# Patient Record
Sex: Male | Born: 1945
Health system: Southern US, Community
[De-identification: ages and names within clinical notes are randomized; demographics above are authoritative.]

## PROBLEM LIST (undated history)

## (undated) DIAGNOSIS — G459 Transient cerebral ischemic attack, unspecified: Secondary | ICD-10-CM

## (undated) DIAGNOSIS — E119 Type 2 diabetes mellitus without complications: Secondary | ICD-10-CM

## (undated) DIAGNOSIS — I10 Essential (primary) hypertension: Secondary | ICD-10-CM

## (undated) DIAGNOSIS — E785 Hyperlipidemia, unspecified: Secondary | ICD-10-CM

## (undated) DIAGNOSIS — E079 Disorder of thyroid, unspecified: Secondary | ICD-10-CM

## (undated) DIAGNOSIS — I499 Cardiac arrhythmia, unspecified: Secondary | ICD-10-CM

## (undated) HISTORY — DX: Type 2 diabetes mellitus without complications: E11.9

## (undated) HISTORY — PX: APPENDECTOMY: SHX54

## (undated) HISTORY — DX: Essential (primary) hypertension: I10

## (undated) HISTORY — PX: CHOLECYSTECTOMY: SHX55

## (undated) HISTORY — DX: Transient cerebral ischemic attack, unspecified: G45.9

## (undated) HISTORY — DX: Disorder of thyroid, unspecified: E07.9

## (undated) HISTORY — DX: Cardiac arrhythmia, unspecified: I49.9

## (undated) HISTORY — DX: Hyperlipidemia, unspecified: E78.5

---

## 1998-06-12 ENCOUNTER — Encounter: Payer: Self-pay | Admitting: Emergency Medicine

## 1998-06-12 ENCOUNTER — Observation Stay (HOSPITAL_COMMUNITY): Admission: EM | Admit: 1998-06-12 | Discharge: 1998-06-13 | Payer: Self-pay | Admitting: Emergency Medicine

## 1998-06-18 ENCOUNTER — Encounter: Admission: RE | Admit: 1998-06-18 | Discharge: 1998-06-18 | Payer: Self-pay | Admitting: Family Medicine

## 2002-05-19 ENCOUNTER — Ambulatory Visit (HOSPITAL_COMMUNITY): Admission: RE | Admit: 2002-05-19 | Discharge: 2002-05-19 | Payer: Self-pay | Admitting: Gastroenterology

## 2005-01-07 ENCOUNTER — Ambulatory Visit: Payer: Self-pay | Admitting: Cardiology

## 2008-08-18 LAB — HM COLONOSCOPY

## 2010-07-08 ENCOUNTER — Encounter: Payer: Self-pay | Admitting: Family Medicine

## 2010-07-08 DIAGNOSIS — I499 Cardiac arrhythmia, unspecified: Secondary | ICD-10-CM

## 2010-07-08 DIAGNOSIS — G459 Transient cerebral ischemic attack, unspecified: Secondary | ICD-10-CM | POA: Insufficient documentation

## 2010-07-08 DIAGNOSIS — I10 Essential (primary) hypertension: Secondary | ICD-10-CM | POA: Insufficient documentation

## 2010-07-08 DIAGNOSIS — N400 Enlarged prostate without lower urinary tract symptoms: Secondary | ICD-10-CM | POA: Insufficient documentation

## 2010-07-08 DIAGNOSIS — E785 Hyperlipidemia, unspecified: Secondary | ICD-10-CM

## 2012-07-08 ENCOUNTER — Other Ambulatory Visit (INDEPENDENT_AMBULATORY_CARE_PROVIDER_SITE_OTHER): Payer: BC Managed Care – PPO

## 2012-07-08 DIAGNOSIS — E559 Vitamin D deficiency, unspecified: Secondary | ICD-10-CM

## 2012-07-08 DIAGNOSIS — E119 Type 2 diabetes mellitus without complications: Secondary | ICD-10-CM

## 2012-07-08 DIAGNOSIS — I10 Essential (primary) hypertension: Secondary | ICD-10-CM

## 2012-07-08 DIAGNOSIS — E785 Hyperlipidemia, unspecified: Secondary | ICD-10-CM

## 2012-07-08 DIAGNOSIS — R5381 Other malaise: Secondary | ICD-10-CM

## 2012-07-08 DIAGNOSIS — Z1212 Encounter for screening for malignant neoplasm of rectum: Secondary | ICD-10-CM

## 2012-07-08 LAB — POCT CBC
Granulocyte percent: 62.9 %G (ref 37–80)
Lymph, poc: 2.1 (ref 0.6–3.4)
MCV: 86.6 fL (ref 80–97)
Platelet Count, POC: 323 10*3/uL (ref 142–424)
RDW, POC: 13.2 %
WBC: 6.6 10*3/uL (ref 4.6–10.2)

## 2012-07-08 LAB — HEPATIC FUNCTION PANEL
Albumin: 4.4 g/dL (ref 3.5–5.2)
Total Bilirubin: 0.7 mg/dL (ref 0.3–1.2)

## 2012-07-08 LAB — BASIC METABOLIC PANEL
BUN: 20 mg/dL (ref 6–23)
Calcium: 9.5 mg/dL (ref 8.4–10.5)
Glucose, Bld: 145 mg/dL — ABNORMAL HIGH (ref 70–99)
Sodium: 138 mEq/L (ref 135–145)

## 2012-07-08 NOTE — Progress Notes (Signed)
Pt for labs only

## 2012-07-09 LAB — FECAL OCCULT BLOOD, IMMUNOCHEMICAL: Fecal Occult Blood: NEGATIVE

## 2012-07-09 LAB — VITAMIN D 25 HYDROXY (VIT D DEFICIENCY, FRACTURES): Vit D, 25-Hydroxy: 50 ng/mL (ref 30–89)

## 2012-07-09 LAB — NMR, LIPOPROFILE

## 2012-07-19 ENCOUNTER — Encounter: Payer: Self-pay | Admitting: Family Medicine

## 2012-08-02 ENCOUNTER — Ambulatory Visit (INDEPENDENT_AMBULATORY_CARE_PROVIDER_SITE_OTHER): Payer: BC Managed Care – PPO | Admitting: Family Medicine

## 2012-08-02 ENCOUNTER — Encounter: Payer: Self-pay | Admitting: Family Medicine

## 2012-08-02 VITALS — BP 132/81 | HR 88 | Temp 97.0°F | Ht 70.0 in | Wt 184.6 lb

## 2012-08-02 DIAGNOSIS — E119 Type 2 diabetes mellitus without complications: Secondary | ICD-10-CM

## 2012-08-02 DIAGNOSIS — N400 Enlarged prostate without lower urinary tract symptoms: Secondary | ICD-10-CM

## 2012-08-02 DIAGNOSIS — E1169 Type 2 diabetes mellitus with other specified complication: Secondary | ICD-10-CM | POA: Insufficient documentation

## 2012-08-02 DIAGNOSIS — E785 Hyperlipidemia, unspecified: Secondary | ICD-10-CM

## 2012-08-02 DIAGNOSIS — I10 Essential (primary) hypertension: Secondary | ICD-10-CM

## 2012-08-02 MED ORDER — VALSARTAN-HYDROCHLOROTHIAZIDE 320-25 MG PO TABS: 1.0000 | ORAL_TABLET | Freq: Every day | ORAL | Status: DC

## 2012-08-02 MED ORDER — EZETIMIBE 10 MG PO TABS: 10.0000 mg | ORAL_TABLET | Freq: Every day | ORAL | Status: DC

## 2012-08-02 NOTE — Patient Instructions (Addendum)
Study continue current meds and therapy lifestyle changes Fall prevention Monitor blood sugars and blood pressure at home

## 2012-08-02 NOTE — Progress Notes (Signed)
  Subjective:    Patient ID: Matthew Daugherty, male    DOB: 09-08-1945, 67 y.o.   MRN: [id number]  HPI This patient presents for recheck of multiple medical problems. No one accompanies the patient today.  Patient Active Problem List  Diagnosis  . Essential hypertension, benign  . Cardiac dysrhythmia, unspecified  . BPH (benign prostatic hyperplasia)  . Hyperlipemia    In addition,See ROS  The allergies, current medications, past medical history, surgical history, family and social history are reviewed.  Immunizations reviewed.  Health maintenance reviewed.  The following items are outstanding: none.      Review of Systems  Constitutional: Negative.   HENT: Negative.   Eyes: Negative.   Respiratory: Negative.   Cardiovascular: Negative.   Gastrointestinal: Negative.   Endocrine: Negative.   Genitourinary: Negative.   Musculoskeletal: Negative.   Skin: Negative.   Allergic/Immunologic: Positive for environmental allergies (sesonal).  Neurological: Negative.   Psychiatric/Behavioral: Negative.        Objective:   Physical Exam BP 132/81  Pulse 88  Temp(Src) 97 F (36.1 C) (Oral)  Ht 5\' 10"  (1.778 m)  Wt 184 lb 9.6 oz (83.734 kg)  BMI 26.49 kg/m2  The patient appeared well nourished and normally developed, alert and oriented to time and place. Speech, behavior and judgement appear normal. Vital signs as documented.  Head exam is unremarkable. No scleral icterus or pallor noted.  Neck is without jugular venous distension, thyromegally, or carotid bruits. Carotid upstrokes are brisk bilaterally. No cervical adenopathy. Lungs are clear anteriorly and posteriorly to auscultation. Normal respiratory effort. Cardiac exam reveals regular rate and rhythm. First and second heart sounds normal. No murmurs, rubs or gallops.  Abdominal exam reveals normal bowl sounds, no masses, no organomegaly and no aortic enlargement. No inguinal adenopathy. Prostate gland slightly  enlarged. No lumps. No rectal masses. No testicular masses. No hernia. Extremities are nonedematous and both femoral and pedal pulses are normal. Skin without pallor or jaundice.  Warm and dry, without rash. Neurologic exam reveals normal deep tendon reflexes and normal sensation.          Assessment & Plan:  1. Hyperlipemia  2. Essential hypertension, benign  3. BPH (benign prostatic hyperplasia) - PSA  4. Diabetes Mellitus --Good control  5. Return to clinic in 4 months --Labs prior to the visit

## 2012-08-03 LAB — PSA: PSA: 1.57 ng/mL (ref <=4.00)

## 2012-11-03 ENCOUNTER — Other Ambulatory Visit (INDEPENDENT_AMBULATORY_CARE_PROVIDER_SITE_OTHER): Payer: BC Managed Care – PPO

## 2012-11-03 DIAGNOSIS — R5381 Other malaise: Secondary | ICD-10-CM

## 2012-11-03 DIAGNOSIS — R7309 Other abnormal glucose: Secondary | ICD-10-CM

## 2012-11-03 DIAGNOSIS — E559 Vitamin D deficiency, unspecified: Secondary | ICD-10-CM

## 2012-11-03 DIAGNOSIS — I10 Essential (primary) hypertension: Secondary | ICD-10-CM

## 2012-11-03 DIAGNOSIS — E785 Hyperlipidemia, unspecified: Secondary | ICD-10-CM

## 2012-11-03 DIAGNOSIS — R5383 Other fatigue: Secondary | ICD-10-CM

## 2012-11-03 LAB — HEPATIC FUNCTION PANEL
ALT: 42 U/L (ref 0–53)
AST: 36 U/L (ref 0–37)
Alkaline Phosphatase: 44 U/L (ref 39–117)
Bilirubin, Direct: 0.2 mg/dL (ref 0.0–0.3)
Total Bilirubin: 0.8 mg/dL (ref 0.3–1.2)

## 2012-11-03 LAB — BASIC METABOLIC PANEL WITH GFR
CO2: 25 mEq/L (ref 19–32)
Chloride: 101 mEq/L (ref 96–112)
Creat: 1.2 mg/dL (ref 0.50–1.35)
Glucose, Bld: 129 mg/dL — ABNORMAL HIGH (ref 70–99)

## 2012-11-03 LAB — POCT CBC
HCT, POC: 37.8 % — AB (ref 43.5–53.7)
Hemoglobin: 13.8 g/dL — AB (ref 14.1–18.1)
MCH, POC: 32.2 pg — AB (ref 27–31.2)
MPV: 7.8 fL (ref 0–99.8)
POC LYMPH PERCENT: 31.3 %L (ref 10–50)
RBC: 4.3 M/uL — AB (ref 4.69–6.13)

## 2012-11-04 LAB — NMR LIPOPROFILE WITH LIPIDS
Cholesterol, Total: 94 mg/dL (ref <200)
Large HDL-P: 3.1 umol/L — ABNORMAL LOW (ref >=4.8)
Large VLDL-P: 2.7 nmol/L (ref <=2.7)
Triglycerides: 75 mg/dL (ref <150)

## 2012-11-07 NOTE — Addendum Note (Signed)
Addended by: Monica Becton on: 11/07/2012 06:56 PM   Modules accepted: Orders

## 2012-11-08 LAB — POCT GLYCOSYLATED HEMOGLOBIN (HGB A1C): Hemoglobin A1C: 6.7

## 2012-11-09 ENCOUNTER — Telehealth: Payer: Self-pay | Admitting: *Deleted

## 2012-11-09 NOTE — Telephone Encounter (Signed)
Message copied by Bearl Mulberry on Wed Nov 09, 2012  3:18 PM ------      Message from: Ernestina Penna      Created: Tue Nov 08, 2012  4:20 PM       Hemoglobin A1c is 6.7% and this indicates good blood sugar control. ------

## 2012-11-09 NOTE — Telephone Encounter (Signed)
Pt notified of results

## 2012-12-05 ENCOUNTER — Encounter: Payer: Self-pay | Admitting: Family Medicine

## 2012-12-05 ENCOUNTER — Ambulatory Visit (INDEPENDENT_AMBULATORY_CARE_PROVIDER_SITE_OTHER): Payer: BC Managed Care – PPO | Admitting: Family Medicine

## 2012-12-05 VITALS — BP 135/79 | HR 80 | Temp 97.6°F | Ht 70.0 in | Wt 184.2 lb

## 2012-12-05 DIAGNOSIS — E039 Hypothyroidism, unspecified: Secondary | ICD-10-CM | POA: Insufficient documentation

## 2012-12-05 DIAGNOSIS — I10 Essential (primary) hypertension: Secondary | ICD-10-CM

## 2012-12-05 DIAGNOSIS — R5381 Other malaise: Secondary | ICD-10-CM

## 2012-12-05 DIAGNOSIS — E785 Hyperlipidemia, unspecified: Secondary | ICD-10-CM

## 2012-12-05 DIAGNOSIS — E119 Type 2 diabetes mellitus without complications: Secondary | ICD-10-CM

## 2012-12-05 DIAGNOSIS — R5383 Other fatigue: Secondary | ICD-10-CM

## 2012-12-05 DIAGNOSIS — E559 Vitamin D deficiency, unspecified: Secondary | ICD-10-CM

## 2012-12-05 MED ORDER — ATORVASTATIN CALCIUM 80 MG PO TABS: 80.0000 mg | ORAL_TABLET | Freq: Every day | ORAL | Status: DC

## 2012-12-05 MED ORDER — METFORMIN HCL ER (OSM) 500 MG PO TB24: 500.0000 mg | ORAL_TABLET | Freq: Two times a day (BID) | ORAL | Status: DC

## 2012-12-05 MED ORDER — CHOLINE FENOFIBRATE 135 MG PO CPDR: 135.0000 mg | DELAYED_RELEASE_CAPSULE | Freq: Every day | ORAL | Status: DC

## 2012-12-05 MED ORDER — LEVOTHYROXINE SODIUM 75 MCG PO TABS: 75.0000 ug | ORAL_TABLET | Freq: Every day | ORAL | Status: DC

## 2012-12-05 NOTE — Progress Notes (Signed)
  Subjective:    Patient ID: Matthew Daugherty, male    DOB: 21-Jul-1945, 67 y.o.   MRN: [id number]  HPI Dorene Sorrow returns to clinic today for followup and management of chronic medical problems. These include hypertension, hyperlipidemia, hypothyroidism, type 2 diabetes mellitus and vitamin D deficiency. He also has a history of BPH . He is due a  Pneumovax, eye exam, and urine microalbumin. Patient notes he is planning to retired in January 2015.   Review of Systems  Constitutional: Positive for fatigue (slight).  HENT: Negative for ear pain, congestion, sore throat, rhinorrhea, sneezing, postnasal drip and sinus pressure.   Eyes: Negative.  Negative for photophobia, pain, discharge, redness, itching and visual disturbance.  Respiratory: Negative.  Negative for cough, choking, chest tightness, shortness of breath and wheezing.   Cardiovascular: Negative for chest pain, palpitations and leg swelling.  Gastrointestinal: Negative.  Negative for nausea, vomiting, abdominal pain, diarrhea, constipation and blood in stool.  Endocrine: Negative.  Negative for cold intolerance, heat intolerance, polydipsia, polyphagia and polyuria.  Genitourinary: Negative.  Negative for dysuria, urgency, frequency, hematuria, penile pain and testicular pain.  Musculoskeletal: Positive for arthralgias (L  shoulder). Negative for myalgias, back pain and joint swelling.  Skin: Negative.  Negative for color change, pallor, rash and wound.  Allergic/Immunologic: Negative.  Negative for environmental allergies.  Neurological: Negative.  Negative for dizziness, tremors, syncope, weakness, light-headedness, numbness and headaches.  Hematological: Negative.  Does not bruise/bleed easily.  Psychiatric/Behavioral: Negative.  Negative for confusion, sleep disturbance, decreased concentration and agitation. The patient is not nervous/anxious.        Objective:   Physical Exam BP 135/79  Pulse 80  Temp(Src) 97.6 F (36.4 C) (Oral)   Ht 5\' 10"  (1.778 m)  Wt 184 lb 3.2 oz (83.553 kg)  BMI 26.43 kg/m2  The patient appeared well nourished and normally developed, alert and oriented to time and place. Speech, behavior and judgement appear normal. Vital signs as documented.  Head exam is unremarkable. No scleral icterus or pallor noted. Ears nose and throat are all within normal Neck is without jugular venous distension, thyromegally, or carotid bruits. Carotid upstrokes are brisk bilaterally. No cervical adenopathy. Lungs are clear anteriorly and posteriorly to auscultation. Normal respiratory effort. Cardiac exam reveals regular rate and rhythm at 72 per minute. First and second heart sounds normal.  No murmurs, rubs or gallops.  Abdominal exam reveals normal bowl sounds, no masses, no organomegaly and no aortic enlargement. No inguinal adenopathy. There is no abdominal tenderness. Extremities are nonedematous and both femoral and pedal pulses are normal. Skin without pallor or jaundice.  Warm and dry, without rash. Neurologic exam reveals normal deep tendon reflexes and normal sensation. A diabetic foot exam is done today.          Assessment & Plan:  1. Hypertension - Hepatic function panel; Standing - NMR, lipoprofile; Standing  2. Hyperlipemia  3. Type 2 diabetes mellitus - BMP8+EGFR - Microalbumin, urine; Standing - POCT glycosylated hemoglobin (Hb A1C); Standing  4. Vitamin D deficiency - Vit D25 hydroxy(osteoporosis monitoring); Standing  5. Hypothyroid  6. Fatigue - POCT CBC  Patient Instructions  Fall precautions discussed Continue current meds and therapeutic lifestyle changes Return to clinic in September or October for a flu shot We will give you your pneumonia shot when you're 67   Nyra Capes MD

## 2012-12-05 NOTE — Patient Instructions (Addendum)
Fall precautions discussed Continue current meds and therapeutic lifestyle changes Return to clinic in September or October for a flu shot We will give you your pneumonia shot when you're 67

## 2013-03-08 ENCOUNTER — Other Ambulatory Visit (INDEPENDENT_AMBULATORY_CARE_PROVIDER_SITE_OTHER): Payer: BC Managed Care – PPO

## 2013-03-08 DIAGNOSIS — E559 Vitamin D deficiency, unspecified: Secondary | ICD-10-CM

## 2013-03-08 DIAGNOSIS — E119 Type 2 diabetes mellitus without complications: Secondary | ICD-10-CM

## 2013-03-08 DIAGNOSIS — I10 Essential (primary) hypertension: Secondary | ICD-10-CM

## 2013-03-08 NOTE — Progress Notes (Signed)
Patient came in for labs only.

## 2013-03-10 LAB — NMR, LIPOPROFILE
Cholesterol: 115 mg/dL (ref <200)
HDL Particle Number: 38.4 umol/L (ref >=30.5)
LDL Size: 20.3 nm — ABNORMAL LOW (ref >20.5)
LP-IR Score: 57 — ABNORMAL HIGH (ref <=45)
Small LDL Particle Number: 835 nmol/L — ABNORMAL HIGH (ref <=527)

## 2013-03-10 LAB — HEPATIC FUNCTION PANEL
AST: 36 IU/L (ref 0–40)
Albumin: 5 g/dL — ABNORMAL HIGH (ref 3.6–4.8)
Alkaline Phosphatase: 49 IU/L (ref 39–117)
Bilirubin, Direct: 0.22 mg/dL (ref 0.00–0.40)
Total Protein: 6.8 g/dL (ref 6.0–8.5)

## 2013-03-10 LAB — MICROALBUMIN, URINE: Microalbumin, Urine: 3 ug/mL (ref 0.0–17.0)

## 2013-04-11 ENCOUNTER — Ambulatory Visit (INDEPENDENT_AMBULATORY_CARE_PROVIDER_SITE_OTHER): Payer: Medicare Other | Admitting: Family Medicine

## 2013-04-11 ENCOUNTER — Encounter: Payer: Self-pay | Admitting: Family Medicine

## 2013-04-11 ENCOUNTER — Ambulatory Visit (INDEPENDENT_AMBULATORY_CARE_PROVIDER_SITE_OTHER): Payer: Medicare Other

## 2013-04-11 VITALS — BP 128/73 | HR 78 | Temp 97.2°F | Ht 70.0 in | Wt 180.0 lb

## 2013-04-11 DIAGNOSIS — E039 Hypothyroidism, unspecified: Secondary | ICD-10-CM | POA: Diagnosis not present

## 2013-04-11 DIAGNOSIS — Z23 Encounter for immunization: Secondary | ICD-10-CM

## 2013-04-11 DIAGNOSIS — I10 Essential (primary) hypertension: Secondary | ICD-10-CM

## 2013-04-11 DIAGNOSIS — E119 Type 2 diabetes mellitus without complications: Secondary | ICD-10-CM | POA: Diagnosis not present

## 2013-04-11 DIAGNOSIS — E785 Hyperlipidemia, unspecified: Secondary | ICD-10-CM | POA: Diagnosis not present

## 2013-04-11 DIAGNOSIS — E559 Vitamin D deficiency, unspecified: Secondary | ICD-10-CM

## 2013-04-11 DIAGNOSIS — N400 Enlarged prostate without lower urinary tract symptoms: Secondary | ICD-10-CM

## 2013-04-11 NOTE — Patient Instructions (Addendum)
Continue current medications. Continue good therapeutic lifestyle changes which include good diet and exercise. Fall precautions discussed with patient. Schedule your flu vaccine if you haven't had it yet If you are over 65 years old - you may need Prevnar 13 or the adult Pneumonia vaccine. Continue to exercise regularly and monitor blood sugar This winter drink plenty of fluids use a cool mist humidifier in her bedroom at nighttime

## 2013-04-11 NOTE — Progress Notes (Addendum)
Subjective:    Patient ID: Matthew Daugherty, male    DOB: [birth year], 67 y.o.   MRN: [id number]  HPI Pt here for follow up and management of chronic medical problems. The patient has been doing well with no particular complaints or problems. He is up-to-date on his health maintenance except for getting a Prevnar vaccination. He would be due for his colonoscopy in May 2015. He plans to retire at the end of January 2015.       Patient Active Problem List   Diagnosis Date Noted  . Hypothyroid 12/05/2012  . Vitamin D deficiency 12/05/2012  . Type 2 diabetes mellitus 12/05/2012  . Hyperlipemia 08/02/2012  . Hypertension   . Cardiac dysrhythmia, unspecified, history of   . BPH (benign prostatic hyperplasia)    Outpatient Encounter Prescriptions as of 04/11/2013  Medication Sig  . aspirin 81 MG EC tablet Take 81 mg by mouth daily.    Marland Kitchen atorvastatin (LIPITOR) 80 MG tablet Take 1 tablet (80 mg total) by mouth daily.  . Cholecalciferol (VITAMIN D-3) 5000 UNITS TABS Take 1 tablet by mouth daily.    . Choline Fenofibrate (TRILIPIX) 135 MG capsule Take 1 capsule (135 mg total) by mouth daily.  Marland Kitchen ezetimibe (ZETIA) 10 MG tablet Take 1 tablet (10 mg total) by mouth daily.  Marland Kitchen levothyroxine (SYNTHROID, LEVOTHROID) 75 MCG tablet Take 1 tablet (75 mcg total) by mouth daily before breakfast.  . metformin (FORTAMET) 500 MG (OSM) 24 hr tablet Take 1 tablet (500 mg total) by mouth 2 (two) times daily with a meal.  . Multiple Vitamin (MULTIVITAMIN WITH MINERALS) TABS tablet Take 1 tablet by mouth daily.  . valsartan-hydrochlorothiazide (DIOVAN-HCT) 320-25 MG per tablet Take 1 tablet by mouth daily.    Review of Systems  Constitutional: Negative.   HENT: Negative.   Eyes: Negative.   Respiratory: Negative.   Cardiovascular: Negative.   Gastrointestinal: Negative.   Endocrine: Negative.   Genitourinary: Negative.   Musculoskeletal: Negative.   Skin: Negative.   Allergic/Immunologic: Negative.     Neurological: Negative.   Hematological: Negative.   Psychiatric/Behavioral: Negative.        Objective:   Physical Exam  Nursing note and vitals reviewed. Constitutional: He is oriented to person, place, and time. He appears well-developed and well-nourished. No distress.  HENT:  Head: Normocephalic and atraumatic.  Right Ear: External ear normal.  Left Ear: External ear normal.  Nose: Nose normal.  Mouth/Throat: Oropharynx is clear and moist. No oropharyngeal exudate.  Eyes: Conjunctivae and EOM are normal. Pupils are equal, round, and reactive to light. Right eye exhibits no discharge. Left eye exhibits no discharge. No scleral icterus.  Neck: Normal range of motion. Neck supple. No tracheal deviation present. No thyromegaly present.  No carotid bruits  Cardiovascular: Normal rate, regular rhythm, normal heart sounds and intact distal pulses.  Exam reveals no gallop and no friction rub.   No murmur heard. At 72 per minute  Pulmonary/Chest: Effort normal and breath sounds normal. No respiratory distress. He has no wheezes. He has no rales. He exhibits no tenderness.  No axillary nodes  Abdominal: Soft. Bowel sounds are normal. He exhibits no mass. There is no tenderness. There is no rebound and no guarding.  Musculoskeletal: Normal range of motion. He exhibits no edema and no tenderness.  Lymphadenopathy:    He has no cervical adenopathy.  Neurological: He is alert and oriented to person, place, and time. He has normal reflexes. No cranial nerve deficit.  Skin: Skin is warm and dry. No rash noted. No erythema. No pallor.  Psychiatric: He has a normal mood and affect. His behavior is normal. Judgment and thought content normal.   BP 128/73  Pulse 78  Temp(Src) 97.2 F (36.2 C) (Oral)  Ht 5\' 10"  (1.778 m)  Wt 180 lb (81.647 kg)  BMI 25.83 kg/m2 [facility] reading (PRIMARY) by  DrMoore;-chest x-ray --  No active disease, degenerative changes in thoracic spine                                       Assessment & Plan:  1. Hyperlipemia  2. Hypertension - DG Chest 2 View; Future - BMP8+EGFR - Basic Metabolic Panel  3. Hypothyroid  4. Type 2 diabetes mellitus - BMP8+EGFR  5. Vitamin D deficiency  6. BPH (benign prostatic hyperplasia)  Orders Placed This Encounter  Procedures  . DG Chest 2 View    Standing Status: Future     Number of Occurrences:      Standing Expiration Date: 06/11/2014    Order Specific Question:  Reason for Exam (SYMPTOM  OR DIAGNOSIS REQUIRED)    Answer:  htn    Order Specific Question:  Preferred imaging location?    Answer:  Internal  . BMP8+EGFR  . Basic Metabolic Panel   Meds ordered this encounter  Medications  . Multiple Vitamin (MULTIVITAMIN WITH MINERALS) TABS tablet    Sig: Take 1 tablet by mouth daily.   Patient Instructions  Continue current medications. Continue good therapeutic lifestyle changes which include good diet and exercise. Fall precautions discussed with patient. Schedule your flu vaccine if you haven't had it yet If you are over 79 years old - you may need Prevnar 13 or the adult Pneumonia vaccine. Continue to exercise regularly and monitor blood sugar This winter drink plenty of fluids use a cool mist humidifier in her bedroom at nighttime   Nyra Capes MD

## 2013-04-11 NOTE — Addendum Note (Signed)
Addended by: Bernita Buffy on: 04/11/2013 03:59 PM   Modules accepted: Orders

## 2013-04-12 LAB — BMP8+EGFR
BUN/Creatinine Ratio: 13 (ref 10–22)
GFR calc Af Amer: 69 mL/min/{1.73_m2} (ref >59)
GFR calc non Af Amer: 60 mL/min/{1.73_m2} (ref >59)
Potassium: 4.9 mmol/L (ref 3.5–5.2)
Sodium: 140 mmol/L (ref 134–144)

## 2013-06-19 ENCOUNTER — Other Ambulatory Visit: Payer: Self-pay | Admitting: Family Medicine

## 2013-06-19 ENCOUNTER — Telehealth: Payer: Self-pay | Admitting: Family Medicine

## 2013-06-19 MED ORDER — METFORMIN HCL ER (OSM) 500 MG PO TB24: 500.0000 mg | ORAL_TABLET | Freq: Two times a day (BID) | ORAL | Status: DC

## 2013-06-19 MED ORDER — CHOLINE FENOFIBRATE 135 MG PO CPDR: 135.0000 mg | DELAYED_RELEASE_CAPSULE | Freq: Every day | ORAL | Status: DC

## 2013-06-19 NOTE — Telephone Encounter (Signed)
Patient aware.

## 2013-06-19 NOTE — Telephone Encounter (Signed)
Taken care of in refill encounter.

## 2013-06-20 ENCOUNTER — Telehealth: Payer: Self-pay | Admitting: Family Medicine

## 2013-06-21 MED ORDER — METFORMIN HCL ER 500 MG PO TB24: 500.0000 mg | ORAL_TABLET | Freq: Two times a day (BID) | ORAL | Status: DC

## 2013-06-21 MED ORDER — FENOFIBRATE 160 MG PO TABS: 160.0000 mg | ORAL_TABLET | Freq: Every day | ORAL | Status: DC

## 2013-06-21 NOTE — Telephone Encounter (Signed)
Spoke with Stew at [facility] - due to formulary trilipex and fortamet are very expensive (over $100 each).  Changed trilipex to fenofibrate to 160mg  1 qd and fortamet to metfromin ER 500mg  1 bid.

## 2013-07-06 ENCOUNTER — Other Ambulatory Visit: Payer: Self-pay | Admitting: Family Medicine

## 2013-07-20 ENCOUNTER — Other Ambulatory Visit (INDEPENDENT_AMBULATORY_CARE_PROVIDER_SITE_OTHER): Payer: Medicare Other

## 2013-07-20 DIAGNOSIS — E559 Vitamin D deficiency, unspecified: Secondary | ICD-10-CM

## 2013-07-20 DIAGNOSIS — E119 Type 2 diabetes mellitus without complications: Secondary | ICD-10-CM | POA: Diagnosis not present

## 2013-07-20 DIAGNOSIS — I10 Essential (primary) hypertension: Secondary | ICD-10-CM

## 2013-07-20 LAB — POCT GLYCOSYLATED HEMOGLOBIN (HGB A1C): HEMOGLOBIN A1C: 7.5

## 2013-07-20 NOTE — Progress Notes (Signed)
Pt came in for labs only 

## 2013-07-21 LAB — VITAMIN D 25 HYDROXY (VIT D DEFICIENCY, FRACTURES): Vit D, 25-Hydroxy: 40.2 ng/mL (ref 30.0–100.0)

## 2013-07-21 LAB — HEPATIC FUNCTION PANEL
ALK PHOS: 57 IU/L (ref 39–117)
ALT: 31 IU/L (ref 0–44)
AST: 26 IU/L (ref 0–40)
Albumin: 4.8 g/dL (ref 3.6–4.8)
BILIRUBIN TOTAL: 0.4 mg/dL (ref 0.0–1.2)
Bilirubin, Direct: 0.2 mg/dL (ref 0.00–0.40)
TOTAL PROTEIN: 6.7 g/dL (ref 6.0–8.5)

## 2013-07-21 LAB — NMR, LIPOPROFILE
CHOLESTEROL: 116 mg/dL (ref <200)
HDL Cholesterol by NMR: 43 mg/dL (ref >=40)
HDL Particle Number: 37.6 umol/L (ref >=30.5)
LDL Particle Number: 1060 nmol/L — ABNORMAL HIGH (ref <1000)
LDL SIZE: 20.1 nm — AB (ref >20.5)
LDLC SERPL CALC-MCNC: 53 mg/dL (ref <100)
LP-IR Score: 73 — ABNORMAL HIGH (ref <=45)
Small LDL Particle Number: 707 nmol/L — ABNORMAL HIGH (ref <=527)
Triglycerides by NMR: 98 mg/dL (ref <150)

## 2013-07-21 LAB — MICROALBUMIN, URINE: Microalbumin, Urine: <3 ug/mL (ref 0.0–17.0)

## 2013-07-24 ENCOUNTER — Other Ambulatory Visit: Payer: No Typology Code available for payment source

## 2013-07-24 NOTE — Progress Notes (Signed)
Pt dropped off FOBT  

## 2013-07-25 LAB — FECAL OCCULT BLOOD, IMMUNOCHEMICAL

## 2013-07-31 ENCOUNTER — Encounter: Payer: Self-pay | Admitting: *Deleted

## 2013-08-04 ENCOUNTER — Telehealth: Payer: Self-pay | Admitting: Family Medicine

## 2013-08-04 MED ORDER — ATORVASTATIN CALCIUM 80 MG PO TABS: 80.0000 mg | ORAL_TABLET | Freq: Every day | ORAL | Status: DC

## 2013-08-04 NOTE — Telephone Encounter (Signed)
done

## 2013-08-09 ENCOUNTER — Ambulatory Visit (INDEPENDENT_AMBULATORY_CARE_PROVIDER_SITE_OTHER): Payer: Medicare Other

## 2013-08-09 ENCOUNTER — Ambulatory Visit (INDEPENDENT_AMBULATORY_CARE_PROVIDER_SITE_OTHER): Payer: Medicare Other | Admitting: Family Medicine

## 2013-08-09 ENCOUNTER — Encounter: Payer: Self-pay | Admitting: Family Medicine

## 2013-08-09 VITALS — BP 143/88 | HR 103 | Temp 99.1°F | Ht 70.0 in | Wt 179.0 lb

## 2013-08-09 DIAGNOSIS — M7580 Other shoulder lesions, unspecified shoulder: Secondary | ICD-10-CM

## 2013-08-09 DIAGNOSIS — N400 Enlarged prostate without lower urinary tract symptoms: Secondary | ICD-10-CM

## 2013-08-09 DIAGNOSIS — M25519 Pain in unspecified shoulder: Secondary | ICD-10-CM | POA: Diagnosis not present

## 2013-08-09 DIAGNOSIS — M25512 Pain in left shoulder: Secondary | ICD-10-CM

## 2013-08-09 DIAGNOSIS — E559 Vitamin D deficiency, unspecified: Secondary | ICD-10-CM

## 2013-08-09 DIAGNOSIS — E785 Hyperlipidemia, unspecified: Secondary | ICD-10-CM | POA: Diagnosis not present

## 2013-08-09 DIAGNOSIS — I10 Essential (primary) hypertension: Secondary | ICD-10-CM | POA: Diagnosis not present

## 2013-08-09 DIAGNOSIS — E119 Type 2 diabetes mellitus without complications: Secondary | ICD-10-CM | POA: Diagnosis not present

## 2013-08-09 DIAGNOSIS — M25819 Other specified joint disorders, unspecified shoulder: Secondary | ICD-10-CM

## 2013-08-09 DIAGNOSIS — M25612 Stiffness of left shoulder, not elsewhere classified: Secondary | ICD-10-CM

## 2013-08-09 NOTE — Patient Instructions (Addendum)
Medicare Annual Wellness Visit  [facility] and the medical providers at [facility] strive to bring you the best medical care.  In doing so we not only want to address your current medical conditions and concerns but also to detect new conditions early and prevent illness, disease and health-related problems.    Medicare offers a yearly Wellness Visit which allows our clinical staff to assess your need for preventative services including immunizations, lifestyle education, counseling to decrease risk of preventable diseases and screening for fall risk and other medical concerns.    This visit is provided free of charge (no copay) for all Medicare recipients. The clinical pharmacists at [facility] have begun to conduct these Wellness Visits which will also include a thorough review of all your medications.    As you primary medical provider recommend that you make an appointment for your Annual Wellness Visit if you have not done so already this year.  You may set up this appointment before you leave today or you may call back ([phone]) and schedule an appointment.  Please make sure when you call that you mention that you are scheduling your Annual Wellness Visit with the clinical pharmacist so that the appointment may be made for the proper length of time.      Continue current medications. Continue good therapeutic lifestyle changes which include good diet and exercise. Fall precautions discussed with patient. If an FOBT was given today- please return it to our front desk. If you are over 15 years old - you may need Prevnar 36 or the adult Pneumonia vaccine. We will set up MRI of left shoulder and call you  Return in 3-4 months

## 2013-08-09 NOTE — Progress Notes (Signed)
Subjective:    Patient ID: Matthew Daugherty, male    DOB: 05-May-1945, 68 y.o.   MRN: [id number]  HPI Pt here for follow up and management of chronic medical problems. The patient has been retired since January. For about one month he has noticed having more trouble abducting his left arm without pain in the shoulder. He indicates he got Prevnar vaccine in December and had some tingling in his arm after that but this problem with abduction did not occur until about a month ago. He does not recall any injury or accident. He did use his left shoulder a lot at work. Otherwise his review of systems was negative         Patient Active Problem List   Diagnosis Date Noted  . Hypothyroid 12/05/2012  . Vitamin D deficiency 12/05/2012  . Type 2 diabetes mellitus 12/05/2012  . Hyperlipemia 08/02/2012  . Hypertension   . Cardiac dysrhythmia, unspecified, history of   . BPH (benign prostatic hyperplasia)    Outpatient Encounter Prescriptions as of 08/09/2013  Medication Sig  . aspirin 81 MG EC tablet Take 81 mg by mouth daily.    Marland Kitchen atorvastatin (LIPITOR) 80 MG tablet Take 1 tablet (80 mg total) by mouth daily at 6 PM.  . Cholecalciferol (VITAMIN D-3) 5000 UNITS TABS Take 1 tablet by mouth daily.    Marland Kitchen ezetimibe (ZETIA) 10 MG tablet Take 1 tablet (10 mg total) by mouth daily.  . fenofibrate 160 MG tablet Take 1 tablet (160 mg total) by mouth daily.  Marland Kitchen levothyroxine (SYNTHROID, LEVOTHROID) 75 MCG tablet Take 1 tablet (75 mcg total) by mouth daily before breakfast.  . metFORMIN (GLUCOPHAGE-XR) 500 MG 24 hr tablet Take 1 tablet (500 mg total) by mouth 2 (two) times daily.  . Multiple Vitamin (MULTIVITAMIN WITH MINERALS) TABS tablet Take 1 tablet by mouth daily.  . valsartan-hydrochlorothiazide (DIOVAN-HCT) 320-25 MG per tablet Take 1 tablet by mouth daily.    Review of Systems  Constitutional: Negative.   HENT: Negative.   Eyes: Negative.   Respiratory: Negative.   Cardiovascular: Negative.     Gastrointestinal: Negative.   Endocrine: Negative.   Genitourinary: Negative.   Musculoskeletal: Positive for arthralgias (left shoulder pain).  Skin: Negative.   Allergic/Immunologic: Negative.   Neurological: Negative.   Hematological: Negative.   Psychiatric/Behavioral: Negative.        Objective:   Physical Exam  Nursing note and vitals reviewed. Constitutional: He is oriented to person, place, and time. He appears well-developed and well-nourished. No distress.  HENT:  Head: Normocephalic and atraumatic.  Right Ear: External ear normal.  Left Ear: External ear normal.  Nose: Nose normal.  Mouth/Throat: Oropharynx is clear and moist. No oropharyngeal exudate.  Eyes: Conjunctivae and EOM are normal. Pupils are equal, round, and reactive to light. Right eye exhibits no discharge. Left eye exhibits no discharge. No scleral icterus.  Neck: Normal range of motion. Neck supple. No thyromegaly present.  No carotid bruits  Cardiovascular: Normal rate, regular rhythm, normal heart sounds and intact distal pulses.  Exam reveals no gallop and no friction rub.   No murmur heard. At 84 per minute  Pulmonary/Chest: Effort normal and breath sounds normal. No respiratory distress. He has no wheezes. He has no rales. He exhibits no tenderness.  Abdominal: Soft. Bowel sounds are normal. He exhibits no mass. There is no tenderness. There is no rebound and no guarding.  Genitourinary: Rectum normal and penis normal.  The prostate is slightly enlarged,  but soft and smooth. There were no rectal masses. There is no inguinal hernia palpated. There were no inguinal nodes the external genitalia were normal.  Musculoskeletal: Normal range of motion. He exhibits no edema and no tenderness.  Lymphadenopathy:    He has no cervical adenopathy.  Neurological: He is alert and oriented to person, place, and time. He has normal reflexes. No cranial nerve deficit.  Skin: Skin is warm and dry. No rash noted. No  erythema. No pallor.  Psychiatric: He has a normal mood and affect. His behavior is normal. Judgment and thought content normal.   BP 143/88  Pulse 103  Temp(Src) 99.1 F (37.3 C) (Oral)  Ht 5\' 10"  (4.742 m)  Wt 595 lb (81.194 kg)  BMI 25.68 kg/m2  [facility] reading (PRIMARY) by  Dr. Louretta Parma shoulder-no acute finding                                        Assessment & Plan:  1. BPH (benign prostatic hyperplasia) - PSA, total and free  2. Hyperlipemia  3. Hypertension  4. Type 2 diabetes mellitus  5. Vitamin D deficiency  6. Left shoulder pain - DG Shoulder Left; Future - MR Shoulder Left Wo Contrast; Future  7. Decreased abduction of left shoulder joint - MR Shoulder Left Wo Contrast; Future  Patient Instructions                       Medicare Annual Wellness Visit  [facility] and the medical providers at [facility] strive to bring you the best medical care.  In doing so we not only want to address your current medical conditions and concerns but also to detect new conditions early and prevent illness, disease and health-related problems.    Medicare offers a yearly Wellness Visit which allows our clinical staff to assess your need for preventative services including immunizations, lifestyle education, counseling to decrease risk of preventable diseases and screening for fall risk and other medical concerns.    This visit is provided free of charge (no copay) for all Medicare recipients. The clinical pharmacists at [facility] have begun to conduct these Wellness Visits which will also include a thorough review of all your medications.    As you primary medical provider recommend that you make an appointment for your Annual Wellness Visit if you have not done so already this year.  You may set up this appointment before you leave today or you may call back ([phone]) and schedule an appointment.  Please make sure when you call  that you mention that you are scheduling your Annual Wellness Visit with the clinical pharmacist so that the appointment may be made for the proper length of time.      Continue current medications. Continue good therapeutic lifestyle changes which include good diet and exercise. Fall precautions discussed with patient. If an FOBT was given today- please return it to our front desk. If you are over 25 years old - you may need Prevnar 52 or the adult Pneumonia vaccine. We will set up MRI of left shoulder and call you  Return in 3-4 months     Arrie Senate MD

## 2013-08-10 ENCOUNTER — Telehealth: Payer: Self-pay

## 2013-08-10 ENCOUNTER — Ambulatory Visit: Payer: BC Managed Care – PPO | Admitting: Family Medicine

## 2013-08-10 LAB — PSA, TOTAL AND FREE
PSA, Free Pct: 17.6 %
PSA, Free: 0.3 ng/mL
PSA: 1.7 ng/mL (ref 0.0–4.0)

## 2013-08-10 NOTE — Telephone Encounter (Signed)
Message copied by Koren Bound on Thu Aug 10, 2013  8:08 AM ------      Message from: Chipper Herb      Created: Wed Aug 09, 2013  5:50 PM       As per radiology report ------

## 2013-08-10 NOTE — Telephone Encounter (Signed)
Pt aware of lt shoulder x-ray results

## 2013-08-22 ENCOUNTER — Ambulatory Visit (HOSPITAL_COMMUNITY)
Admission: RE | Admit: 2013-08-22 | Discharge: 2013-08-22 | Disposition: A | Discharge status: Home / Self Care | Payer: Medicare Other | Source: Ambulatory Visit | Attending: Family Medicine | Admitting: Family Medicine

## 2013-08-22 DIAGNOSIS — M75100 Unspecified rotator cuff tear or rupture of unspecified shoulder, not specified as traumatic: Secondary | ICD-10-CM | POA: Diagnosis not present

## 2013-08-22 DIAGNOSIS — IMO0002 Reserved for concepts with insufficient information to code with codable children: Secondary | ICD-10-CM | POA: Diagnosis not present

## 2013-08-22 DIAGNOSIS — M6688 Spontaneous rupture of other tendons, other: Secondary | ICD-10-CM | POA: Diagnosis not present

## 2013-08-22 DIAGNOSIS — M19019 Primary osteoarthritis, unspecified shoulder: Secondary | ICD-10-CM | POA: Insufficient documentation

## 2013-08-22 DIAGNOSIS — M25512 Pain in left shoulder: Secondary | ICD-10-CM

## 2013-08-22 DIAGNOSIS — M25612 Stiffness of left shoulder, not elsewhere classified: Secondary | ICD-10-CM

## 2013-08-22 DIAGNOSIS — M249 Joint derangement, unspecified: Secondary | ICD-10-CM | POA: Insufficient documentation

## 2013-08-23 ENCOUNTER — Other Ambulatory Visit: Payer: Self-pay | Admitting: *Deleted

## 2013-08-23 DIAGNOSIS — M25512 Pain in left shoulder: Secondary | ICD-10-CM

## 2013-09-13 DIAGNOSIS — M7500 Adhesive capsulitis of unspecified shoulder: Secondary | ICD-10-CM | POA: Diagnosis not present

## 2013-09-19 ENCOUNTER — Ambulatory Visit: Payer: Medicare Other | Attending: Orthopedic Surgery | Admitting: Physical Therapy

## 2013-09-19 DIAGNOSIS — R5381 Other malaise: Secondary | ICD-10-CM | POA: Insufficient documentation

## 2013-09-19 DIAGNOSIS — M7500 Adhesive capsulitis of unspecified shoulder: Secondary | ICD-10-CM | POA: Insufficient documentation

## 2013-09-19 DIAGNOSIS — M25519 Pain in unspecified shoulder: Secondary | ICD-10-CM | POA: Insufficient documentation

## 2013-09-19 DIAGNOSIS — E119 Type 2 diabetes mellitus without complications: Secondary | ICD-10-CM | POA: Insufficient documentation

## 2013-09-19 DIAGNOSIS — IMO0001 Reserved for inherently not codable concepts without codable children: Secondary | ICD-10-CM | POA: Diagnosis not present

## 2013-09-19 DIAGNOSIS — M25619 Stiffness of unspecified shoulder, not elsewhere classified: Secondary | ICD-10-CM | POA: Insufficient documentation

## 2013-09-19 DIAGNOSIS — I10 Essential (primary) hypertension: Secondary | ICD-10-CM | POA: Insufficient documentation

## 2013-09-22 ENCOUNTER — Ambulatory Visit: Payer: Medicare Other | Admitting: *Deleted

## 2013-09-22 DIAGNOSIS — I10 Essential (primary) hypertension: Secondary | ICD-10-CM | POA: Diagnosis not present

## 2013-09-22 DIAGNOSIS — R5381 Other malaise: Secondary | ICD-10-CM | POA: Diagnosis not present

## 2013-09-22 DIAGNOSIS — M7500 Adhesive capsulitis of unspecified shoulder: Secondary | ICD-10-CM | POA: Diagnosis not present

## 2013-09-22 DIAGNOSIS — M25619 Stiffness of unspecified shoulder, not elsewhere classified: Secondary | ICD-10-CM | POA: Diagnosis not present

## 2013-09-22 DIAGNOSIS — IMO0001 Reserved for inherently not codable concepts without codable children: Secondary | ICD-10-CM | POA: Diagnosis not present

## 2013-09-22 DIAGNOSIS — M25519 Pain in unspecified shoulder: Secondary | ICD-10-CM | POA: Diagnosis not present

## 2013-09-25 ENCOUNTER — Ambulatory Visit: Payer: Medicare Other | Admitting: *Deleted

## 2013-09-25 DIAGNOSIS — R5381 Other malaise: Secondary | ICD-10-CM | POA: Diagnosis not present

## 2013-09-25 DIAGNOSIS — I10 Essential (primary) hypertension: Secondary | ICD-10-CM | POA: Diagnosis not present

## 2013-09-25 DIAGNOSIS — IMO0001 Reserved for inherently not codable concepts without codable children: Secondary | ICD-10-CM | POA: Diagnosis not present

## 2013-09-25 DIAGNOSIS — M25619 Stiffness of unspecified shoulder, not elsewhere classified: Secondary | ICD-10-CM | POA: Diagnosis not present

## 2013-09-25 DIAGNOSIS — M25519 Pain in unspecified shoulder: Secondary | ICD-10-CM | POA: Diagnosis not present

## 2013-09-25 DIAGNOSIS — M7500 Adhesive capsulitis of unspecified shoulder: Secondary | ICD-10-CM | POA: Diagnosis not present

## 2013-09-29 ENCOUNTER — Ambulatory Visit: Payer: Medicare Other | Admitting: *Deleted

## 2013-09-29 DIAGNOSIS — M25619 Stiffness of unspecified shoulder, not elsewhere classified: Secondary | ICD-10-CM | POA: Diagnosis not present

## 2013-09-29 DIAGNOSIS — IMO0001 Reserved for inherently not codable concepts without codable children: Secondary | ICD-10-CM | POA: Diagnosis not present

## 2013-09-29 DIAGNOSIS — R5381 Other malaise: Secondary | ICD-10-CM | POA: Diagnosis not present

## 2013-09-29 DIAGNOSIS — M7500 Adhesive capsulitis of unspecified shoulder: Secondary | ICD-10-CM | POA: Diagnosis not present

## 2013-09-29 DIAGNOSIS — I10 Essential (primary) hypertension: Secondary | ICD-10-CM | POA: Diagnosis not present

## 2013-09-29 DIAGNOSIS — M25519 Pain in unspecified shoulder: Secondary | ICD-10-CM | POA: Diagnosis not present

## 2013-10-02 ENCOUNTER — Ambulatory Visit: Payer: Medicare Other | Admitting: Physical Therapy

## 2013-10-02 DIAGNOSIS — M25619 Stiffness of unspecified shoulder, not elsewhere classified: Secondary | ICD-10-CM | POA: Diagnosis not present

## 2013-10-02 DIAGNOSIS — M25519 Pain in unspecified shoulder: Secondary | ICD-10-CM | POA: Diagnosis not present

## 2013-10-02 DIAGNOSIS — IMO0001 Reserved for inherently not codable concepts without codable children: Secondary | ICD-10-CM | POA: Diagnosis not present

## 2013-10-02 DIAGNOSIS — M7500 Adhesive capsulitis of unspecified shoulder: Secondary | ICD-10-CM | POA: Diagnosis not present

## 2013-10-02 DIAGNOSIS — I10 Essential (primary) hypertension: Secondary | ICD-10-CM | POA: Diagnosis not present

## 2013-10-02 DIAGNOSIS — R5381 Other malaise: Secondary | ICD-10-CM | POA: Diagnosis not present

## 2013-10-04 ENCOUNTER — Ambulatory Visit: Payer: Medicare Other | Admitting: Physical Therapy

## 2013-10-04 DIAGNOSIS — R5381 Other malaise: Secondary | ICD-10-CM | POA: Diagnosis not present

## 2013-10-04 DIAGNOSIS — I10 Essential (primary) hypertension: Secondary | ICD-10-CM | POA: Diagnosis not present

## 2013-10-04 DIAGNOSIS — M25619 Stiffness of unspecified shoulder, not elsewhere classified: Secondary | ICD-10-CM | POA: Diagnosis not present

## 2013-10-04 DIAGNOSIS — M7500 Adhesive capsulitis of unspecified shoulder: Secondary | ICD-10-CM | POA: Diagnosis not present

## 2013-10-04 DIAGNOSIS — M25519 Pain in unspecified shoulder: Secondary | ICD-10-CM | POA: Diagnosis not present

## 2013-10-04 DIAGNOSIS — IMO0001 Reserved for inherently not codable concepts without codable children: Secondary | ICD-10-CM | POA: Diagnosis not present

## 2013-10-09 ENCOUNTER — Ambulatory Visit: Payer: Medicare Other | Admitting: Physical Therapy

## 2013-10-09 DIAGNOSIS — R5381 Other malaise: Secondary | ICD-10-CM | POA: Diagnosis not present

## 2013-10-09 DIAGNOSIS — M7500 Adhesive capsulitis of unspecified shoulder: Secondary | ICD-10-CM | POA: Diagnosis not present

## 2013-10-09 DIAGNOSIS — I10 Essential (primary) hypertension: Secondary | ICD-10-CM | POA: Diagnosis not present

## 2013-10-09 DIAGNOSIS — M25519 Pain in unspecified shoulder: Secondary | ICD-10-CM | POA: Diagnosis not present

## 2013-10-09 DIAGNOSIS — IMO0001 Reserved for inherently not codable concepts without codable children: Secondary | ICD-10-CM | POA: Diagnosis not present

## 2013-10-09 DIAGNOSIS — M25619 Stiffness of unspecified shoulder, not elsewhere classified: Secondary | ICD-10-CM | POA: Diagnosis not present

## 2013-10-11 ENCOUNTER — Ambulatory Visit: Payer: Medicare Other | Admitting: Physical Therapy

## 2013-10-11 DIAGNOSIS — M25619 Stiffness of unspecified shoulder, not elsewhere classified: Secondary | ICD-10-CM | POA: Diagnosis not present

## 2013-10-11 DIAGNOSIS — I10 Essential (primary) hypertension: Secondary | ICD-10-CM | POA: Diagnosis not present

## 2013-10-11 DIAGNOSIS — M25519 Pain in unspecified shoulder: Secondary | ICD-10-CM | POA: Diagnosis not present

## 2013-10-11 DIAGNOSIS — R5381 Other malaise: Secondary | ICD-10-CM | POA: Diagnosis not present

## 2013-10-11 DIAGNOSIS — IMO0001 Reserved for inherently not codable concepts without codable children: Secondary | ICD-10-CM | POA: Diagnosis not present

## 2013-10-11 DIAGNOSIS — M7500 Adhesive capsulitis of unspecified shoulder: Secondary | ICD-10-CM | POA: Diagnosis not present

## 2013-10-16 DIAGNOSIS — M7500 Adhesive capsulitis of unspecified shoulder: Secondary | ICD-10-CM | POA: Diagnosis not present

## 2013-10-19 ENCOUNTER — Ambulatory Visit: Payer: Medicare Other | Attending: Orthopedic Surgery | Admitting: Physical Therapy

## 2013-10-19 DIAGNOSIS — E119 Type 2 diabetes mellitus without complications: Secondary | ICD-10-CM | POA: Insufficient documentation

## 2013-10-19 DIAGNOSIS — R5381 Other malaise: Secondary | ICD-10-CM | POA: Diagnosis not present

## 2013-10-19 DIAGNOSIS — M7500 Adhesive capsulitis of unspecified shoulder: Secondary | ICD-10-CM | POA: Diagnosis not present

## 2013-10-19 DIAGNOSIS — M25519 Pain in unspecified shoulder: Secondary | ICD-10-CM | POA: Insufficient documentation

## 2013-10-19 DIAGNOSIS — I10 Essential (primary) hypertension: Secondary | ICD-10-CM | POA: Insufficient documentation

## 2013-10-19 DIAGNOSIS — M25619 Stiffness of unspecified shoulder, not elsewhere classified: Secondary | ICD-10-CM | POA: Insufficient documentation

## 2013-10-19 DIAGNOSIS — IMO0001 Reserved for inherently not codable concepts without codable children: Secondary | ICD-10-CM | POA: Insufficient documentation

## 2013-10-23 ENCOUNTER — Ambulatory Visit: Payer: Medicare Other | Admitting: Physical Therapy

## 2013-10-23 DIAGNOSIS — IMO0001 Reserved for inherently not codable concepts without codable children: Secondary | ICD-10-CM | POA: Diagnosis not present

## 2013-10-26 ENCOUNTER — Ambulatory Visit: Payer: Medicare Other | Admitting: Physical Therapy

## 2013-10-26 DIAGNOSIS — IMO0001 Reserved for inherently not codable concepts without codable children: Secondary | ICD-10-CM | POA: Diagnosis not present

## 2013-10-30 ENCOUNTER — Ambulatory Visit: Payer: Medicare Other | Admitting: Physical Therapy

## 2013-10-30 DIAGNOSIS — IMO0001 Reserved for inherently not codable concepts without codable children: Secondary | ICD-10-CM | POA: Diagnosis not present

## 2013-11-02 ENCOUNTER — Ambulatory Visit: Payer: Medicare Other | Admitting: Physical Therapy

## 2013-11-02 DIAGNOSIS — IMO0001 Reserved for inherently not codable concepts without codable children: Secondary | ICD-10-CM | POA: Diagnosis not present

## 2013-11-07 ENCOUNTER — Ambulatory Visit: Payer: Medicare Other | Admitting: Physical Therapy

## 2013-11-07 DIAGNOSIS — IMO0001 Reserved for inherently not codable concepts without codable children: Secondary | ICD-10-CM | POA: Diagnosis not present

## 2013-11-09 ENCOUNTER — Ambulatory Visit: Payer: Medicare Other | Admitting: Physical Therapy

## 2013-11-09 DIAGNOSIS — IMO0001 Reserved for inherently not codable concepts without codable children: Secondary | ICD-10-CM | POA: Diagnosis not present

## 2013-11-15 ENCOUNTER — Telehealth: Payer: Self-pay | Admitting: Family Medicine

## 2013-11-15 MED ORDER — VALSARTAN-HYDROCHLOROTHIAZIDE 320-25 MG PO TABS: 1.0000 | ORAL_TABLET | Freq: Every day | ORAL | Status: DC

## 2013-11-15 NOTE — Telephone Encounter (Signed)
done

## 2013-11-22 ENCOUNTER — Other Ambulatory Visit: Payer: Self-pay | Admitting: Gastroenterology

## 2013-11-22 DIAGNOSIS — Z86010 Personal history of colonic polyps: Secondary | ICD-10-CM | POA: Diagnosis not present

## 2013-11-22 DIAGNOSIS — D126 Benign neoplasm of colon, unspecified: Secondary | ICD-10-CM | POA: Diagnosis not present

## 2013-11-22 DIAGNOSIS — Z09 Encounter for follow-up examination after completed treatment for conditions other than malignant neoplasm: Secondary | ICD-10-CM | POA: Diagnosis not present

## 2013-11-22 DIAGNOSIS — K5730 Diverticulosis of large intestine without perforation or abscess without bleeding: Secondary | ICD-10-CM | POA: Diagnosis not present

## 2013-11-29 ENCOUNTER — Other Ambulatory Visit (INDEPENDENT_AMBULATORY_CARE_PROVIDER_SITE_OTHER): Payer: Medicare Other

## 2013-11-29 DIAGNOSIS — I10 Essential (primary) hypertension: Secondary | ICD-10-CM | POA: Diagnosis not present

## 2013-11-29 DIAGNOSIS — E119 Type 2 diabetes mellitus without complications: Secondary | ICD-10-CM | POA: Diagnosis not present

## 2013-11-29 DIAGNOSIS — E559 Vitamin D deficiency, unspecified: Secondary | ICD-10-CM

## 2013-11-29 LAB — POCT GLYCOSYLATED HEMOGLOBIN (HGB A1C): Hemoglobin A1C: 8.1

## 2013-11-30 LAB — NMR, LIPOPROFILE
Cholesterol: 133 mg/dL (ref 100–199)
HDL Cholesterol by NMR: 39 mg/dL — ABNORMAL LOW (ref >39)
HDL PARTICLE NUMBER: 36.2 umol/L (ref >=30.5)
LDL Particle Number: 1261 nmol/L — ABNORMAL HIGH (ref <1000)
LDL SIZE: 19.9 nm (ref >20.5)
LDLC SERPL CALC-MCNC: 67 mg/dL (ref 0–99)
LP-IR SCORE: 80 — AB (ref <=45)
SMALL LDL PARTICLE NUMBER: 920 nmol/L — AB (ref <=527)
Triglycerides by NMR: 133 mg/dL (ref 0–149)

## 2013-11-30 LAB — HEPATIC FUNCTION PANEL
ALBUMIN: 4.8 g/dL (ref 3.6–4.8)
ALT: 42 IU/L (ref 0–44)
AST: 27 IU/L (ref 0–40)
Alkaline Phosphatase: 48 IU/L (ref 39–117)
BILIRUBIN DIRECT: 0.19 mg/dL (ref 0.00–0.40)
BILIRUBIN TOTAL: 0.5 mg/dL (ref 0.0–1.2)
Total Protein: 6.6 g/dL (ref 6.0–8.5)

## 2013-11-30 LAB — VITAMIN D 25 HYDROXY (VIT D DEFICIENCY, FRACTURES): VIT D 25 HYDROXY: 44.5 ng/mL (ref 30.0–100.0)

## 2013-12-15 ENCOUNTER — Other Ambulatory Visit: Payer: Self-pay | Admitting: Family Medicine

## 2013-12-18 ENCOUNTER — Ambulatory Visit (INDEPENDENT_AMBULATORY_CARE_PROVIDER_SITE_OTHER): Payer: Medicare Other | Admitting: Family Medicine

## 2013-12-18 ENCOUNTER — Other Ambulatory Visit: Payer: Self-pay | Admitting: Family Medicine

## 2013-12-18 ENCOUNTER — Encounter: Payer: Self-pay | Admitting: Family Medicine

## 2013-12-18 VITALS — BP 132/90 | HR 83 | Temp 97.2°F | Ht 70.0 in | Wt 182.0 lb

## 2013-12-18 DIAGNOSIS — E039 Hypothyroidism, unspecified: Secondary | ICD-10-CM

## 2013-12-18 DIAGNOSIS — N400 Enlarged prostate without lower urinary tract symptoms: Secondary | ICD-10-CM

## 2013-12-18 DIAGNOSIS — E119 Type 2 diabetes mellitus without complications: Secondary | ICD-10-CM | POA: Diagnosis not present

## 2013-12-18 DIAGNOSIS — I10 Essential (primary) hypertension: Secondary | ICD-10-CM | POA: Diagnosis not present

## 2013-12-18 DIAGNOSIS — M7500 Adhesive capsulitis of unspecified shoulder: Secondary | ICD-10-CM

## 2013-12-18 DIAGNOSIS — E559 Vitamin D deficiency, unspecified: Secondary | ICD-10-CM

## 2013-12-18 DIAGNOSIS — E785 Hyperlipidemia, unspecified: Secondary | ICD-10-CM

## 2013-12-18 DIAGNOSIS — M7502 Adhesive capsulitis of left shoulder: Secondary | ICD-10-CM

## 2013-12-18 MED ORDER — EZETIMIBE 10 MG PO TABS: 10.0000 mg | ORAL_TABLET | Freq: Every day | ORAL | Status: DC

## 2013-12-18 MED ORDER — EZETIMIBE 10 MG PO TABS
10.0000 mg | ORAL_TABLET | Freq: Every day | ORAL | Status: DC
Start: 2013-12-18 — End: 2014-05-31

## 2013-12-18 MED ORDER — FENOFIBRATE 160 MG PO TABS: 160.0000 mg | ORAL_TABLET | Freq: Every day | ORAL | Status: DC

## 2013-12-18 MED ORDER — METFORMIN HCL ER 500 MG PO TB24: 500.0000 mg | ORAL_TABLET | Freq: Three times a day (TID) | ORAL | Status: DC

## 2013-12-18 NOTE — Patient Instructions (Addendum)
Medicare Annual Wellness Visit  [facility] and the medical providers at [facility] strive to bring you the best medical care.  In doing so we not only want to address your current medical conditions and concerns but also to detect new conditions early and prevent illness, disease and health-related problems.    Medicare offers a yearly Wellness Visit which allows our clinical staff to assess your need for preventative services including immunizations, lifestyle education, counseling to decrease risk of preventable diseases and screening for fall risk and other medical concerns.    This visit is provided free of charge (no copay) for all Medicare recipients. The clinical pharmacists at [facility] have begun to conduct these Wellness Visits which will also include a thorough review of all your medications.    As you primary medical provider recommend that you make an appointment for your Annual Wellness Visit if you have not done so already this year.  You may set up this appointment before you leave today or you may call back ([phone]) and schedule an appointment.  Please make sure when you call that you mention that you are scheduling your Annual Wellness Visit with the clinical pharmacist so that the appointment may be made for the proper length of time.     Continue current medications. Continue good therapeutic lifestyle changes which include good diet and exercise. Fall precautions discussed with patient. If an FOBT was given today- please return it to our front desk. If you are over 16 years old - you may need Prevnar 13 or the adult Pneumonia vaccine.  Flu Shots will be available at our office starting mid- September. Please call and schedule a FLU CLINIC APPOINTMENT.   Continue home physical therapy Continue to watch your diet closely and get as much exercise as possible Monitor your blood pressures and blood sugars  and bring those readings back to your next visit

## 2013-12-18 NOTE — Progress Notes (Signed)
Subjective:    Patient ID: Matthew Daugherty, male    DOB: February 24, 1946, 68 y.o.   MRN: [id number]  HPI Pt here for follow up and management of chronic medical problems. It is important to note that the patient has been seeing the orthopedist for a frozen shoulder on the left side. He has been getting physical therapy and shoulder is doing much better. He did receive several shots of cortisone. He also had a colonoscopy by Dr. Winifred Olive and he was found to have a polyp and will have to have a repeat colonoscopy in 5 years. He has increased his blood sugar medication and he indicates that his fasting blood sugars have come down somewhat. His recent lab work was reviewed with him today. He indicates that his home blood pressures consistently run in the 128 to 1:30 range over the 68-70 range. He said that his sugars have been running anywhere from 104-1 TM and are now running in the 90s. He has an eye exam plan in October.         Patient Active Problem List   Diagnosis Date Noted  . Hypothyroid 12/05/2012  . Vitamin D deficiency 12/05/2012  . Type 2 diabetes mellitus 12/05/2012  . Hyperlipemia 08/02/2012  . Hypertension   . Cardiac dysrhythmia, unspecified, history of   . BPH (benign prostatic hyperplasia)    Outpatient Encounter Prescriptions as of 12/18/2013  Medication Sig  . aspirin 81 MG EC tablet Take 81 mg by mouth daily.    Marland Kitchen atorvastatin (LIPITOR) 80 MG tablet Take 1 tablet (80 mg total) by mouth daily at 6 PM.  . ezetimibe (ZETIA) 10 MG tablet Take 1 tablet (10 mg total) by mouth daily.  . fenofibrate 160 MG tablet Take 1 tablet (160 mg total) by mouth daily.  Marland Kitchen levothyroxine (SYNTHROID, LEVOTHROID) 75 MCG tablet Take 1 tablet (75 mcg total) by mouth daily before breakfast.  . metFORMIN (GLUCOPHAGE-XR) 500 MG 24 hr tablet Take 1 tablet (500 mg total) by mouth 2 (two) times daily.  . Multiple Vitamin (MULTIVITAMIN WITH MINERALS) TABS tablet Take 1 tablet by mouth daily.  .  valsartan-hydrochlorothiazide (DIOVAN-HCT) 320-25 MG per tablet Take 1 tablet by mouth daily.  . Cholecalciferol (VITAMIN D-3) 5000 UNITS TABS Take 1 tablet by mouth daily.      Review of Systems  Constitutional: Negative.   HENT: Negative.   Eyes: Negative.   Respiratory: Negative.   Cardiovascular: Negative.   Gastrointestinal: Negative.   Endocrine: Negative.   Genitourinary: Negative.   Musculoskeletal: Negative.   Skin: Negative.   Allergic/Immunologic: Negative.   Neurological: Negative.   Hematological: Negative.   Psychiatric/Behavioral: Negative.        Objective:   Physical Exam  Nursing note and vitals reviewed. Constitutional: He is oriented to person, place, and time. He appears well-developed and well-nourished. No distress.  HENT:  Head: Normocephalic and atraumatic.  Right Ear: External ear normal.  Left Ear: External ear normal.  Nose: Nose normal.  Mouth/Throat: Oropharynx is clear and moist. No oropharyngeal exudate.  Eyes: Conjunctivae and EOM are normal. Pupils are equal, round, and reactive to light. Right eye exhibits no discharge. Left eye exhibits no discharge. No scleral icterus.  Neck: Normal range of motion. Neck supple. No thyromegaly present.  No carotid bruits  Cardiovascular: Normal rate, regular rhythm, normal heart sounds and intact distal pulses.  Exam reveals no gallop and no friction rub.   No murmur heard. At 72 per minute  Pulmonary/Chest: Effort  normal and breath sounds normal. No respiratory distress. He has no wheezes. He has no rales. He exhibits no tenderness.   Axillary is negative for adenopathy  Abdominal: Soft. Bowel sounds are normal. He exhibits no mass. There is no tenderness. There is no rebound and no guarding.  Musculoskeletal: Normal range of motion. He exhibits no edema and no tenderness.  Lymphadenopathy:    He has no cervical adenopathy.  Neurological: He is alert and oriented to person, place, and time. He has  normal reflexes. No cranial nerve deficit.  Skin: Skin is warm and dry. No rash noted. No erythema. No pallor.  Psychiatric: He has a normal mood and affect. His behavior is normal. Judgment and thought content normal.   BP 153/83  Pulse 83  Temp(Src) 97.2 F (36.2 C) (Oral)  Ht '5\' 10"'  (1.778 m)  Wt 182 lb (82.555 kg)  BMI 26.11 kg/m2        Assessment & Plan:  1. Hyperlipemia  2. Hypertension - BMP8+EGFR  3. Hypothyroidism, unspecified hypothyroidism type  4. Type 2 diabetes mellitus without complication  5. BPH (benign prostatic hyperplasia)  6. Vitamin D deficiency  7. Frozen shoulder syndrome, left  Meds ordered this encounter  Medications  . metFORMIN (GLUCOPHAGE-XR) 500 MG 24 hr tablet    Sig: Take 1 tablet (500 mg total) by mouth 3 (three) times daily.    Dispense:  270 tablet    Refill:  3  . fenofibrate 160 MG tablet    Sig: Take 1 tablet (160 mg total) by mouth daily.    Dispense:  90 tablet    Refill:  3  . DISCONTD: ezetimibe (ZETIA) 10 MG tablet    Sig: Take 1 tablet (10 mg total) by mouth daily.    Dispense:  90 tablet    Refill:  3  . ezetimibe (ZETIA) 10 MG tablet    Sig: Take 1 tablet (10 mg total) by mouth daily.    Dispense:  90 tablet    Refill:  3   Patient Instructions                       Medicare Annual Wellness Visit  [place] and the medical providers at [facility] strive to bring you the best medical care.  In doing so we not only want to address your current medical conditions and concerns but also to detect new conditions early and prevent illness, disease and health-related problems.    Medicare offers a yearly Wellness Visit which allows our clinical staff to assess your need for preventative services including immunizations, lifestyle education, counseling to decrease risk of preventable diseases and screening for fall risk and other medical concerns.    This visit is provided free of charge (no  copay) for all Medicare recipients. The clinical pharmacists at [facility] have begun to conduct these Wellness Visits which will also include a thorough review of all your medications.    As you primary medical provider recommend that you make an appointment for your Annual Wellness Visit if you have not done so already this year.  You may set up this appointment before you leave today or you may call back ([phone]) and schedule an appointment.  Please make sure when you call that you mention that you are scheduling your Annual Wellness Visit with the clinical pharmacist so that the appointment may be made for the proper length of time.  Continue current medications. Continue good therapeutic lifestyle changes which include good diet and exercise. Fall precautions discussed with patient. If an FOBT was given today- please return it to our front desk. If you are over 12 years old - you may need Prevnar 7 or the adult Pneumonia vaccine.  Flu Shots will be available at our office starting mid- September. Please call and schedule a FLU CLINIC APPOINTMENT.   Continue home physical therapy Continue to watch your diet closely and get as much exercise as possible Monitor your blood pressures and blood sugars and bring those readings back to your next visit   Arrie Senate MD

## 2013-12-19 ENCOUNTER — Telehealth: Payer: Self-pay | Admitting: Family Medicine

## 2013-12-19 LAB — BMP8+EGFR
BUN/Creatinine Ratio: 12 (ref 10–22)
BUN: 15 mg/dL (ref 8–27)
CHLORIDE: 96 mmol/L — AB (ref 97–108)
CO2: 24 mmol/L (ref 18–29)
Calcium: 9.8 mg/dL (ref 8.6–10.2)
Creatinine, Ser: 1.26 mg/dL (ref 0.76–1.27)
GFR calc Af Amer: 69 mL/min/{1.73_m2} (ref >59)
GFR calc non Af Amer: 59 mL/min/{1.73_m2} — ABNORMAL LOW (ref >59)
Glucose: 182 mg/dL — ABNORMAL HIGH (ref 65–99)
POTASSIUM: 5 mmol/L (ref 3.5–5.2)
SODIUM: 137 mmol/L (ref 134–144)

## 2013-12-19 NOTE — Telephone Encounter (Signed)
Patient aware.

## 2013-12-19 NOTE — Telephone Encounter (Signed)
Message copied by Cline Crock on Tue Dec 19, 2013 10:06 AM ------      Message from: Chipper Herb      Created: Tue Dec 19, 2013  7:06 AM       The BMP has a blood sugar that is 182. The creatinine, the most important kidney function tests is within normal limits. The electrolytes are within normal limits except the chloride is slightly decreased, but this is okay. The patient was not fasting when this was done. ------

## 2013-12-19 NOTE — Telephone Encounter (Signed)
Called in.

## 2014-02-01 ENCOUNTER — Other Ambulatory Visit: Payer: Self-pay | Admitting: Family Medicine

## 2014-02-02 DIAGNOSIS — H40033 Anatomical narrow angle, bilateral: Secondary | ICD-10-CM | POA: Diagnosis not present

## 2014-02-02 DIAGNOSIS — H2513 Age-related nuclear cataract, bilateral: Secondary | ICD-10-CM | POA: Diagnosis not present

## 2014-03-29 ENCOUNTER — Other Ambulatory Visit (INDEPENDENT_AMBULATORY_CARE_PROVIDER_SITE_OTHER): Payer: Medicare Other

## 2014-03-29 DIAGNOSIS — E119 Type 2 diabetes mellitus without complications: Secondary | ICD-10-CM | POA: Diagnosis not present

## 2014-03-29 DIAGNOSIS — I10 Essential (primary) hypertension: Secondary | ICD-10-CM | POA: Diagnosis not present

## 2014-03-29 DIAGNOSIS — E559 Vitamin D deficiency, unspecified: Secondary | ICD-10-CM | POA: Diagnosis not present

## 2014-03-29 DIAGNOSIS — E785 Hyperlipidemia, unspecified: Secondary | ICD-10-CM | POA: Diagnosis not present

## 2014-03-29 DIAGNOSIS — E039 Hypothyroidism, unspecified: Secondary | ICD-10-CM | POA: Diagnosis not present

## 2014-03-29 LAB — POCT CBC
Granulocyte percent: 66.3 %G (ref 37–80)
HEMATOCRIT: 41.5 % — AB (ref 43.5–53.7)
Hemoglobin: 14.1 g/dL (ref 14.1–18.1)
Lymph, poc: 2.2 (ref 0.6–3.4)
MCH, POC: 30.1 pg (ref 27–31.2)
MCHC: 33.9 g/dL (ref 31.8–35.4)
MCV: 88.7 fL (ref 80–97)
MPV: 8.4 fL (ref 0–99.8)
POC Granulocyte: 5.1 (ref 2–6.9)
POC LYMPH PERCENT: 28.9 %L (ref 10–50)
Platelet Count, POC: 312 10*3/uL (ref 142–424)
RBC: 4.7 M/uL (ref 4.69–6.13)
RDW, POC: 12.2 %
WBC: 7.7 10*3/uL (ref 4.6–10.2)

## 2014-03-29 LAB — POCT GLYCOSYLATED HEMOGLOBIN (HGB A1C): Hemoglobin A1C: 7.6

## 2014-03-29 NOTE — Progress Notes (Signed)
LAB ONLY 

## 2014-03-30 LAB — NMR, LIPOPROFILE
Cholesterol: 119 mg/dL (ref 100–199)
HDL Cholesterol by NMR: 35 mg/dL — ABNORMAL LOW (ref >39)
HDL PARTICLE NUMBER: 35.4 umol/L (ref >=30.5)
LDL Particle Number: 1032 nmol/L — ABNORMAL HIGH (ref <1000)
LDL Size: 20.2 nm (ref >20.5)
LDL-C: 57 mg/dL (ref 0–99)
LP-IR Score: 75 — ABNORMAL HIGH (ref <=45)
SMALL LDL PARTICLE NUMBER: 641 nmol/L — AB (ref <=527)
Triglycerides by NMR: 136 mg/dL (ref 0–149)

## 2014-03-30 LAB — BMP8+EGFR
BUN / CREAT RATIO: 13 (ref 10–22)
BUN: 16 mg/dL (ref 8–27)
CO2: 24 mmol/L (ref 18–29)
Calcium: 10 mg/dL (ref 8.6–10.2)
Chloride: 97 mmol/L (ref 97–108)
Creatinine, Ser: 1.23 mg/dL (ref 0.76–1.27)
GFR calc non Af Amer: 61 mL/min/{1.73_m2} (ref >59)
GFR, EST AFRICAN AMERICAN: 71 mL/min/{1.73_m2} (ref >59)
Glucose: 187 mg/dL — ABNORMAL HIGH (ref 65–99)
Potassium: 5 mmol/L (ref 3.5–5.2)
Sodium: 140 mmol/L (ref 134–144)

## 2014-03-30 LAB — HEPATIC FUNCTION PANEL
ALBUMIN: 4.8 g/dL (ref 3.6–4.8)
ALK PHOS: 56 IU/L (ref 39–117)
ALT: 34 IU/L (ref 0–44)
AST: 28 IU/L (ref 0–40)
Bilirubin, Direct: 0.25 mg/dL (ref 0.00–0.40)
Total Bilirubin: 0.7 mg/dL (ref 0.0–1.2)
Total Protein: 7.1 g/dL (ref 6.0–8.5)

## 2014-03-30 LAB — VITAMIN D 25 HYDROXY (VIT D DEFICIENCY, FRACTURES): Vit D, 25-Hydroxy: 44.8 ng/mL (ref 30.0–100.0)

## 2014-04-02 ENCOUNTER — Telehealth: Payer: Self-pay | Admitting: *Deleted

## 2014-04-02 NOTE — Telephone Encounter (Signed)
Patient aware of lab results.

## 2014-04-19 ENCOUNTER — Ambulatory Visit (INDEPENDENT_AMBULATORY_CARE_PROVIDER_SITE_OTHER): Payer: Medicare Other | Admitting: Family Medicine

## 2014-04-19 ENCOUNTER — Encounter: Payer: Self-pay | Admitting: Family Medicine

## 2014-04-19 VITALS — BP 146/90 | HR 84 | Temp 97.4°F | Ht 70.0 in | Wt 182.0 lb

## 2014-04-19 DIAGNOSIS — E785 Hyperlipidemia, unspecified: Secondary | ICD-10-CM | POA: Diagnosis not present

## 2014-04-19 DIAGNOSIS — E119 Type 2 diabetes mellitus without complications: Secondary | ICD-10-CM

## 2014-04-19 DIAGNOSIS — Z23 Encounter for immunization: Secondary | ICD-10-CM | POA: Diagnosis not present

## 2014-04-19 DIAGNOSIS — I10 Essential (primary) hypertension: Secondary | ICD-10-CM | POA: Diagnosis not present

## 2014-04-19 DIAGNOSIS — J069 Acute upper respiratory infection, unspecified: Secondary | ICD-10-CM | POA: Diagnosis not present

## 2014-04-19 DIAGNOSIS — E039 Hypothyroidism, unspecified: Secondary | ICD-10-CM | POA: Diagnosis not present

## 2014-04-19 DIAGNOSIS — E559 Vitamin D deficiency, unspecified: Secondary | ICD-10-CM | POA: Diagnosis not present

## 2014-04-19 DIAGNOSIS — N400 Enlarged prostate without lower urinary tract symptoms: Secondary | ICD-10-CM | POA: Diagnosis not present

## 2014-04-19 MED ORDER — METFORMIN HCL ER 500 MG PO TB24: ORAL_TABLET | ORAL | Status: DC

## 2014-04-19 NOTE — Progress Notes (Addendum)
Subjective:    Patient ID: Matthew Daugherty, male    DOB: Sep 01, 1945, 68 y.o.   MRN: [id number]  HPI Pt here for follow up and management of chronic medical problems. The patient's recent lab work will be reviewed with him this morning. Everything was good, his cholesterol numbers were improved but his A1c was 7.6%. This number needs to be lower. The patient had his eye exam in October. He is due to get the Pneumovax vaccine today. The patient also complains of some congestion. This is mostly chest congestion. He has not had a fever is mostly just a cough. The sputum is clear in color. He is due today also to get his flu shot. Home blood sugars and blood pressures were brought in for review and they will be scanned into the record. Also his most recent lab work was reviewed with him during the visit.         Patient Active Problem List   Diagnosis Date Noted  . Hypothyroid 12/05/2012  . Vitamin D deficiency 12/05/2012  . Type 2 diabetes mellitus 12/05/2012  . Hyperlipemia 08/02/2012  . Hypertension   . Cardiac dysrhythmia, unspecified, history of   . BPH (benign prostatic hyperplasia)    Outpatient Encounter Prescriptions as of 04/19/2014  Medication Sig  . aspirin 81 MG EC tablet Take 81 mg by mouth daily.    Marland Kitchen atorvastatin (LIPITOR) 80 MG tablet Take 1 tablet (80 mg total) by mouth daily at 6 PM.  . Cholecalciferol (VITAMIN D-3) 5000 UNITS TABS Take 1 tablet by mouth daily.    Marland Kitchen ezetimibe (ZETIA) 10 MG tablet Take 1 tablet (10 mg total) by mouth daily.  . fenofibrate 160 MG tablet Take 1 tablet (160 mg total) by mouth daily.  Marland Kitchen levothyroxine (SYNTHROID, LEVOTHROID) 75 MCG tablet Take 1 tablet (75 mcg total) by mouth daily before breakfast.  . metFORMIN (GLUCOPHAGE-XR) 500 MG 24 hr tablet Take 1 tablet (500 mg total) by mouth 3 (three) times daily.  . Multiple Vitamin (MULTIVITAMIN WITH MINERALS) TABS tablet Take 1 tablet by mouth daily.  . valsartan-hydrochlorothiazide (DIOVAN-HCT)  320-25 MG per tablet Take 1 tablet by mouth daily.    Review of Systems  Constitutional: Negative.   HENT: Positive for congestion.   Eyes: Negative.   Respiratory: Negative.   Cardiovascular: Negative.   Gastrointestinal: Negative.   Endocrine: Negative.   Genitourinary: Negative.   Musculoskeletal: Negative.   Skin: Negative.   Allergic/Immunologic: Negative.   Neurological: Negative.   Hematological: Negative.   Psychiatric/Behavioral: Negative.        Objective:   Physical Exam  Constitutional: He is oriented to person, place, and time. He appears well-developed and well-nourished. No distress.  HENT:  Head: Normocephalic and atraumatic.  Right Ear: External ear normal.  Left Ear: External ear normal.  Nose: Nose normal.  Mouth/Throat: Oropharynx is clear and moist. No oropharyngeal exudate.  Minimal nasal congestion bilaterally  Eyes: Conjunctivae and EOM are normal. Pupils are equal, round, and reactive to light. Right eye exhibits no discharge. Left eye exhibits no discharge. No scleral icterus.  Neck: Normal range of motion. Neck supple. No thyromegaly present.  No anterior cervical nodes and no bruits  Cardiovascular: Normal rate, regular rhythm, normal heart sounds and intact distal pulses.  Exam reveals no gallop and no friction rub.   No murmur heard. At 84/m  Pulmonary/Chest: Effort normal and breath sounds normal. No respiratory distress. He has no wheezes. He has no rales. He exhibits  no tenderness.  Minimal congestion with coughing with good breath sounds bilaterally. No axillary adenopathy  Abdominal: Soft. Bowel sounds are normal. He exhibits no mass. There is no tenderness. There is no rebound and no guarding.  No inguinal adenopathy or abdominal bruits  Musculoskeletal: Normal range of motion. He exhibits no edema or tenderness.  Lymphadenopathy:    He has no cervical adenopathy.  Neurological: He is alert and oriented to person, place, and time. He has  normal reflexes. No cranial nerve deficit.  Skin: Skin is warm and dry. No rash noted. No erythema. No pallor.  Psychiatric: He has a normal mood and affect. His behavior is normal. Judgment and thought content normal.  Nursing note and vitals reviewed.  BP 143/86 mmHg  Pulse 102  Temp(Src) 97.4 F (36.3 C) (Oral)  Ht 5\' 10"  (1.778 m)  Wt 182 lb (82.555 kg)  BMI 26.11 kg/m2  Repeat blood pressure 146/90 and repeat pulse was 84      Assessment & Plan:  1. Hyperlipemia  2. Hypertension  3. Hypothyroidism, unspecified hypothyroidism type  4. Type 2 diabetes mellitus without complication  5. BPH (benign prostatic hyperplasia)  6. Vitamin D deficiency  7. URI (upper respiratory infection)  Meds ordered this encounter  Medications  . metFORMIN (GLUCOPHAGE-XR) 500 MG 24 hr tablet    Sig: Take 2 tabs BID as directed.    Dispense:  360 tablet    Refill:  3   Patient Instructions                       Medicare Annual Wellness Visit  [place] and the medical providers at [facility] strive to bring you the best medical care.  In doing so we not only want to address your current medical conditions and concerns but also to detect new conditions early and prevent illness, disease and health-related problems.    Medicare offers a yearly Wellness Visit which allows our clinical staff to assess your need for preventative services including immunizations, lifestyle education, counseling to decrease risk of preventable diseases and screening for fall risk and other medical concerns.    This visit is provided free of charge (no copay) for all Medicare recipients. The clinical pharmacists at [facility] have begun to conduct these Wellness Visits which will also include a thorough review of all your medications.    As you primary medical provider recommend that you make an appointment for your Annual Wellness Visit if you have not done so  already this year.  You may set up this appointment before you leave today or you may call back ([phone]) and schedule an appointment.  Please make sure when you call that you mention that you are scheduling your Annual Wellness Visit with the clinical pharmacist so that the appointment may be made for the proper length of time.     Continue current medications. Continue good therapeutic lifestyle changes which include good diet and exercise. Fall precautions discussed with patient. If an FOBT was given today- please return it to our front desk. If you are over 1 years old - you may need Prevnar 57 or the adult Pneumonia vaccine.  Flu Shots will be available at our office starting mid- September. Please call and schedule a FLU CLINIC APPOINTMENT.   Try to get more exercise Drink more water Watch sodium intake Take Mucinex maximum strength over-the-counter, blue and white in color plain Mucinex, 1 twice daily with  a large glass of water for cough and congestion Use saline nose spray as needed for head congestion If no better in 7-10 days, call back and we will call in a Z-Pak for you to see if this can expedite her recovery Increase the metformin to 2 twice daily Continue to monitor blood pressures and blood sugars and monitor your feet   Arrie Senate MD

## 2014-04-19 NOTE — Addendum Note (Signed)
Addended by: Zannie Cove on: 04/19/2014 08:49 AM   Modules accepted: Orders

## 2014-04-19 NOTE — Patient Instructions (Addendum)
Medicare Annual Wellness Visit  [facility] and the medical providers at [facility] strive to bring you the best medical care.  In doing so we not only want to address your current medical conditions and concerns but also to detect new conditions early and prevent illness, disease and health-related problems.    Medicare offers a yearly Wellness Visit which allows our clinical staff to assess your need for preventative services including immunizations, lifestyle education, counseling to decrease risk of preventable diseases and screening for fall risk and other medical concerns.    This visit is provided free of charge (no copay) for all Medicare recipients. The clinical pharmacists at [facility] have begun to conduct these Wellness Visits which will also include a thorough review of all your medications.    As you primary medical provider recommend that you make an appointment for your Annual Wellness Visit if you have not done so already this year.  You may set up this appointment before you leave today or you may call back ([phone]) and schedule an appointment.  Please make sure when you call that you mention that you are scheduling your Annual Wellness Visit with the clinical pharmacist so that the appointment may be made for the proper length of time.     Continue current medications. Continue good therapeutic lifestyle changes which include good diet and exercise. Fall precautions discussed with patient. If an FOBT was given today- please return it to our front desk. If you are over 19 years old - you may need Prevnar 73 or the adult Pneumonia vaccine.  Flu Shots will be available at our office starting mid- September. Please call and schedule a FLU CLINIC APPOINTMENT.   Try to get more exercise Drink more water Watch sodium intake Take Mucinex maximum strength over-the-counter, blue and white in color plain Mucinex, 1  twice daily with a large glass of water for cough and congestion Use saline nose spray as needed for head congestion If no better in 7-10 days, call back and we will call in a Z-Pak for you to see if this can expedite her recovery Increase the metformin to 2 twice daily Continue to monitor blood pressures and blood sugars and monitor your feet

## 2014-05-25 ENCOUNTER — Telehealth: Payer: Self-pay | Admitting: Family Medicine

## 2014-05-31 ENCOUNTER — Other Ambulatory Visit: Payer: Self-pay | Admitting: *Deleted

## 2014-05-31 MED ORDER — METFORMIN HCL ER 500 MG PO TB24: 500.0000 mg | ORAL_TABLET | Freq: Three times a day (TID) | ORAL | Status: DC

## 2014-05-31 MED ORDER — ATORVASTATIN CALCIUM 80 MG PO TABS: ORAL_TABLET | ORAL | Status: DC

## 2014-05-31 MED ORDER — VALSARTAN-HYDROCHLOROTHIAZIDE 320-25 MG PO TABS: 1.0000 | ORAL_TABLET | Freq: Every day | ORAL | Status: DC

## 2014-05-31 MED ORDER — FENOFIBRATE 160 MG PO TABS: 160.0000 mg | ORAL_TABLET | Freq: Every day | ORAL | Status: DC

## 2014-05-31 MED ORDER — EZETIMIBE 10 MG PO TABS: 10.0000 mg | ORAL_TABLET | Freq: Every day | ORAL | Status: DC

## 2014-06-06 ENCOUNTER — Other Ambulatory Visit: Payer: Self-pay | Admitting: Family Medicine

## 2014-08-10 ENCOUNTER — Other Ambulatory Visit (INDEPENDENT_AMBULATORY_CARE_PROVIDER_SITE_OTHER): Payer: Medicare Other

## 2014-08-10 DIAGNOSIS — E785 Hyperlipidemia, unspecified: Secondary | ICD-10-CM

## 2014-08-10 DIAGNOSIS — E559 Vitamin D deficiency, unspecified: Secondary | ICD-10-CM

## 2014-08-10 DIAGNOSIS — E119 Type 2 diabetes mellitus without complications: Secondary | ICD-10-CM

## 2014-08-10 DIAGNOSIS — I10 Essential (primary) hypertension: Secondary | ICD-10-CM | POA: Diagnosis not present

## 2014-08-10 LAB — POCT CBC
GRANULOCYTE PERCENT: 61.1 % (ref 37–80)
HEMATOCRIT: 39.8 % — AB (ref 43.5–53.7)
Hemoglobin: 12.9 g/dL — AB (ref 14.1–18.1)
Lymph, poc: 2.5 (ref 0.6–3.4)
MCH: 28.5 pg (ref 27–31.2)
MCHC: 32.4 g/dL (ref 31.8–35.4)
MCV: 88.2 fL (ref 80–97)
MPV: 7.6 fL (ref 0–99.8)
POC Granulocyte: 4.5 (ref 2–6.9)
POC LYMPH PERCENT: 33.8 %L (ref 10–50)
Platelet Count, POC: 344 10*3/uL (ref 142–424)
RBC: 4.51 M/uL — AB (ref 4.69–6.13)
RDW, POC: 13.1 %
WBC: 7.3 10*3/uL (ref 4.6–10.2)

## 2014-08-10 LAB — POCT GLYCOSYLATED HEMOGLOBIN (HGB A1C): HEMOGLOBIN A1C: 7.9

## 2014-08-11 LAB — HEPATIC FUNCTION PANEL
ALK PHOS: 58 IU/L (ref 39–117)
ALT: 42 IU/L (ref 0–44)
AST: 29 IU/L (ref 0–40)
Albumin: 4.7 g/dL (ref 3.6–4.8)
Bilirubin Total: 0.4 mg/dL (ref 0.0–1.2)
Bilirubin, Direct: 0.2 mg/dL (ref 0.00–0.40)
Total Protein: 6.8 g/dL (ref 6.0–8.5)

## 2014-08-11 LAB — NMR, LIPOPROFILE
CHOLESTEROL: 108 mg/dL (ref 100–199)
HDL Cholesterol by NMR: 38 mg/dL — ABNORMAL LOW (ref >39)
HDL Particle Number: 34.7 umol/L (ref >=30.5)
LDL Particle Number: 967 nmol/L (ref <1000)
LDL SIZE: 20.2 nm (ref >20.5)
LDL-C: 43 mg/dL (ref 0–99)
LP-IR SCORE: 77 — AB (ref <=45)
Small LDL Particle Number: 601 nmol/L — ABNORMAL HIGH (ref <=527)
Triglycerides by NMR: 134 mg/dL (ref 0–149)

## 2014-08-11 LAB — BMP8+EGFR
BUN/Creatinine Ratio: 13 (ref 10–22)
BUN: 15 mg/dL (ref 8–27)
CALCIUM: 10.1 mg/dL (ref 8.6–10.2)
CO2: 23 mmol/L (ref 18–29)
CREATININE: 1.14 mg/dL (ref 0.76–1.27)
Chloride: 98 mmol/L (ref 97–108)
GFR calc Af Amer: 77 mL/min/{1.73_m2} (ref >59)
GFR, EST NON AFRICAN AMERICAN: 67 mL/min/{1.73_m2} (ref >59)
GLUCOSE: 195 mg/dL — AB (ref 65–99)
POTASSIUM: 5 mmol/L (ref 3.5–5.2)
Sodium: 138 mmol/L (ref 134–144)

## 2014-08-11 LAB — VITAMIN D 25 HYDROXY (VIT D DEFICIENCY, FRACTURES): Vit D, 25-Hydroxy: 44.6 ng/mL (ref 30.0–100.0)

## 2014-08-14 ENCOUNTER — Telehealth: Payer: Self-pay | Admitting: Family Medicine

## 2014-08-14 NOTE — Progress Notes (Signed)
lmtcb

## 2014-08-16 NOTE — Telephone Encounter (Signed)
Pt aware of his lab results. He has increased his walking since the weather has gotten better and continues taking all medications. He has a recheck appt with you on 5/10. Last colonoscopy was Aug 2015.

## 2014-08-28 ENCOUNTER — Encounter: Payer: Self-pay | Admitting: Family Medicine

## 2014-08-28 ENCOUNTER — Ambulatory Visit (INDEPENDENT_AMBULATORY_CARE_PROVIDER_SITE_OTHER): Payer: Medicare Other | Admitting: Family Medicine

## 2014-08-28 VITALS — BP 116/71 | HR 94 | Temp 97.0°F | Ht 70.0 in | Wt 179.0 lb

## 2014-08-28 DIAGNOSIS — E039 Hypothyroidism, unspecified: Secondary | ICD-10-CM | POA: Diagnosis not present

## 2014-08-28 DIAGNOSIS — I10 Essential (primary) hypertension: Secondary | ICD-10-CM | POA: Diagnosis not present

## 2014-08-28 DIAGNOSIS — R7309 Other abnormal glucose: Secondary | ICD-10-CM

## 2014-08-28 DIAGNOSIS — E119 Type 2 diabetes mellitus without complications: Secondary | ICD-10-CM | POA: Diagnosis not present

## 2014-08-28 DIAGNOSIS — N400 Enlarged prostate without lower urinary tract symptoms: Secondary | ICD-10-CM

## 2014-08-28 DIAGNOSIS — E559 Vitamin D deficiency, unspecified: Secondary | ICD-10-CM

## 2014-08-28 DIAGNOSIS — D649 Anemia, unspecified: Secondary | ICD-10-CM

## 2014-08-28 DIAGNOSIS — R739 Hyperglycemia, unspecified: Secondary | ICD-10-CM

## 2014-08-28 DIAGNOSIS — E785 Hyperlipidemia, unspecified: Secondary | ICD-10-CM

## 2014-08-28 DIAGNOSIS — R710 Precipitous drop in hematocrit: Secondary | ICD-10-CM

## 2014-08-28 LAB — POCT UA - MICROSCOPIC ONLY
Bacteria, U Microscopic: NEGATIVE
Casts, Ur, LPF, POC: NEGATIVE
Crystals, Ur, HPF, POC: NEGATIVE
Epithelial cells, urine per micros: NEGATIVE
RBC, urine, microscopic: NEGATIVE
Yeast, UA: NEGATIVE

## 2014-08-28 LAB — POCT URINALYSIS DIPSTICK
GLUCOSE UA: NEGATIVE
Ketones, UA: NEGATIVE
NITRITE UA: NEGATIVE
PROTEIN UA: NEGATIVE
Spec Grav, UA: 1.02
UROBILINOGEN UA: NEGATIVE
pH, UA: 6.5

## 2014-08-28 LAB — POCT UA - MICROALBUMIN: MICROALBUMIN (UR) POC: 20 mg/L

## 2014-08-28 LAB — POCT HEMOGLOBIN: HEMOGLOBIN: 13.3 g/dL — AB (ref 14.1–18.1)

## 2014-08-28 MED ORDER — FENOFIBRATE 160 MG PO TABS: 160.0000 mg | ORAL_TABLET | Freq: Every day | ORAL | Status: DC

## 2014-08-28 MED ORDER — ATORVASTATIN CALCIUM 80 MG PO TABS: ORAL_TABLET | ORAL | Status: DC

## 2014-08-28 MED ORDER — METFORMIN HCL ER (MOD) 1000 MG PO TB24: 1000.0000 mg | ORAL_TABLET | Freq: Two times a day (BID) | ORAL | Status: DC

## 2014-08-28 NOTE — Patient Instructions (Addendum)
Medicare Annual Wellness Visit  [facility] and the medical providers at [facility] strive to bring you the best medical care.  In doing so we not only want to address your current medical conditions and concerns but also to detect new conditions early and prevent illness, disease and health-related problems.    Medicare offers a yearly Wellness Visit which allows our clinical staff to assess your need for preventative services including immunizations, lifestyle education, counseling to decrease risk of preventable diseases and screening for fall risk and other medical concerns.    This visit is provided free of charge (no copay) for all Medicare recipients. The clinical pharmacists at [facility] have begun to conduct these Wellness Visits which will also include a thorough review of all your medications.    As you primary medical provider recommend that you make an appointment for your Annual Wellness Visit if you have not done so already this year.  You may set up this appointment before you leave today or you may call back [phone]) and schedule an appointment.  Please make sure when you call that you mention that you are scheduling your Annual Wellness Visit with the clinical pharmacist so that the appointment may be made for the proper length of time.     Continue current medications. Continue good therapeutic lifestyle changes which include good diet and exercise. Fall precautions discussed with patient. If an FOBT was given today- please return it to our front desk. If you are over 53 years old - you may need Prevnar 66 or the adult Pneumonia vaccine.  Flu Shots are still available at our office. If you still haven't had one please call to set up a nurse visit to get one.   After your visit with [place] today you will receive a survey in the mail or online from [employer] regarding your care with [place]. Please take a moment to  fill this out. Your feedback is very important to [place] as you can help [place] better understand your patient needs as well as improve your experience and satisfaction. WE CARE ABOUT YOU!!!   Please try to make a special effort to watch her diet more closely and become more active and to help bring the blood sugar down more. We're increasing the metformin to the thousand milligrams XR twice daily This summer drink plenty of fluids and stay well hydrated We will call you with the additional lab work results once they become available Always check your blood sugars regularly and bring readings to the next visit as well as a few blood pressure readings. The patient has not had an exercise treadmill test in a good while and we will arrange one this fall in the office because of his history of diabetes even though he is having no symptoms.

## 2014-08-28 NOTE — Progress Notes (Signed)
Subjective:    Patient ID: Matthew Daugherty, male    DOB: [birth year], 69 y.o.   MRN: [id number]  HPI Pt here for follow up and management of chronic medical problems which includes hypothyroid, hypertension, hyperlipidemia, and diabetes. He is taking medications regularly. The patient denies chest pain shortness of breath or GI tract symptoms or voiding symptoms. He has no trouble with erections. He has been having some back pain recently and has been taking a lot of ibuprofen and this may account for his lower hemoglobin. He has not seen any blood in the stool. This to getting less exercise and promises to do better with this. All of his lab work was reviewed and the biggest problem was his A1c was up to 7.9%. We have discussed with him after reviewing the lab work that we will increase his metformin to the thousand milligrams twice a day.      Patient Active Problem List   Diagnosis Date Noted  . Hypothyroid 12/05/2012  . Vitamin D deficiency 12/05/2012  . Type 2 diabetes mellitus 12/05/2012  . Hyperlipemia 08/02/2012  . Hypertension   . Cardiac dysrhythmia, unspecified, history of   . BPH (benign prostatic hyperplasia)    Outpatient Encounter Prescriptions as of 08/28/2014  Medication Sig  . aspirin 81 MG EC tablet Take 81 mg by mouth daily.    Marland Kitchen atorvastatin (LIPITOR) 80 MG tablet Take 1 tablet (80 mg total) by mouth daily at 6 PM.  . Cholecalciferol (VITAMIN D-3) 5000 UNITS TABS Take 1 tablet by mouth daily.    Marland Kitchen ezetimibe (ZETIA) 10 MG tablet Take 1 tablet (10 mg total) by mouth daily.  . fenofibrate 160 MG tablet Take 1 tablet (160 mg total) by mouth daily.  Marland Kitchen levothyroxine (SYNTHROID, LEVOTHROID) 75 MCG tablet Take 1 tablet (75 mcg total) by mouth daily before breakfast.  . metFORMIN (GLUCOPHAGE-XR) 500 MG 24 hr tablet Take 1 tablet (500 mg total) by mouth 3 (three) times daily.  . Multiple Vitamin (MULTIVITAMIN WITH MINERALS) TABS tablet Take 1 tablet by mouth daily.  .  valsartan-hydrochlorothiazide (DIOVAN-HCT) 320-25 MG per tablet Take 1 tablet by mouth daily.   No facility-administered encounter medications on file as of 08/28/2014.      Review of Systems  Constitutional: Negative.   HENT: Negative.   Eyes: Negative.   Respiratory: Negative.   Cardiovascular: Negative.   Gastrointestinal: Negative.   Endocrine: Negative.   Genitourinary: Negative.   Musculoskeletal: Negative.   Skin: Negative.   Allergic/Immunologic: Negative.   Neurological: Negative.   Hematological: Negative.   Psychiatric/Behavioral: Negative.        Objective:   Physical Exam  Constitutional: He is oriented to person, place, and time. He appears well-developed and well-nourished. No distress.  HENT:  Head: Normocephalic and atraumatic.  Right Ear: External ear normal.  Left Ear: External ear normal.  Nose: Nose normal.  Mouth/Throat: Oropharynx is clear and moist. No oropharyngeal exudate.  Eyes: Conjunctivae and EOM are normal. Pupils are equal, round, and reactive to light. Right eye exhibits no discharge. Left eye exhibits no discharge. No scleral icterus.  Neck: Normal range of motion. Neck supple. No thyromegaly present.  No carotid bruits or anterior cervical adenopathy or thyromegaly  Cardiovascular: Normal rate, regular rhythm, normal heart sounds and intact distal pulses.   No murmur heard. The rhythm is regular at 72/m  Pulmonary/Chest: Effort normal and breath sounds normal. No respiratory distress. He has no wheezes. He has no rales. He exhibits  no tenderness.  Lungs are clear anteriorly and posteriorly. There is no axillary adenopathy.  Abdominal: Soft. Bowel sounds are normal. He exhibits no mass. There is no tenderness. There is no rebound and no guarding.  There were no abdominal bruits and no inguinal adenopathy  Genitourinary: Rectum normal and penis normal.  The prostate was enlarged but soft without lumps or masses. There were no rectal masses.  The external genitalia were normal and there were no inguinal hernias.  Musculoskeletal: Normal range of motion. He exhibits no edema or tenderness.  Lymphadenopathy:    He has no cervical adenopathy.  Neurological: He is alert and oriented to person, place, and time. He has normal reflexes. No cranial nerve deficit.  Skin: Skin is warm and dry. No rash noted. No erythema. No pallor.  Psychiatric: He has a normal mood and affect. His behavior is normal. Judgment and thought content normal.  Nursing note and vitals reviewed.   BP 116/71 mmHg  Pulse 94  Temp(Src) 97 F (36.1 C) (Oral)  Ht 5\' 10"  (1.778 m)  Wt 179 lb (81.194 kg)  BMI 25.68 kg/m2  EKG: Within normal limits      Assessment & Plan:  1. Hyperlipemia -Cholesterol numbers were good and the patient will continue with his current treatment - EKG 12-Lead  2. Hypertension -The blood pressure was good he will continue with current treatment - EKG 12-Lead  3. Type 2 diabetes mellitus without complication -The hemoglobin A1c was elevated and the patient admits to not exercising as regularly as he should. We will increase his metformin to the thousand milligrams twice daily and this prescription will be called into his  pharmacy. - POCT UA - Microalbumin - EKG 12-Lead  4. Hypothyroidism, unspecified hypothyroidism type -The patient is doing well as far as his thyroid is concerned and we will not make any changes with that medication.  5. BPH (benign prostatic hyperplasia) -The prostate remains enlarged but soft and smooth and we will continue to monitor this and he will get a PSA today. - PSA, total and free - POCT UA - Microscopic Only - POCT urinalysis dipstick  6. Vitamin D deficiency -The vitamin D level is good he will continue with current treatment  7. Decreased hemoglobin -The patient will have the hemoglobin rechecked today as this was a little bit lower than it has been in the past and this could be  attributed to his taking ibuprofen. - POCT hemoglobin  8. Blood sugar increased -Metformin will be increased to 1000 twice daily  Meds ordered this encounter  Medications  . fenofibrate 160 MG tablet    Sig: Take 1 tablet (160 mg total) by mouth daily.    Dispense:  90 tablet    Refill:  3  . atorvastatin (LIPITOR) 80 MG tablet    Sig: Take 1 tablet (80 mg total) by mouth daily at 6 PM.    Dispense:  90 tablet    Refill:  3   Patient Instructions                       Medicare Annual Wellness Visit  [facility] and the medical providers at [facility] strive to bring you the best medical care.  In doing so we not only want to address your current medical conditions and concerns but also to detect new conditions early and prevent illness, disease and health-related problems.    Medicare offers a yearly Wellness Visit which  allows our clinical staff to assess your need for preventative services including immunizations, lifestyle education, counseling to decrease risk of preventable diseases and screening for fall risk and other medical concerns.    This visit is provided free of charge (no copay) for all Medicare recipients. The clinical pharmacists at [facility] have begun to conduct these Wellness Visits which will also include a thorough review of all your medications.    As you primary medical provider recommend that you make an appointment for your Annual Wellness Visit if you have not done so already this year.  You may set up this appointment before you leave today or you may call back ([phone]) and schedule an appointment.  Please make sure when you call that you mention that you are scheduling your Annual Wellness Visit with the clinical pharmacist so that the appointment may be made for the proper length of time.     Continue current medications. Continue good therapeutic lifestyle changes which include good diet and  exercise. Fall precautions discussed with patient. If an FOBT was given today- please return it to our front desk. If you are over 75 years old - you may need Prevnar 64 or the adult Pneumonia vaccine.  Flu Shots are still available at our office. If you still haven't had one please call to set up a nurse visit to get one.   After your visit with [place] today you will receive a survey in the mail or online from [employer] regarding your care with [place]. Please take a moment to fill this out. Your feedback is very important to [place] as you can help [place] better understand your patient needs as well as improve your experience and satisfaction. WE CARE ABOUT YOU!!!   Please try to make a special effort to watch her diet more closely and become more active and to help bring the blood sugar down more. We're increasing the metformin to the thousand milligrams XR twice daily This summer drink plenty of fluids and stay well hydrated We will call you with the additional lab work results once they become available Always check your blood sugars regularly and bring readings to the next visit as well as a few blood pressure readings. The patient has not had an exercise treadmill test in a good while and we will arrange one this fall in the office because of his history of diabetes even though he is having no symptoms.   Arrie Senate MD

## 2014-08-29 LAB — PSA, TOTAL AND FREE
PSA FREE PCT: 22.6 %
PSA, Free: 0.43 ng/mL
Prostate Specific Ag, Serum: 1.9 ng/mL (ref 0.0–4.0)

## 2014-08-30 ENCOUNTER — Other Ambulatory Visit: Payer: Medicare Other

## 2014-08-30 DIAGNOSIS — Z1212 Encounter for screening for malignant neoplasm of rectum: Secondary | ICD-10-CM | POA: Diagnosis not present

## 2014-08-30 NOTE — Progress Notes (Signed)
Lab only 

## 2014-09-01 LAB — FECAL OCCULT BLOOD, IMMUNOCHEMICAL: Fecal Occult Bld: NEGATIVE

## 2014-09-03 NOTE — Progress Notes (Signed)
NA 08-04-14

## 2014-09-10 ENCOUNTER — Telehealth: Payer: Self-pay | Admitting: Family Medicine

## 2014-09-10 NOTE — Telephone Encounter (Signed)
Please look into what the problem is with metformin--- it is generic so why is it too expensive?

## 2014-09-18 ENCOUNTER — Encounter: Payer: Self-pay | Admitting: *Deleted

## 2014-09-25 MED ORDER — METFORMIN HCL 1000 MG PO TABS: 1000.0000 mg | ORAL_TABLET | Freq: Two times a day (BID) | ORAL | Status: DC

## 2014-09-25 NOTE — Telephone Encounter (Signed)
Please follow-up on why this medication is so expensive

## 2014-09-25 NOTE — Telephone Encounter (Signed)
Please review and advise.

## 2014-11-09 ENCOUNTER — Other Ambulatory Visit: Payer: Self-pay | Admitting: Family Medicine

## 2014-12-26 ENCOUNTER — Other Ambulatory Visit (INDEPENDENT_AMBULATORY_CARE_PROVIDER_SITE_OTHER): Payer: Medicare Other

## 2014-12-26 DIAGNOSIS — E119 Type 2 diabetes mellitus without complications: Secondary | ICD-10-CM

## 2014-12-26 DIAGNOSIS — I10 Essential (primary) hypertension: Secondary | ICD-10-CM | POA: Diagnosis not present

## 2014-12-26 DIAGNOSIS — E039 Hypothyroidism, unspecified: Secondary | ICD-10-CM

## 2014-12-26 DIAGNOSIS — N400 Enlarged prostate without lower urinary tract symptoms: Secondary | ICD-10-CM | POA: Diagnosis not present

## 2014-12-26 DIAGNOSIS — E785 Hyperlipidemia, unspecified: Secondary | ICD-10-CM | POA: Diagnosis not present

## 2014-12-26 DIAGNOSIS — E559 Vitamin D deficiency, unspecified: Secondary | ICD-10-CM

## 2014-12-26 LAB — POCT GLYCOSYLATED HEMOGLOBIN (HGB A1C): Hemoglobin A1C: 7.3

## 2014-12-26 NOTE — Progress Notes (Signed)
Lab only 

## 2014-12-27 LAB — BMP8+EGFR
BUN/Creatinine Ratio: 14 (ref 10–22)
BUN: 17 mg/dL (ref 8–27)
CO2: 21 mmol/L (ref 18–29)
Calcium: 10.4 mg/dL — ABNORMAL HIGH (ref 8.6–10.2)
Chloride: 100 mmol/L (ref 97–108)
Creatinine, Ser: 1.21 mg/dL (ref 0.76–1.27)
GFR calc Af Amer: 72 mL/min/{1.73_m2} (ref >59)
GFR calc non Af Amer: 62 mL/min/{1.73_m2} (ref >59)
GLUCOSE: 156 mg/dL — AB (ref 65–99)
POTASSIUM: 5.5 mmol/L — AB (ref 3.5–5.2)
Sodium: 140 mmol/L (ref 134–144)

## 2014-12-27 LAB — CBC WITH DIFFERENTIAL/PLATELET
Basophils Absolute: 0 10*3/uL (ref 0.0–0.2)
Basos: 0 %
EOS (ABSOLUTE): 0.1 10*3/uL (ref 0.0–0.4)
Eos: 1 %
Hematocrit: 40.3 % (ref 37.5–51.0)
Hemoglobin: 13.2 g/dL (ref 12.6–17.7)
IMMATURE GRANULOCYTES: 0 %
Immature Grans (Abs): 0 10*3/uL (ref 0.0–0.1)
Lymphocytes Absolute: 2.7 10*3/uL (ref 0.7–3.1)
Lymphs: 34 %
MCH: 30.4 pg (ref 26.6–33.0)
MCHC: 32.8 g/dL (ref 31.5–35.7)
MCV: 93 fL (ref 79–97)
MONOS ABS: 0.5 10*3/uL (ref 0.1–0.9)
Monocytes: 6 %
NEUTROS PCT: 59 %
Neutrophils Absolute: 4.6 10*3/uL (ref 1.4–7.0)
PLATELETS: 354 10*3/uL (ref 150–379)
RBC: 4.34 x10E6/uL (ref 4.14–5.80)
RDW: 13.6 % (ref 12.3–15.4)
WBC: 7.9 10*3/uL (ref 3.4–10.8)

## 2014-12-27 LAB — NMR, LIPOPROFILE
Cholesterol: 114 mg/dL (ref 100–199)
HDL CHOLESTEROL BY NMR: 36 mg/dL — AB (ref >39)
HDL Particle Number: 33.7 umol/L (ref >=30.5)
LDL PARTICLE NUMBER: 894 nmol/L (ref <1000)
LDL Size: 20.1 nm (ref >20.5)
LDL-C: 51 mg/dL (ref 0–99)
LP-IR Score: 66 — ABNORMAL HIGH (ref <=45)
SMALL LDL PARTICLE NUMBER: 586 nmol/L — AB (ref <=527)
TRIGLYCERIDES BY NMR: 135 mg/dL (ref 0–149)

## 2014-12-27 LAB — THYROID PANEL WITH TSH
FREE THYROXINE INDEX: 1.8 (ref 1.2–4.9)
T3 UPTAKE RATIO: 24 % (ref 24–39)
T4, Total: 7.4 ug/dL (ref 4.5–12.0)
TSH: 10.94 u[IU]/mL — ABNORMAL HIGH (ref 0.450–4.500)

## 2014-12-27 LAB — HEPATIC FUNCTION PANEL
ALT: 36 IU/L (ref 0–44)
AST: 31 IU/L (ref 0–40)
Albumin: 4.7 g/dL (ref 3.6–4.8)
Alkaline Phosphatase: 54 IU/L (ref 39–117)
Bilirubin Total: 0.4 mg/dL (ref 0.0–1.2)
Bilirubin, Direct: 0.21 mg/dL (ref 0.00–0.40)
Total Protein: 6.9 g/dL (ref 6.0–8.5)

## 2014-12-27 LAB — VITAMIN D 25 HYDROXY (VIT D DEFICIENCY, FRACTURES): Vit D, 25-Hydroxy: 41.2 ng/mL (ref 30.0–100.0)

## 2014-12-28 ENCOUNTER — Other Ambulatory Visit: Payer: Self-pay | Admitting: *Deleted

## 2014-12-28 MED ORDER — LEVOTHYROXINE SODIUM 75 MCG PO TABS: 75.0000 ug | ORAL_TABLET | Freq: Every day | ORAL | Status: DC

## 2014-12-28 NOTE — Addendum Note (Signed)
Addended by: Shelbie Ammons on: 12/28/2014 12:13 PM   Modules accepted: Orders

## 2015-01-09 ENCOUNTER — Encounter: Payer: Self-pay | Admitting: Family Medicine

## 2015-01-09 ENCOUNTER — Ambulatory Visit (INDEPENDENT_AMBULATORY_CARE_PROVIDER_SITE_OTHER): Payer: Medicare Other | Admitting: Family Medicine

## 2015-01-09 VITALS — BP 138/82 | HR 77 | Temp 97.4°F | Ht 70.0 in | Wt 175.0 lb

## 2015-01-09 DIAGNOSIS — E785 Hyperlipidemia, unspecified: Secondary | ICD-10-CM | POA: Diagnosis not present

## 2015-01-09 DIAGNOSIS — I10 Essential (primary) hypertension: Secondary | ICD-10-CM | POA: Diagnosis not present

## 2015-01-09 DIAGNOSIS — E119 Type 2 diabetes mellitus without complications: Secondary | ICD-10-CM | POA: Diagnosis not present

## 2015-01-09 DIAGNOSIS — E039 Hypothyroidism, unspecified: Secondary | ICD-10-CM | POA: Diagnosis not present

## 2015-01-09 DIAGNOSIS — N400 Enlarged prostate without lower urinary tract symptoms: Secondary | ICD-10-CM

## 2015-01-09 DIAGNOSIS — E559 Vitamin D deficiency, unspecified: Secondary | ICD-10-CM | POA: Diagnosis not present

## 2015-01-09 NOTE — Progress Notes (Signed)
Subjective:    Patient ID: Matthew Daugherty, male    DOB: 07/29/1945, 69 y.o.   MRN: [id number]  HPI Pt here for follow up and management of chronic medical problems which includes hypertension, hyperlipidemia, hypothyroid, and diabetes. He is taking medications regularly. The patient is doing well today with no specific complaints.The blood sugar is elevated at 156 and ideally this should be less than 100. This is lower than over the past several months however. The creatinine, the most important kidney function test is within normal limits. The electrolytes are good except the potassium is slightly elevated at 5.5 and his BMP should be repeated when he makes his office visit for confirmation.++++++++ also the serum calcium was slightly elevated and this will be on the blood work too. The CBC has a normal white blood cell count. The hemoglobin is good at 13.2 and the platelet count is adequate. All liver function tests are within normal limits Wynetta Emery all numbers with advanced lipid testing have a total LDL particle number that is good and at goal and improved from 4 months ago at 194. The LDL C remains good at 51. The triglycerides are good at 135. The good cholesterol or the HDL particle number remains within normal limits.------ the patient should continue with his atorvastatin and with as aggressive therapeutic lifestyle changes as possible which include diet and exercise The vitamin D level is good at 41.2.----- he should continue with vitamin D 3, 5000 units daily, and make sure that he takes this regularly. The TSH is elevated. This means that the patient is not getting enough thyroid medication and that his body needs more. Confirm that he is currently taking 75 g daily. Confirm that he is been taking it regularly. If he has, increase this to 100 g daily and repeat the thyroid profile in 6 weeks.++++++ It is important to note that the patient ran out of thyroid medicine prior to his recent blood  work so we told him we would just check another thyroid profile in about 6 weeks to make sure that no changes need to be made with this medication. Also his serum calcium and calcium were elevated and these will be repeated today. He is on her list to get a stress test and this will be done hopefully in the next month or so. The patient denies chest pain shortness of breath trouble swallowing heartburn and indigestion and nausea vomiting diarrhea or blood in the stool. He denies any black tarry bowel movements. He's passing his water without problems. He says that he recognizes his age is becoming a problem because he just doesn't have the get up and go that he used to have.        Patient Active Problem List   Diagnosis Date Noted  . Hypothyroid 12/05/2012  . Vitamin D deficiency 12/05/2012  . Type 2 diabetes mellitus 12/05/2012  . Hyperlipemia 08/02/2012  . Hypertension   . Cardiac dysrhythmia, unspecified, history of   . BPH (benign prostatic hyperplasia)    Outpatient Encounter Prescriptions as of 01/09/2015  Medication Sig  . aspirin 81 MG EC tablet Take 81 mg by mouth daily.    Marland Kitchen atorvastatin (LIPITOR) 80 MG tablet Take 1 tablet (80 mg total) by mouth daily at 6 PM.  . Cholecalciferol (VITAMIN D-3) 5000 UNITS TABS Take 1 tablet by mouth daily.    Marland Kitchen ezetimibe (ZETIA) 10 MG tablet Take 1 tablet (10 mg total) by mouth daily.  . fenofibrate 160  MG tablet Take 1 tablet (160 mg total) by mouth daily.  Marland Kitchen levothyroxine (SYNTHROID, LEVOTHROID) 75 MCG tablet Take 1 tablet (75 mcg total) by mouth daily before breakfast.  . metFORMIN (GLUCOPHAGE) 1000 MG tablet Take 1 tablet (1,000 mg total) by mouth 2 (two) times daily with a meal.  . Multiple Vitamin (MULTIVITAMIN WITH MINERALS) TABS tablet Take 1 tablet by mouth daily.  . valsartan-hydrochlorothiazide (DIOVAN-HCT) 320-25 MG per tablet TAKE 1 TABLET EVERY DAY   No facility-administered encounter medications on file as of 01/09/2015.       Review of Systems  Constitutional: Negative.   HENT: Negative.   Eyes: Negative.   Respiratory: Negative.   Cardiovascular: Negative.   Gastrointestinal: Negative.   Endocrine: Negative.   Genitourinary: Negative.   Musculoskeletal: Negative.   Skin: Negative.   Allergic/Immunologic: Negative.   Neurological: Negative.   Hematological: Negative.   Psychiatric/Behavioral: Negative.        Objective:   Physical Exam  Constitutional: He is oriented to person, place, and time. He appears well-developed and well-nourished. No distress.  HENT:  Head: Normocephalic and atraumatic.  Right Ear: External ear normal.  Left Ear: External ear normal.  Mouth/Throat: Oropharynx is clear and moist. No oropharyngeal exudate.  Nasal congestion bilaterally  Eyes: Conjunctivae and EOM are normal. Pupils are equal, round, and reactive to light. Right eye exhibits no discharge. Left eye exhibits no discharge. No scleral icterus.  Neck: Normal range of motion. Neck supple. No thyromegaly present.  Cardiovascular: Normal rate, regular rhythm, normal heart sounds and intact distal pulses.   No murmur heard. At 72/m  Pulmonary/Chest: Effort normal and breath sounds normal. No respiratory distress. He has no wheezes. He has no rales. He exhibits no tenderness.  Clear anteriorly and posteriorly without axillary adenopathy  Abdominal: Soft. Bowel sounds are normal. He exhibits no mass. There is no tenderness. There is no rebound and no guarding.  No abdominal tenderness or organ enlargement inguinal adenopathy or bruits  Musculoskeletal: Normal range of motion. He exhibits no edema or tenderness.  Lymphadenopathy:    He has no cervical adenopathy.  Neurological: He is alert and oriented to person, place, and time. He has normal reflexes. No cranial nerve deficit.  Skin: Skin is warm and dry. No rash noted.  Psychiatric: He has a normal mood and affect. His behavior is normal. Judgment and  thought content normal.  Nursing note and vitals reviewed.    BP 138/82 mmHg  Pulse 77  Temp(Src) 97.4 F (36.3 C) (Oral)  Ht _0  (1.778 m)  Wt 175 lb (79.379 kg)  BMI 25.11 kg/m2      Assessment & Plan:  1. Hyperlipemia -The patient will continue current treatment is all of his cholesterol numbers were excellent. - NMR, lipoprofile  2. Hypertension -The blood pressure is good today and he will continue with current treatment - BMP8+EGFR - Hepatic function panel  3. Type 2 diabetes mellitus without complication -His fasting blood sugar is elevated and his A1c was 7.3% and he understands he needs to work harder on getting A1c down closer to 7 or slightly below 7. - POCT glycosylated hemoglobin (Hb A1C) - BMP8+EGFR  4. Hypothyroidism, unspecified hypothyroidism type -The TSH was elevated and the patient admits to not taking thyroid medicine for several days before his blood work was drawn. We will plan to repeat the thyroid profile in about 6 weeks after he's been on his medication more regularly. - Thyroid Panel With TSH  5.  BPH (benign prostatic hyperplasia) -No problems with passing his water.  6. Vitamin D deficiency -He will continue with his current vitamin D dose pending  Patient Instructions                       Medicare Annual Wellness Visit  [facility] and the medical providers at [facility] strive to bring you the best medical care.  In doing so we not only want to address your current medical conditions and concerns but also to detect new conditions early and prevent illness, disease and health-related problems.    Medicare offers a yearly Wellness Visit which allows our clinical staff to assess your need for preventative services including immunizations, lifestyle education, counseling to decrease risk of preventable diseases and screening for fall risk and other medical concerns.    This visit is provided free of charge (no  copay) for all Medicare recipients. The clinical pharmacists at [facility] have begun to conduct these Wellness Visits which will also include a thorough review of all your medications.    As you primary medical provider recommend that you make an appointment for your Annual Wellness Visit if you have not done so already this year.  You may set up this appointment before you leave today or you may call back ([phone]) and schedule an appointment.  Please make sure when you call that you mention that you are scheduling your Annual Wellness Visit with the clinical pharmacist so that the appointment may be made for the proper length of time.     Continue current medications. Continue good therapeutic lifestyle changes which include good diet and exercise. Fall precautions discussed with patient. If an FOBT was given today- please return it to our front desk. If you are over 46 years old - you may need Prevnar 15 or the adult Pneumonia vaccine.  **Flu shots will be available soon--- please call and schedule a FLU-CLINIC appointment**  After your visit with [place] today you will receive a survey in the mail or online from [employer] regarding your care with [place]. Please take a moment to fill this out. Your feedback is very important to [place] as you can help [place] better understand your patient needs as well as improve your experience and satisfaction. WE CARE ABOUT YOU!!!   **Please join [place] [email or web address], 2016 from 5:00 to 7:00pm for our OPEN HOUSE! Come out and meet our NEW providers**  The patient should exercise regularly watch his diet closely and monitor his blood sugars closely. He should bring these readings to the next visit. He should drink plenty of water and fluids. We will call with additional lab work results as soon as those results become available Do not forget to get your flu shot Keep your appointment and make sure that the doctor that you see since [place] a copy of that  report You are still on the list to get an exercise treadmill test and we will call you with that time becomes available    Arrie Senate MD

## 2015-01-09 NOTE — Addendum Note (Signed)
Addended by: Zannie Cove on: 01/09/2015 12:34 PM   Modules accepted: Orders

## 2015-01-09 NOTE — Patient Instructions (Addendum)
Medicare Annual Wellness Visit  [facility] and the medical providers at [facility] strive to bring you the best medical care.  In doing so we not only want to address your current medical conditions and concerns but also to detect new conditions early and prevent illness, disease and health-related problems.    Medicare offers a yearly Wellness Visit which allows our clinical staff to assess your need for preventative services including immunizations, lifestyle education, counseling to decrease risk of preventable diseases and screening for fall risk and other medical concerns.    This visit is provided free of charge (no copay) for all Medicare recipients. The clinical pharmacists at [facility] have begun to conduct these Wellness Visits which will also include a thorough review of all your medications.    As you primary medical provider recommend that you make an appointment for your Annual Wellness Visit if you have not done so already this year.  You may set up this appointment before you leave today or you may call back ([phone]) and schedule an appointment.  Please make sure when you call that you mention that you are scheduling your Annual Wellness Visit with the clinical pharmacist so that the appointment may be made for the proper length of time.     Continue current medications. Continue good therapeutic lifestyle changes which include good diet and exercise. Fall precautions discussed with patient. If an FOBT was given today- please return it to our front desk. If you are over 45 years old - you may need Prevnar 73 or the adult Pneumonia vaccine.  **Flu shots will be available soon--- please call and schedule a FLU-CLINIC appointment**  After your visit with [place] today you will receive a survey in the mail or online from [employer] regarding your care with [place]. Please take a moment to fill this out. Your feedback is  very important to [place] as you can help [place] better understand your patient needs as well as improve your experience and satisfaction. WE CARE ABOUT YOU!!!   **Please join [place] [email or web address], 2016 from 5:00 to 7:00pm for our OPEN HOUSE! Come out and meet our NEW providers**  The patient should exercise regularly watch his diet closely and monitor his blood sugars closely. He should bring these readings to the next visit. He should drink plenty of water and fluids. We will call with additional lab work results as soon as those results become available Do not forget to get your flu shot Keep your appointment and make sure that the doctor that you see since [place] a copy of that report You are still on the list to get an exercise treadmill test and we will call you with that time becomes available

## 2015-01-10 ENCOUNTER — Other Ambulatory Visit: Payer: Self-pay | Admitting: Family Medicine

## 2015-01-10 LAB — BMP8+EGFR
BUN/Creatinine Ratio: 9 — ABNORMAL LOW (ref 10–22)
BUN: 10 mg/dL (ref 8–27)
CALCIUM: 10.1 mg/dL (ref 8.6–10.2)
CO2: 24 mmol/L (ref 18–29)
Chloride: 96 mmol/L — ABNORMAL LOW (ref 97–108)
Creatinine, Ser: 1.14 mg/dL (ref 0.76–1.27)
GFR, EST AFRICAN AMERICAN: 77 mL/min/{1.73_m2} (ref >59)
GFR, EST NON AFRICAN AMERICAN: 67 mL/min/{1.73_m2} (ref >59)
Glucose: 161 mg/dL — ABNORMAL HIGH (ref 65–99)
POTASSIUM: 4.6 mmol/L (ref 3.5–5.2)
Sodium: 138 mmol/L (ref 134–144)

## 2015-01-16 ENCOUNTER — Ambulatory Visit: Payer: Medicare Other | Admitting: Family Medicine

## 2015-02-01 DIAGNOSIS — H40033 Anatomical narrow angle, bilateral: Secondary | ICD-10-CM | POA: Diagnosis not present

## 2015-02-01 DIAGNOSIS — H2513 Age-related nuclear cataract, bilateral: Secondary | ICD-10-CM | POA: Diagnosis not present

## 2015-02-01 LAB — HM DIABETES EYE EXAM

## 2015-02-22 ENCOUNTER — Ambulatory Visit (INDEPENDENT_AMBULATORY_CARE_PROVIDER_SITE_OTHER): Payer: Medicare Other

## 2015-02-22 DIAGNOSIS — Z23 Encounter for immunization: Secondary | ICD-10-CM | POA: Diagnosis not present

## 2015-04-09 ENCOUNTER — Telehealth: Payer: Self-pay | Admitting: Family Medicine

## 2015-05-24 ENCOUNTER — Other Ambulatory Visit: Payer: Self-pay | Admitting: Family Medicine

## 2015-05-29 ENCOUNTER — Other Ambulatory Visit: Payer: Self-pay | Admitting: Family Medicine

## 2015-05-29 ENCOUNTER — Other Ambulatory Visit (INDEPENDENT_AMBULATORY_CARE_PROVIDER_SITE_OTHER): Payer: Medicare Other

## 2015-05-29 DIAGNOSIS — I10 Essential (primary) hypertension: Secondary | ICD-10-CM | POA: Diagnosis not present

## 2015-05-29 DIAGNOSIS — E039 Hypothyroidism, unspecified: Secondary | ICD-10-CM

## 2015-05-29 DIAGNOSIS — E785 Hyperlipidemia, unspecified: Secondary | ICD-10-CM

## 2015-05-29 DIAGNOSIS — E559 Vitamin D deficiency, unspecified: Secondary | ICD-10-CM

## 2015-05-29 DIAGNOSIS — E119 Type 2 diabetes mellitus without complications: Secondary | ICD-10-CM

## 2015-05-29 LAB — POCT GLYCOSYLATED HEMOGLOBIN (HGB A1C): Hemoglobin A1C: 7.4

## 2015-05-30 LAB — CBC WITH DIFFERENTIAL/PLATELET
BASOS ABS: 0 10*3/uL (ref 0.0–0.2)
Basos: 0 %
EOS (ABSOLUTE): 0.1 10*3/uL (ref 0.0–0.4)
Eos: 1 %
Hematocrit: 40 % (ref 37.5–51.0)
Hemoglobin: 13.1 g/dL (ref 12.6–17.7)
IMMATURE GRANS (ABS): 0 10*3/uL (ref 0.0–0.1)
Immature Granulocytes: 0 %
LYMPHS: 39 %
Lymphocytes Absolute: 3 10*3/uL (ref 0.7–3.1)
MCH: 28.9 pg (ref 26.6–33.0)
MCHC: 32.8 g/dL (ref 31.5–35.7)
MCV: 88 fL (ref 79–97)
Monocytes Absolute: 0.5 10*3/uL (ref 0.1–0.9)
Monocytes: 7 %
NEUTROS ABS: 4.1 10*3/uL (ref 1.4–7.0)
NEUTROS PCT: 53 %
PLATELETS: 371 10*3/uL (ref 150–379)
RBC: 4.54 x10E6/uL (ref 4.14–5.80)
RDW: 13.9 % (ref 12.3–15.4)
WBC: 7.7 10*3/uL (ref 3.4–10.8)

## 2015-05-30 LAB — HEPATIC FUNCTION PANEL
ALT: 36 IU/L (ref 0–44)
AST: 31 IU/L (ref 0–40)
Albumin: 4.6 g/dL (ref 3.6–4.8)
Alkaline Phosphatase: 58 IU/L (ref 39–117)
BILIRUBIN, DIRECT: 0.23 mg/dL (ref 0.00–0.40)
Bilirubin Total: 0.4 mg/dL (ref 0.0–1.2)
TOTAL PROTEIN: 6.9 g/dL (ref 6.0–8.5)

## 2015-05-30 LAB — BMP8+EGFR
BUN/Creatinine Ratio: 14 (ref 10–22)
BUN: 17 mg/dL (ref 8–27)
CALCIUM: 9.7 mg/dL (ref 8.6–10.2)
CHLORIDE: 98 mmol/L (ref 96–106)
CO2: 22 mmol/L (ref 18–29)
Creatinine, Ser: 1.2 mg/dL (ref 0.76–1.27)
GFR calc non Af Amer: 62 mL/min/{1.73_m2} (ref >59)
GFR, EST AFRICAN AMERICAN: 72 mL/min/{1.73_m2} (ref >59)
Glucose: 123 mg/dL — ABNORMAL HIGH (ref 65–99)
POTASSIUM: 5 mmol/L (ref 3.5–5.2)
SODIUM: 138 mmol/L (ref 134–144)

## 2015-05-30 LAB — NMR, LIPOPROFILE

## 2015-05-30 LAB — VITAMIN D 25 HYDROXY (VIT D DEFICIENCY, FRACTURES): Vit D, 25-Hydroxy: 50.8 ng/mL (ref 30.0–100.0)

## 2015-05-30 LAB — NO SPECIMEN RECEIVED

## 2015-05-30 LAB — SPECIMEN STATUS REPORT

## 2015-05-31 LAB — NMR, LIPOPROFILE
Cholesterol: 114 mg/dL (ref 100–199)
HDL CHOLESTEROL BY NMR: 36 mg/dL — AB (ref >39)
HDL Particle Number: 34.3 umol/L (ref >=30.5)
LDL PARTICLE NUMBER: 906 nmol/L (ref <1000)
LDL Size: 20.2 nm (ref >20.5)
LDL-C: 48 mg/dL (ref 0–99)
LP-IR Score: 75 — ABNORMAL HIGH (ref <=45)
SMALL LDL PARTICLE NUMBER: 570 nmol/L — AB (ref <=527)
TRIGLYCERIDES BY NMR: 148 mg/dL (ref 0–149)

## 2015-05-31 LAB — LIPID PANEL W/O CHOL/HDL RATIO
Cholesterol, Total: 110 mg/dL (ref 100–199)
HDL: 35 mg/dL — ABNORMAL LOW (ref >39)
LDL CALC: 44 mg/dL (ref 0–99)
TRIGLYCERIDES: 153 mg/dL — AB (ref 0–149)
VLDL Cholesterol Cal: 31 mg/dL (ref 5–40)

## 2015-05-31 LAB — SPECIMEN STATUS REPORT

## 2015-06-12 ENCOUNTER — Ambulatory Visit (INDEPENDENT_AMBULATORY_CARE_PROVIDER_SITE_OTHER): Payer: Medicare Other | Admitting: Family Medicine

## 2015-06-12 ENCOUNTER — Encounter: Payer: Self-pay | Admitting: Family Medicine

## 2015-06-12 ENCOUNTER — Ambulatory Visit (INDEPENDENT_AMBULATORY_CARE_PROVIDER_SITE_OTHER): Payer: Medicare Other

## 2015-06-12 VITALS — BP 132/77 | HR 88 | Temp 97.1°F | Ht 70.0 in | Wt 168.0 lb

## 2015-06-12 DIAGNOSIS — E119 Type 2 diabetes mellitus without complications: Secondary | ICD-10-CM

## 2015-06-12 DIAGNOSIS — I10 Essential (primary) hypertension: Secondary | ICD-10-CM | POA: Diagnosis not present

## 2015-06-12 DIAGNOSIS — R1010 Upper abdominal pain, unspecified: Secondary | ICD-10-CM

## 2015-06-12 DIAGNOSIS — E785 Hyperlipidemia, unspecified: Secondary | ICD-10-CM | POA: Diagnosis not present

## 2015-06-12 DIAGNOSIS — E559 Vitamin D deficiency, unspecified: Secondary | ICD-10-CM

## 2015-06-12 DIAGNOSIS — E039 Hypothyroidism, unspecified: Secondary | ICD-10-CM

## 2015-06-12 DIAGNOSIS — R112 Nausea with vomiting, unspecified: Secondary | ICD-10-CM

## 2015-06-12 MED ORDER — EZETIMIBE 10 MG PO TABS: 10.0000 mg | ORAL_TABLET | Freq: Every day | ORAL | Status: DC

## 2015-06-12 NOTE — Patient Instructions (Addendum)
Medicare Annual Wellness Visit  [facility] and the medical providers at [facility] strive to bring you the best medical care.  In doing so we not only want to address your current medical conditions and concerns but also to detect new conditions early and prevent illness, disease and health-related problems.    Medicare offers a yearly Wellness Visit which allows our clinical staff to assess your need for preventative services including immunizations, lifestyle education, counseling to decrease risk of preventable diseases and screening for fall risk and other medical concerns.    This visit is provided free of charge (no copay) for all Medicare recipients. The clinical pharmacists at [facility] have begun to conduct these Wellness Visits which will also include a thorough review of all your medications.    As you primary medical provider recommend that you make an appointment for your Annual Wellness Visit if you have not done so already this year.  You may set up this appointment before you leave today or you may call back [phone] and schedule an appointment.  Please make sure when you call that you mention that you are scheduling your Annual Wellness Visit with the clinical pharmacist so that the appointment may be made for the proper length of time.     Continue current medications. Continue good therapeutic lifestyle changes which include good diet and exercise. Fall precautions discussed with patient. If an FOBT was given today- please return it to our front desk. If you are over 80 years old - you may need Prevnar 32 or the adult Pneumonia vaccine.  **Flu shots are available--- please call and schedule a FLU-CLINIC appointment**  After your visit with [place] today you will receive a survey in the mail or online from [employer] regarding your care with [place]. Please take a moment to fill this out. Your feedback is very  important to [place] as you can help [place] better understand your patient needs as well as improve your experience and satisfaction. WE CARE ABOUT YOU!!!   The patient will try to do a better job and watch the fried foods more closely We will schedule him for CT scan of the abdomen and pelvis. This is because of the abdominal pain that he has been experiencing more frequently. When the results are back from the CT scan he may need referral to the gastroenterologist or the surgeon and we will wait for those results first The patient should try to do better with his diet and get his hemoglobin A1c down to a lower number than 7.4. We will call with the results of the chest x-ray as soon as those results become available

## 2015-06-12 NOTE — Progress Notes (Signed)
Subjective:    Patient ID: Matthew Daugherty, male    DOB: 19-Mar-1946, 70 y.o.   MRN: [id number]  HPI  Pt here for follow up and management of chronic medical problems which includes diabetes, hypertension, and hyperlipidemia. He is taking medications regularly. The patient has had some episodes of pain and pressure and nausea and vomiting at times. He has seen Dr. May God, the gastroenterologist. He will get a chest x-ray today and has had his lab work done this will be reviewed with him during the visit. He is requesting a refill on zetia. Cholesterol numbers with traditional lipid testing have a total LDL C that is good and at goal at 44. Triglycerides are slightly elevated at 153. The total cholesterol is good at 110. The patient should continue with his current treatment and should continue to watch his diet as closely as possible and get as much exercise as possible. The blood sugar is elevated at 123. The creatinine, the most important kidney function test remains within normal limits. The electrolytes including potassium are good. The CBC has a normal white blood cell count. The hemoglobin is good and stable at 13.1 and the platelet count is adequate. All returned liver function tests are within normal limits The vitamin D level is good at 50.8 and the patient should continue with current treatment The hemoglobin A1c is 7.4% and this means that the average blood sugar for the past 3 months has been running a little bit higher than we would like for 2 and we will discuss this with the patient when we see him. He needs to adhere more closely to his diet and for the time being intended to take his current medications The patient has had more frequent episodes of abdominal pain and bloating one in December, one in January and one recently in February. Sometimes after having the episode it takes several days to recover. He denies chest pain or shortness of breath. He asked he denies heartburn or  indigestion. He just has these episodes of fullness pressure abdominal pain and nausea and vomiting. He's passing his water without problems and has not seen any blood in the urine or stool or had any black tarry bowel movements.    Patient Active Problem List   Diagnosis Date Noted  . Hypothyroid 12/05/2012  . Vitamin D deficiency 12/05/2012  . Type 2 diabetes mellitus ([place]) 12/05/2012  . Hyperlipemia 08/02/2012  . Hypertension   . Cardiac dysrhythmia, unspecified, history of   . BPH (benign prostatic hyperplasia)    Outpatient Encounter Prescriptions as of 06/12/2015  Medication Sig  . aspirin 81 MG EC tablet Take 81 mg by mouth daily.    Marland Kitchen atorvastatin (LIPITOR) 80 MG tablet Take 1 tablet (80 mg total) by mouth daily at 6 PM.  . Cholecalciferol (VITAMIN D-3) 5000 UNITS TABS Take 1 tablet by mouth daily.    Marland Kitchen ezetimibe (ZETIA) 10 MG tablet Take 1 tablet (10 mg total) by mouth daily.  . fenofibrate 160 MG tablet Take 1 tablet (160 mg total) by mouth daily.  Marland Kitchen levothyroxine (SYNTHROID, LEVOTHROID) 75 MCG tablet Take 1 tablet (75 mcg total) by mouth daily before breakfast.  . metFORMIN (GLUCOPHAGE) 1000 MG tablet Take 1 tablet (1,000 mg total) by mouth 2 (two) times daily with a meal.  . Multiple Vitamin (MULTIVITAMIN WITH MINERALS) TABS tablet Take 1 tablet by mouth daily.  . valsartan-hydrochlorothiazide (DIOVAN-HCT) 320-25 MG tablet TAKE 1 TABLET EVERY DAY   No facility-administered  encounter medications on file as of 06/12/2015.     Review of Systems  Constitutional: Negative.   HENT: Negative.   Eyes: Negative.   Respiratory: Negative.   Cardiovascular: Negative.   Gastrointestinal: Positive for nausea, vomiting and abdominal pain.  Endocrine: Negative.   Genitourinary: Negative.   Musculoskeletal: Negative.   Skin: Negative.   Allergic/Immunologic: Negative.   Neurological: Negative.   Hematological: Negative.   Psychiatric/Behavioral: Negative.        Objective:    Physical Exam  Constitutional: He is oriented to person, place, and time. He appears well-developed and well-nourished. No distress.  HENT:  Head: Normocephalic and atraumatic.  Right Ear: External ear normal.  Left Ear: External ear normal.  Nose: Nose normal.  Mouth/Throat: Oropharynx is clear and moist. No oropharyngeal exudate.  Eyes: Conjunctivae and EOM are normal. Pupils are equal, round, and reactive to light. Right eye exhibits no discharge. Left eye exhibits no discharge. No scleral icterus.  Neck: Normal range of motion. Neck supple. No thyromegaly present.  Cardiovascular: Normal rate, regular rhythm, normal heart sounds and intact distal pulses.  Exam reveals no gallop and no friction rub.   No murmur heard. Pulmonary/Chest: Effort normal and breath sounds normal. No respiratory distress. He has no wheezes. He has no rales. He exhibits no tenderness.  No axillary adenopathy  Abdominal: Soft. Bowel sounds are normal. He exhibits no mass. There is no tenderness. There is no rebound and no guarding.  No abdominal tenderness or organ enlargement or bruits. No inguinal adenopathy.  Musculoskeletal: Normal range of motion. He exhibits no edema.  Lymphadenopathy:    He has no cervical adenopathy.  Neurological: He is alert and oriented to person, place, and time. He has normal reflexes. No cranial nerve deficit.  Skin: Skin is warm and dry. No rash noted.  Psychiatric: He has a normal mood and affect. His behavior is normal. Judgment and thought content normal.  Nursing note and vitals reviewed.  BP 132/77 mmHg  Pulse 88  Temp(Src) 97.1 F (36.2 C) (Oral)  Ht 5\' 10"  (1.778 m)  Wt 168 lb (76.204 kg)  BMI 24.11 kg/m2  [facility] reading (PRIMARY) by  Dr. Brunilda Payor x-ray results pending                                       Assessment & Plan:  1. Hyperlipemia -Continue with current treatment and aggressive therapeutic lifestyle changes  2. Hypertension -The blood pressure is  good today he should continue with current treatment we will call with the chest x-ray results as soon as they become available - DG Chest 2 View; Future  3. Type 2 diabetes mellitus without complication, without long-term current use of insulin (HCC) -Hemoglobin A1c was 7.4% and the patient will make more efforts to watch his diet more closely and get more exercise. He will continue with current treatment - DG Chest 2 View; Future  4. Hypothyroidism, unspecified hypothyroidism type -Continue with current treatment  5. Vitamin D deficiency -Continue with current treatment  6. Pain of upper abdomen -Return another FOBT card - CT Abdomen Pelvis Wo Contrast; Future  7. Nausea and vomiting, vomiting of unspecified type -Get CT scan and possible referral to gastroenterology or surgery - CT Abdomen Pelvis Wo Contrast; Future  Meds ordered this encounter  Medications  . ezetimibe (ZETIA) 10 MG tablet    Sig: Take 1 tablet (10 mg  total) by mouth daily.    Dispense:  90 tablet    Refill:  3   Patient Instructions                       Medicare Annual Wellness Visit  [facility] and the medical providers at [facility] strive to bring you the best medical care.  In doing so we not only want to address your current medical conditions and concerns but also to detect new conditions early and prevent illness, disease and health-related problems.    Medicare offers a yearly Wellness Visit which allows our clinical staff to assess your need for preventative services including immunizations, lifestyle education, counseling to decrease risk of preventable diseases and screening for fall risk and other medical concerns.    This visit is provided free of charge (no copay) for all Medicare recipients. The clinical pharmacists at [facility] have begun to conduct these Wellness Visits which will also include a thorough review of all your medications.    As  you primary medical provider recommend that you make an appointment for your Annual Wellness Visit if you have not done so already this year.  You may set up this appointment before you leave today or you may call back [phone] and schedule an appointment.  Please make sure when you call that you mention that you are scheduling your Annual Wellness Visit with the clinical pharmacist so that the appointment may be made for the proper length of time.     Continue current medications. Continue good therapeutic lifestyle changes which include good diet and exercise. Fall precautions discussed with patient. If an FOBT was given today- please return it to our front desk. If you are over 4 years old - you may need Prevnar 80 or the adult Pneumonia vaccine.  **Flu shots are available--- please call and schedule a FLU-CLINIC appointment**  After your visit with [place] today you will receive a survey in the mail or online from [employer] regarding your care with [place]. Please take a moment to fill this out. Your feedback is very important to [place] as you can help [place] better understand your patient needs as well as improve your experience and satisfaction. WE CARE ABOUT YOU!!!   The patient will try to do a better job and watch the fried foods more closely We will schedule him for CT scan of the abdomen and pelvis. This is because of the abdominal pain that he has been experiencing more frequently. When the results are back from the CT scan he may need referral to the gastroenterologist or the surgeon and we will wait for those results first The patient should try to do better with his diet and get his hemoglobin A1c down to a lower number than 7.4. We will call with the results of the chest x-ray as soon as those results become available   Arrie Senate MD

## 2015-06-14 ENCOUNTER — Other Ambulatory Visit: Payer: Medicare Other

## 2015-06-14 DIAGNOSIS — Z1212 Encounter for screening for malignant neoplasm of rectum: Secondary | ICD-10-CM

## 2015-06-14 NOTE — Progress Notes (Signed)
Lab only 

## 2015-06-17 LAB — FECAL OCCULT BLOOD, IMMUNOCHEMICAL: FECAL OCCULT BLD: NEGATIVE

## 2015-06-19 ENCOUNTER — Other Ambulatory Visit: Payer: Self-pay | Admitting: *Deleted

## 2015-06-19 ENCOUNTER — Encounter (HOSPITAL_COMMUNITY): Payer: Self-pay | Admitting: Internal Medicine

## 2015-06-19 ENCOUNTER — Emergency Department (HOSPITAL_COMMUNITY)
Admission: EM | Admit: 2015-06-19 | Discharge: 2015-06-19 | Discharge status: Absent without leave | Payer: Medicare Other | Source: Home / Self Care

## 2015-06-19 ENCOUNTER — Inpatient Hospital Stay (HOSPITAL_COMMUNITY)
Admission: RE | Admit: 2015-06-19 | Discharge: 2015-06-23 | DRG: 418 | Disposition: A | Discharge status: Home / Self Care | Payer: Medicare Other | Source: Ambulatory Visit | Attending: Internal Medicine | Admitting: Internal Medicine

## 2015-06-19 ENCOUNTER — Encounter: Payer: Self-pay | Admitting: Family Medicine

## 2015-06-19 ENCOUNTER — Ambulatory Visit (INDEPENDENT_AMBULATORY_CARE_PROVIDER_SITE_OTHER): Payer: Medicare Other | Admitting: Family Medicine

## 2015-06-19 ENCOUNTER — Ambulatory Visit (HOSPITAL_COMMUNITY)
Admission: RE | Admit: 2015-06-19 | Discharge: 2015-06-19 | Disposition: A | Discharge status: Home / Self Care | Payer: Medicare Other | Source: Ambulatory Visit | Attending: Family Medicine | Admitting: Family Medicine

## 2015-06-19 VITALS — BP 113/70 | HR 107 | Temp 97.2°F | Ht 70.0 in | Wt 164.0 lb

## 2015-06-19 DIAGNOSIS — R112 Nausea with vomiting, unspecified: Secondary | ICD-10-CM

## 2015-06-19 DIAGNOSIS — R935 Abnormal findings on diagnostic imaging of other abdominal regions, including retroperitoneum: Secondary | ICD-10-CM | POA: Diagnosis not present

## 2015-06-19 DIAGNOSIS — R1011 Right upper quadrant pain: Secondary | ICD-10-CM | POA: Diagnosis not present

## 2015-06-19 DIAGNOSIS — I152 Hypertension secondary to endocrine disorders: Secondary | ICD-10-CM | POA: Diagnosis present

## 2015-06-19 DIAGNOSIS — I10 Essential (primary) hypertension: Secondary | ICD-10-CM | POA: Diagnosis present

## 2015-06-19 DIAGNOSIS — Z8673 Personal history of transient ischemic attack (TIA), and cerebral infarction without residual deficits: Secondary | ICD-10-CM

## 2015-06-19 DIAGNOSIS — K851 Biliary acute pancreatitis without necrosis or infection: Secondary | ICD-10-CM | POA: Diagnosis not present

## 2015-06-19 DIAGNOSIS — R748 Abnormal levels of other serum enzymes: Secondary | ICD-10-CM | POA: Diagnosis present

## 2015-06-19 DIAGNOSIS — K819 Cholecystitis, unspecified: Secondary | ICD-10-CM

## 2015-06-19 DIAGNOSIS — I2510 Atherosclerotic heart disease of native coronary artery without angina pectoris: Secondary | ICD-10-CM

## 2015-06-19 DIAGNOSIS — E119 Type 2 diabetes mellitus without complications: Secondary | ICD-10-CM | POA: Diagnosis not present

## 2015-06-19 DIAGNOSIS — Z7984 Long term (current) use of oral hypoglycemic drugs: Secondary | ICD-10-CM

## 2015-06-19 DIAGNOSIS — K8010 Calculus of gallbladder with chronic cholecystitis without obstruction: Secondary | ICD-10-CM | POA: Diagnosis not present

## 2015-06-19 DIAGNOSIS — E1159 Type 2 diabetes mellitus with other circulatory complications: Secondary | ICD-10-CM | POA: Diagnosis present

## 2015-06-19 DIAGNOSIS — R1010 Upper abdominal pain, unspecified: Secondary | ICD-10-CM | POA: Diagnosis not present

## 2015-06-19 DIAGNOSIS — R110 Nausea: Secondary | ICD-10-CM

## 2015-06-19 DIAGNOSIS — N400 Enlarged prostate without lower urinary tract symptoms: Secondary | ICD-10-CM | POA: Diagnosis present

## 2015-06-19 DIAGNOSIS — Z808 Family history of malignant neoplasm of other organs or systems: Secondary | ICD-10-CM

## 2015-06-19 DIAGNOSIS — K429 Umbilical hernia without obstruction or gangrene: Secondary | ICD-10-CM | POA: Diagnosis present

## 2015-06-19 DIAGNOSIS — Z888 Allergy status to other drugs, medicaments and biological substances status: Secondary | ICD-10-CM | POA: Diagnosis not present

## 2015-06-19 DIAGNOSIS — K861 Other chronic pancreatitis: Secondary | ICD-10-CM | POA: Diagnosis not present

## 2015-06-19 DIAGNOSIS — Z87891 Personal history of nicotine dependence: Secondary | ICD-10-CM

## 2015-06-19 DIAGNOSIS — E1169 Type 2 diabetes mellitus with other specified complication: Secondary | ICD-10-CM | POA: Diagnosis present

## 2015-06-19 DIAGNOSIS — E785 Hyperlipidemia, unspecified: Secondary | ICD-10-CM | POA: Diagnosis present

## 2015-06-19 DIAGNOSIS — R1084 Generalized abdominal pain: Secondary | ICD-10-CM

## 2015-06-19 DIAGNOSIS — Z79899 Other long term (current) drug therapy: Secondary | ICD-10-CM

## 2015-06-19 DIAGNOSIS — K859 Acute pancreatitis without necrosis or infection, unspecified: Secondary | ICD-10-CM | POA: Diagnosis present

## 2015-06-19 DIAGNOSIS — K8020 Calculus of gallbladder without cholecystitis without obstruction: Secondary | ICD-10-CM

## 2015-06-19 DIAGNOSIS — Z825 Family history of asthma and other chronic lower respiratory diseases: Secondary | ICD-10-CM

## 2015-06-19 DIAGNOSIS — Z8249 Family history of ischemic heart disease and other diseases of the circulatory system: Secondary | ICD-10-CM

## 2015-06-19 DIAGNOSIS — Q453 Other congenital malformations of pancreas and pancreatic duct: Secondary | ICD-10-CM

## 2015-06-19 DIAGNOSIS — R945 Abnormal results of liver function studies: Secondary | ICD-10-CM

## 2015-06-19 DIAGNOSIS — E039 Hypothyroidism, unspecified: Secondary | ICD-10-CM | POA: Diagnosis present

## 2015-06-19 DIAGNOSIS — Z7982 Long term (current) use of aspirin: Secondary | ICD-10-CM | POA: Diagnosis not present

## 2015-06-19 DIAGNOSIS — R109 Unspecified abdominal pain: Secondary | ICD-10-CM

## 2015-06-19 DIAGNOSIS — R630 Anorexia: Secondary | ICD-10-CM

## 2015-06-19 DIAGNOSIS — R7989 Other specified abnormal findings of blood chemistry: Secondary | ICD-10-CM | POA: Diagnosis present

## 2015-06-19 MED ORDER — ONDANSETRON HCL 4 MG PO TABS: 4.0000 mg | ORAL_TABLET | Freq: Four times a day (QID) | ORAL | Status: DC | PRN

## 2015-06-19 MED ORDER — HYDRALAZINE HCL 20 MG/ML IJ SOLN: 10.0000 mg | INTRAMUSCULAR | Status: DC | PRN

## 2015-06-19 MED ORDER — ONDANSETRON HCL 4 MG/2ML IJ SOLN: 4.0000 mg | Freq: Four times a day (QID) | INTRAMUSCULAR | Status: DC | PRN

## 2015-06-19 MED ORDER — ACETAMINOPHEN 325 MG PO TABS
650.0000 mg | ORAL_TABLET | Freq: Four times a day (QID) | ORAL | Status: DC | PRN
  Administered 2015-06-23: 650 mg via ORAL
  Filled 2015-06-19: qty 2

## 2015-06-19 MED ORDER — LEVOTHYROXINE SODIUM 100 MCG IV SOLR
37.5000 ug | Freq: Every day | INTRAVENOUS | Status: DC
  Administered 2015-06-20 – 2015-06-21 (×2): 37.5 ug via INTRAVENOUS
  Filled 2015-06-19 (×3): qty 5

## 2015-06-19 MED ORDER — SODIUM CHLORIDE 0.9 % IV SOLN
INTRAVENOUS | Status: AC
  Administered 2015-06-20: 100 mL/h via INTRAVENOUS
  Administered 2015-06-20: via INTRAVENOUS

## 2015-06-19 MED ORDER — INSULIN ASPART 100 UNIT/ML ~~LOC~~ SOLN
0.0000 [IU] | SUBCUTANEOUS | Status: DC
  Administered 2015-06-20 (×2): 1 [IU] via SUBCUTANEOUS
  Administered 2015-06-22: 3 [IU] via SUBCUTANEOUS
  Administered 2015-06-22 (×2): 2 [IU] via SUBCUTANEOUS
  Administered 2015-06-23: 1 [IU] via SUBCUTANEOUS

## 2015-06-19 MED ORDER — MORPHINE SULFATE (PF) 2 MG/ML IV SOLN: 1.0000 mg | INTRAVENOUS | Status: DC | PRN

## 2015-06-19 MED ORDER — ACETAMINOPHEN 650 MG RE SUPP: 650.0000 mg | Freq: Four times a day (QID) | RECTAL | Status: DC | PRN

## 2015-06-19 NOTE — Progress Notes (Signed)
70 year old male with a history of diabetes mellitus, hyperlipidemia, hypertension, hypothyroidism was seen at Dr. Tawanna Sat office today secondary to worsening epigastric and right upper quadrant abdominal pain that has been present for one month. The abdominal pain has worsened over the past week. The patient has had remission nausea and vomiting with meals associated with increasing abdominal pain. CT of the abdomen and pelvis was performed on 06/19/2015 which revealed cholelithiasis with fullness of the pancreatic head with surrounding fat stranding concerning for possible pancreatitis. There was also thickening and adjacent duodenal folds. The patient was hemodynamically stable and afebrile in the office. Direct admission was requested for workup of possible gallstone pancreatitis. Patient accepted for med-surg bed [facility]

## 2015-06-19 NOTE — Progress Notes (Signed)
Subjective:    Patient ID: Matthew Daugherty, male    DOB: [birth year], 70 y.o.   MRN: [id number]  HPI Patient here today for continued abdominal pain and to review CT scan that was done earlier today. The patient says his abdominal pain has been more sustained. If he eats he feels like he is going to throw up and he has very little appetite. His CT scan report was reviewed with him that was done this morning. He has coronary atherosclerosis. He has a pulmonary nodule in the right base of these to be followed up in 6 months. He has gallstones. He has an abnormal pancreas near the head of the pancreas that may reflect some pancreatitis according to the radiologist. We will do additional blood work and attempt to get him in the hospital for monitoring and make sure that he is improving.      Patient Active Problem List   Diagnosis Date Noted  . Hypothyroid 12/05/2012  . Vitamin D deficiency 12/05/2012  . Type 2 diabetes mellitus ([place]) 12/05/2012  . Hyperlipemia 08/02/2012  . Hypertension   . Cardiac dysrhythmia, unspecified, history of   . BPH (benign prostatic hyperplasia)    Outpatient Encounter Prescriptions as of 06/19/2015  Medication Sig  . aspirin 81 MG EC tablet Take 81 mg by mouth daily.    Marland Kitchen atorvastatin (LIPITOR) 80 MG tablet Take 1 tablet (80 mg total) by mouth daily at 6 PM.  . Cholecalciferol (VITAMIN D-3) 5000 UNITS TABS Take 1 tablet by mouth daily.    Marland Kitchen ezetimibe (ZETIA) 10 MG tablet Take 1 tablet (10 mg total) by mouth daily.  . fenofibrate 160 MG tablet Take 1 tablet (160 mg total) by mouth daily.  Marland Kitchen levothyroxine (SYNTHROID, LEVOTHROID) 75 MCG tablet Take 1 tablet (75 mcg total) by mouth daily before breakfast.  . metFORMIN (GLUCOPHAGE) 1000 MG tablet Take 1 tablet (1,000 mg total) by mouth 2 (two) times daily with a meal.  . Multiple Vitamin (MULTIVITAMIN WITH MINERALS) TABS tablet Take 1 tablet by mouth daily.  . valsartan-hydrochlorothiazide (DIOVAN-HCT) 320-25 MG tablet  TAKE 1 TABLET EVERY DAY   No facility-administered encounter medications on file as of 06/19/2015.      Review of Systems  Constitutional: Negative.   HENT: Negative.   Eyes: Negative.   Respiratory: Negative.   Cardiovascular: Negative.   Gastrointestinal: Positive for nausea, vomiting (occasional) and abdominal pain.  Endocrine: Negative.   Genitourinary: Negative.   Musculoskeletal: Negative.   Skin: Negative.   Allergic/Immunologic: Negative.   Neurological: Negative.   Hematological: Negative.   Psychiatric/Behavioral: Negative.        Objective:   Physical Exam  Constitutional: He is oriented to person, place, and time. He appears well-developed and well-nourished. He appears distressed.  HENT:  Head: Normocephalic and atraumatic.  Eyes: Conjunctivae and EOM are normal. Pupils are equal, round, and reactive to light. Right eye exhibits no discharge. Left eye exhibits no discharge. No scleral icterus.  Neck: Normal range of motion. Neck supple. No thyromegaly present.  Cardiovascular: Normal rate, regular rhythm and normal heart sounds.  Exam reveals no gallop and no friction rub.   No murmur heard. At 96/m  Pulmonary/Chest: Effort normal and breath sounds normal. He has no wheezes. He has no rales.  Abdominal pain with deep breathing  Abdominal: Soft. Bowel sounds are normal. There is tenderness. There is guarding. There is no rebound.  Tender right upper quadrant with palpation.   Musculoskeletal: Normal range of  motion. He exhibits no edema.  Lymphadenopathy:    He has no cervical adenopathy.  Neurological: He is alert and oriented to person, place, and time.  Skin: Skin is warm and dry. No rash noted.  Psychiatric: He has a normal mood and affect. His behavior is normal. Judgment and thought content normal.  Nursing note and vitals reviewed.   BP 113/70 mmHg  Pulse 107  Temp(Src) 97.2 F (36.2 C) (Oral)  Ht 5\' 10"  (1.778 m)  Wt 164 lb (74.39 kg)  BMI 23.53  kg/m2       Assessment & Plan:  1. Pain of upper abdomen -Referred to hospitalist for admission - CBC with Differential/Platelet - Hepatic function panel - Amylase - Lipase  2. Nausea and vomiting, vomiting of unspecified type -Referred to hospitalist for admission - CBC with Differential/Platelet - Hepatic function panel - Amylase - Lipase  3. Abdominal pain, right upper quadrant -Referred to hospitalist for admission and consult with general surgery, spoke with Dr. Ashok Cordia PA earlier today and he is aware of this patient.  4. Type 2 diabetes mellitus without complication, without long-term current use of insulin ([place]) -Continue with current diabetic medicines  5. Hypertension -Blood pressure is good  Patient Instructions  We will speak to the hospitalist about your condition and hopefully get your admitted to the hospital today for further testing and possibly getting a consult with a surgeon to cause of the severity of your abdominal pain and nausea and tenderness in your abdomen   Arrie Senate MD

## 2015-06-19 NOTE — H&P (Addendum)
[facility] History and Physical  [name] [id number] DOB: 02/08/1946 DOA: 06/19/2015  Referring physician: Patient is a direct admit. PCP: Redge Gainer, MD  Specialists: Dr. Watt Climes. Gastroenterologist.  Chief Complaint: Abdominal pain.  HPI: Matthew Daugherty is a 70 y.o. male with history of hypertension, diabetes mellitus type 2, hyperlipidemia, hypothyroidism was admitted directly because of abdominal pain with CAT scan showing gallstones and possible pancreatitis. Patient states patient has been having abdominal pain for last few months but over the last few days it has worsened. Pain is mostly in the right upper quadrant at times increases with food. Has been having nausea and vomiting. CT abdomen and pelvis shows gallstones with possible pancreatitis and patient has been admitted directly. Denies any fever chills chest pain shortness of breath. Labs are pending.   Review of Systems: As presented in the history of presenting illness, rest negative.  Past Medical History  Diagnosis Date  . Other and unspecified hyperlipidemia   . Essential hypertension, benign   . Unspecified transient cerebral ischemia   . Cardiac dysrhythmia, unspecified   . Hyperplasia of prostate    History reviewed. No pertinent past surgical history. Social History:  reports that he quit smoking about 17 years ago. His smoking use included Cigarettes. He started smoking about 61 years ago. He smoked 1.00 pack per day. He does not have any smokeless tobacco history on file. He reports that he does not drink alcohol or use illicit drugs. Where does patient live Home. Can patient participate in ADLs? Yes.  Allergies  Allergen Reactions  . Niaspan [Niacin Er] Other (See Comments)    Increases blood sugars     Family History:  Family History  Problem Relation Age of Onset  . Cancer Mother     bone  . Other Father     MI  . Asthma Father       Prior to Admission medications   Medication Sig  Start Date End Date Taking? Authorizing Provider  aspirin 81 MG EC tablet Take 81 mg by mouth daily.     Yes Historical Provider, MD  atorvastatin (LIPITOR) 80 MG tablet Take 1 tablet (80 mg total) by mouth daily at 6 PM. 08/28/14  Yes Chipper Herb, MD  Cholecalciferol (VITAMIN D-3) 5000 UNITS TABS Take 1 tablet by mouth daily.     Yes Historical Provider, MD  ezetimibe (ZETIA) 10 MG tablet Take 1 tablet (10 mg total) by mouth daily. 06/12/15  Yes Chipper Herb, MD  fenofibrate 160 MG tablet Take 1 tablet (160 mg total) by mouth daily. 08/28/14  Yes Chipper Herb, MD  levothyroxine (SYNTHROID, LEVOTHROID) 75 MCG tablet Take 1 tablet (75 mcg total) by mouth daily before breakfast. 12/28/14  Yes Chipper Herb, MD  metFORMIN (GLUCOPHAGE) 1000 MG tablet Take 1 tablet (1,000 mg total) by mouth 2 (two) times daily with a meal. 09/25/14  Yes Chipper Herb, MD  Multiple Vitamin (MULTIVITAMIN WITH MINERALS) TABS tablet Take 1 tablet by mouth daily.   Yes Historical Provider, MD  valsartan-hydrochlorothiazide (DIOVAN-HCT) 320-25 MG tablet TAKE 1 TABLET EVERY DAY 05/27/15  Yes Chipper Herb, MD    Physical Exam: Filed Vitals:   06/19/15 2101  BP: 100/59  Pulse: 98  Temp: 98.2 F (36.8 C)  TempSrc: Oral  Resp: 16  SpO2: 99%     General:  Moderately built and nourished.  Eyes: Anicteric no pallor.  ENT: No discharge from the ears eyes nose or mouth.  Neck: No mass felt.  Cardiovascular: S1-S2 heard.  Respiratory: No rhonchi or crepitations.  Abdomen: Soft mild tenderness in the epigastric area and right upper quadrant. No guarding or rigidity.  Skin: No rash.  Musculoskeletal: No edema.  Psychiatric: Appears normal.  Neurologic: Alert awake oriented to time place and person. Moves all extremities.  Labs on Admission:  Basic Metabolic Panel: No results for input(s): NA, K, CL, CO2, GLUCOSE, BUN, CREATININE, CALCIUM, MG, PHOS in the last 168 hours. Liver Function Tests: No results  for input(s): AST, ALT, ALKPHOS, BILITOT, PROT, ALBUMIN in the last 168 hours. No results for input(s): LIPASE, AMYLASE in the last 168 hours. No results for input(s): AMMONIA in the last 168 hours. CBC: No results for input(s): WBC, NEUTROABS, HGB, HCT, MCV, PLT in the last 168 hours. Cardiac Enzymes: No results for input(s): CKTOTAL, CKMB, CKMBINDEX, TROPONINI in the last 168 hours.  BNP (last 3 results) No results for input(s): BNP in the last 8760 hours.  ProBNP (last 3 results) No results for input(s): PROBNP in the last 8760 hours.  CBG: No results for input(s): GLUCAP in the last 168 hours.  Radiological Exams on Admission: Ct Abdomen Pelvis Wo Contrast  06/19/2015  CLINICAL DATA:  Pain of the upper abdomen with nausea and vomiting. EXAM: CT ABDOMEN AND PELVIS WITHOUT CONTRAST TECHNIQUE: Multidetector CT imaging of the abdomen and pelvis was performed following the standard protocol without IV contrast. COMPARISON:  None. FINDINGS: Lower chest and abdominal wall:  Mild atelectasis at the bases. 5 mm right basilar pulmonary nodule. Extensive coronary atherosclerosis Hepatobiliary: No focal liver abnormality.Calculi seen in the gallbladder fundus. Pancreas: Full appearance of the pancreatic head with subtle surrounding fat stranding. The neighboring duodenal folds appear thickened. No discrete mass lesion is seen and there is no ductal dilatation, but postcontrast imaging will be recommended. Spleen: Unremarkable. Adrenals/Urinary Tract: Negative adrenals. No hydronephrosis. Hilar calcifications are arterial Unremarkable bladder. Reproductive:No pathologic findings. Stomach/Bowel: Duodenal fold thickening along the pancreatic head as described. No obstruction. Vascular/Lymphatic: No acute vascular abnormality. No mass or adenopathy. Peritoneal: No ascites or pneumoperitoneum. Musculoskeletal: No acute abnormalities. IMPRESSION: 1. Fullness of the pancreatic head and neighboring duodenal fold  thickening. Mild pancreatitis is favored but recommend enhanced CT or MRI. 2. Cholelithiasis. 3. 5 mm right pulmonary nodule. Given risk factors for bronchogenic carcinoma, follow-up chest CT at 6 - 12 months is recommended. This recommendation follows the consensus statement: Guidelines for Management of Small Pulmonary Nodules Detected on CT Scans: A Statement from the [employer] as published in Radiology 2005;237:395-400. Electronically Signed   By: Monte Fantasia M.D.   On: 06/19/2015 08:50     Assessment/Plan Principal Problem:   Gallstone pancreatitis Active Problems:   Hyperlipemia   Hypothyroid   Diabetes mellitus type 2, controlled ([place])   Essential hypertension   Pancreatitis   1. Gallstone pancreatitis - labs including metabolic panel and LFTs lipase are pending. For now I have place patient nothing by mouth and IV fluids. MRCP has been ordered. I will consult GI. Patient eventually will need cholecystectomy. Patient is on pain relief medications. Empiric antibiotics. 2. Hypertension - since patient is nothing by mouth I have place patient on when necessary IV hydralazine. 3. Diabetes mellitus type 2 - patient be on sliding scale coverage. 4. Hypothyroidism - IV Synthroid until patient can take orally. 5. Hyperlipidemia - medication can be taken only when patient starts orals.  Addendum - consulted  Gastroenerologist Dr.[place].  DVT Prophylaxis SCDs in anticipation of  procedure.  Code Status: Full code.  Family Communication: Discussed with wife at the bedside.  Disposition Plan: Admit to inpatient.    Canio Winokur N. [facility] Pager [phone]  If 7PM-7AM, please contact night-coverage [email or web address] Password [facility] 06/19/2015, 11:31 PM

## 2015-06-19 NOTE — Patient Instructions (Signed)
We will speak to the hospitalist about your condition and hopefully get your admitted to the hospital today for further testing and possibly getting a consult with a surgeon to cause of the severity of your abdominal pain and nausea and tenderness in your abdomen

## 2015-06-20 ENCOUNTER — Ambulatory Visit (HOSPITAL_COMMUNITY): Admission: RE | Admit: 2015-06-20 | Payer: Medicare Other | Source: Ambulatory Visit

## 2015-06-20 ENCOUNTER — Inpatient Hospital Stay (HOSPITAL_COMMUNITY): Payer: Medicare Other

## 2015-06-20 DIAGNOSIS — I10 Essential (primary) hypertension: Secondary | ICD-10-CM

## 2015-06-20 DIAGNOSIS — E039 Hypothyroidism, unspecified: Secondary | ICD-10-CM

## 2015-06-20 DIAGNOSIS — E785 Hyperlipidemia, unspecified: Secondary | ICD-10-CM

## 2015-06-20 DIAGNOSIS — K851 Biliary acute pancreatitis without necrosis or infection: Principal | ICD-10-CM

## 2015-06-20 LAB — CBC WITH DIFFERENTIAL/PLATELET
BASOS ABS: 0 10*3/uL (ref 0.0–0.1)
BASOS PCT: 0 %
Basophils Absolute: 0 10*3/uL (ref 0.0–0.2)
Basos: 0 %
EOS (ABSOLUTE): 0 10*3/uL (ref 0.0–0.4)
EOS: 0 %
Eosinophils Absolute: 0.1 10*3/uL (ref 0.0–0.7)
Eosinophils Relative: 1 %
HEMATOCRIT: 37.2 % — AB (ref 37.5–51.0)
HEMATOCRIT: 32.6 % — AB (ref 39.0–52.0)
HEMOGLOBIN: 12.4 g/dL — AB (ref 12.6–17.7)
HEMOGLOBIN: 11.2 g/dL — AB (ref 13.0–17.0)
Immature Grans (Abs): 0 10*3/uL (ref 0.0–0.1)
Immature Granulocytes: 0 %
LYMPHS ABS: 1.6 10*3/uL (ref 0.7–3.1)
LYMPHS PCT: 21 %
Lymphs Abs: 1.7 10*3/uL (ref 0.7–4.0)
Lymphs: 13 %
MCH: 29.7 pg (ref 26.6–33.0)
MCH: 29.5 pg (ref 26.0–34.0)
MCHC: 33.3 g/dL (ref 31.5–35.7)
MCHC: 34.4 g/dL (ref 30.0–36.0)
MCV: 89 fL (ref 79–97)
MCV: 85.8 fL (ref 78.0–100.0)
MONO ABS: 0.4 10*3/uL (ref 0.1–1.0)
MONOCYTES: 3 %
MONOS ABS: 0.3 10*3/uL (ref 0.1–0.9)
Monocytes Relative: 5 %
NEUTROS ABS: 9.9 10*3/uL — AB (ref 1.4–7.0)
NEUTROS ABS: 5.9 10*3/uL (ref 1.7–7.7)
NEUTROS PCT: 73 %
Neutrophils: 84 %
Platelets: 406 10*3/uL — ABNORMAL HIGH (ref 150–379)
Platelets: 346 10*3/uL (ref 150–400)
RBC: 4.18 x10E6/uL (ref 4.14–5.80)
RBC: 3.8 MIL/uL — AB (ref 4.22–5.81)
RDW: 14.3 % (ref 12.3–15.4)
RDW: 13.8 % (ref 11.5–15.5)
WBC: 11.8 10*3/uL — AB (ref 3.4–10.8)
WBC: 8.1 10*3/uL (ref 4.0–10.5)

## 2015-06-20 LAB — BASIC METABOLIC PANEL
ANION GAP: 7 (ref 5–15)
BUN: 14 mg/dL (ref 6–20)
CALCIUM: 8.8 mg/dL — AB (ref 8.9–10.3)
CO2: 26 mmol/L (ref 22–32)
Chloride: 101 mmol/L (ref 101–111)
Creatinine, Ser: 1.18 mg/dL (ref 0.61–1.24)
GFR calc Af Amer: >60 mL/min (ref >60)
Glucose, Bld: 154 mg/dL — ABNORMAL HIGH (ref 65–99)
POTASSIUM: 4 mmol/L (ref 3.5–5.1)
SODIUM: 134 mmol/L — AB (ref 135–145)

## 2015-06-20 LAB — LIPID PANEL
CHOL/HDL RATIO: 5.5 ratio
CHOLESTEROL: 83 mg/dL (ref 0–200)
HDL: 15 mg/dL — AB (ref >40)
LDL CALC: 31 mg/dL (ref 0–99)
TRIGLYCERIDES: 187 mg/dL — AB (ref <150)
VLDL: 37 mg/dL (ref 0–40)

## 2015-06-20 LAB — HEPATIC FUNCTION PANEL
ALK PHOS: 98 IU/L (ref 39–117)
ALT: 367 IU/L — AB (ref 0–44)
ALT: 259 U/L — ABNORMAL HIGH (ref 17–63)
AST: 254 IU/L — AB (ref 0–40)
AST: 148 U/L — ABNORMAL HIGH (ref 15–41)
Albumin: 4.1 g/dL (ref 3.6–4.8)
Albumin: 3.5 g/dL (ref 3.5–5.0)
Alkaline Phosphatase: 69 U/L (ref 38–126)
BILIRUBIN DIRECT: 0.6 mg/dL — AB (ref 0.1–0.5)
BILIRUBIN INDIRECT: 0.5 mg/dL (ref 0.3–0.9)
Bilirubin Total: 1.4 mg/dL — ABNORMAL HIGH (ref 0.0–1.2)
Bilirubin, Direct: 0.91 mg/dL — ABNORMAL HIGH (ref 0.00–0.40)
TOTAL PROTEIN: 6.2 g/dL — AB (ref 6.5–8.1)
Total Bilirubin: 1.1 mg/dL (ref 0.3–1.2)
Total Protein: 6.5 g/dL (ref 6.0–8.5)

## 2015-06-20 LAB — GLUCOSE, CAPILLARY
GLUCOSE-CAPILLARY: 118 mg/dL — AB (ref 65–99)
Glucose-Capillary: 140 mg/dL — ABNORMAL HIGH (ref 65–99)
Glucose-Capillary: 148 mg/dL — ABNORMAL HIGH (ref 65–99)
Glucose-Capillary: 150 mg/dL — ABNORMAL HIGH (ref 65–99)
Glucose-Capillary: 151 mg/dL — ABNORMAL HIGH (ref 65–99)
Glucose-Capillary: 131 mg/dL — ABNORMAL HIGH (ref 65–99)

## 2015-06-20 LAB — URINALYSIS, ROUTINE W REFLEX MICROSCOPIC
GLUCOSE, UA: NEGATIVE mg/dL
HGB URINE DIPSTICK: NEGATIVE
KETONES UR: NEGATIVE mg/dL
LEUKOCYTES UA: NEGATIVE
Nitrite: NEGATIVE
PH: 7 (ref 5.0–8.0)
PROTEIN: NEGATIVE mg/dL
Specific Gravity, Urine: 1.034 — ABNORMAL HIGH (ref 1.005–1.030)

## 2015-06-20 LAB — AMYLASE: AMYLASE: 617 U/L — AB (ref 31–124)

## 2015-06-20 LAB — LIPASE, BLOOD: LIPASE: 1618 U/L — AB (ref 11–51)

## 2015-06-20 LAB — LIPASE: LIPASE: 2298 U/L — AB (ref 0–59)

## 2015-06-20 MED ORDER — PIPERACILLIN-TAZOBACTAM 3.375 G IVPB
3.3750 g | INTRAVENOUS | Status: AC
  Administered 2015-06-20: 3.375 g via INTRAVENOUS
  Filled 2015-06-20: qty 50

## 2015-06-20 MED ORDER — GADOBENATE DIMEGLUMINE 529 MG/ML IV SOLN
15.0000 mL | Freq: Once | INTRAVENOUS | Status: AC | PRN
  Administered 2015-06-20: 15 mL via INTRAVENOUS

## 2015-06-20 MED ORDER — PIPERACILLIN-TAZOBACTAM 3.375 G IVPB
3.3750 g | Freq: Three times a day (TID) | INTRAVENOUS | Status: DC
  Administered 2015-06-20 – 2015-06-23 (×9): 3.375 g via INTRAVENOUS
  Filled 2015-06-20 (×10): qty 50

## 2015-06-20 NOTE — Progress Notes (Signed)
Pharmacy Antibiotic Note  Matthew Daugherty is a 70 y.o. male admitted on 06/19/2015 with intra-abdominal infection.  Pharmacy has been consulted for Zosyn dosing.  Plan: Zosyn 3.375g IV q8h (4 hour infusion).  Height: 5\' 10"  (177.8 cm) Weight: 164 lb (74.39 kg) IBW/kg (Calculated) : 73  Temp (24hrs), Avg:97.8 F (36.6 C), Min:97.2 F (36.2 C), Max:98.2 F (36.8 C)   Recent Labs Lab 06/19/15 1716 06/20/15 0416  WBC 11.8* 8.1  CREATININE  --  1.18    Estimated Creatinine Clearance: 62.7 mL/min (by C-G formula based on Cr of 1.18).    Allergies  Allergen Reactions  . Niaspan [Niacin Er] Other (See Comments)    Increases blood sugars     Antimicrobials this admission: 3/2 >> Zosyn >>  Dose adjustments this admission:   Microbiology results:  Thank you for allowing pharmacy to be a part of this patient's care.  Ralene Bathe, PharmD, BCPS 06/20/2015, 6:53 AM  Pager: [phone]

## 2015-06-20 NOTE — Progress Notes (Signed)
Pt arrived to floor accompanied by wife. Pt orientated to the room environment. Pt verbalized understanding of call bell and ascom phone use. Pt denies pain at this time. Dr. Nichola Sizer on floor to evaluate pt. New orders placed. Pt maintained NPO per M.D . Order. Monitoring continued per Doctor order and unit protocol.

## 2015-06-20 NOTE — Consult Note (Signed)
[facility] CONSULT Reason for consult: Gallstone Pancreatitis Referring Physician: [facility]. PCP: Dr. Laurance Flatten. Primary G.I.: Dr. Sharl Daugherty is an 70 y.o. male.  HPI:  he is followed routinely by Dr. Watt Climes with colonoscopies for polyps. His last colonoscopy 8/15 was non-revealing. He had had a history of previous adenomatous polyps. He had no knowledge of previous gallstones. He has had intermittent postprandial nausea and discomfort for several months that was episodic with symptom-free intervals. He had fairly severe pain that got progressively worse over the past several days came into the emergency room yesterday. His labs revealed lipase 1600 with essentially normal liver function test. CT scan of the abdomen showed been inflamed pancreas and MRCP was ordered which showed defects in the gallbladder consistent with gallstones with no dilated bile ducts no evidence of CBD stones. The pancreas head was somewhat enlarged but the parenchyma appeared normal without Frank pancreatic mass. Mild fatty liver was seen but no Frank hepatic masses. Patient clinically feels better than he did when he came in.  Past Medical History  Diagnosis Date  . Other and unspecified hyperlipidemia   . Essential hypertension, benign   . Unspecified transient cerebral ischemia   . Cardiac dysrhythmia, unspecified   . Hyperplasia of prostate     History reviewed. No pertinent past surgical history.  Family History  Problem Relation Age of Onset  . Cancer Mother     bone  . Other Father     MI  . Asthma Father     Social History:  reports that he quit smoking about 17 years ago. His smoking use included Cigarettes. He started smoking about 61 years ago. He smoked 1.00 pack per day. He does not have any smokeless tobacco history on file. He reports that he does not drink alcohol or use illicit drugs.  Allergies:  Allergies  Allergen Reactions  . Niaspan [Niacin Er] Other (See  Comments)    Increases blood sugars     Medications; Prior to Admission medications   Medication Sig Start Date End Date Taking? Authorizing Provider  aspirin 81 MG EC tablet Take 81 mg by mouth daily.     Yes Historical Provider, MD  atorvastatin (LIPITOR) 80 MG tablet Take 1 tablet (80 mg total) by mouth daily at 6 PM. 08/28/14  Yes Chipper Herb, MD  Cholecalciferol (VITAMIN D-3) 5000 UNITS TABS Take 1 tablet by mouth daily.     Yes Historical Provider, MD  ezetimibe (ZETIA) 10 MG tablet Take 1 tablet (10 mg total) by mouth daily. 06/12/15  Yes Chipper Herb, MD  fenofibrate 160 MG tablet Take 1 tablet (160 mg total) by mouth daily. 08/28/14  Yes Chipper Herb, MD  levothyroxine (SYNTHROID, LEVOTHROID) 75 MCG tablet Take 1 tablet (75 mcg total) by mouth daily before breakfast. 12/28/14  Yes Chipper Herb, MD  metFORMIN (GLUCOPHAGE) 1000 MG tablet Take 1 tablet (1,000 mg total) by mouth 2 (two) times daily with a meal. 09/25/14  Yes Chipper Herb, MD  Multiple Vitamin (MULTIVITAMIN WITH MINERALS) TABS tablet Take 1 tablet by mouth daily.   Yes Historical Provider, MD  valsartan-hydrochlorothiazide (DIOVAN-HCT) 320-25 MG tablet TAKE 1 TABLET EVERY DAY 05/27/15  Yes Chipper Herb, MD   . insulin aspart  0-9 Units Subcutaneous Q4H  . levothyroxine  37.5 mcg Intravenous Daily  . piperacillin-tazobactam (ZOSYN)  IV  3.375 g Intravenous 3 times per day   PRN Meds acetaminophen **OR** acetaminophen, hydrALAZINE, morphine injection,  ondansetron **OR** ondansetron (ZOFRAN) IV Results for orders placed or performed during the hospital encounter of 06/19/15 (from the past 48 hour(s))  Glucose, capillary     Status: Abnormal   Collection Time: 06/19/15 11:49 PM  Result Value Ref Range   Glucose-Capillary 148 (H) 65 - 99 mg/dL  Glucose, capillary     Status: Abnormal   Collection Time: 06/20/15  3:31 AM  Result Value Ref Range   Glucose-Capillary 140 (H) 65 - 99 mg/dL  Hepatic function panel      Status: Abnormal   Collection Time: 06/20/15  4:16 AM  Result Value Ref Range   Total Protein 6.2 (L) 6.5 - 8.1 g/dL   Albumin 3.5 3.5 - 5.0 g/dL   AST 148 (H) 15 - 41 U/L   ALT 259 (H) 17 - 63 U/L   Alkaline Phosphatase 69 38 - 126 U/L   Total Bilirubin 1.1 0.3 - 1.2 mg/dL   Bilirubin, Direct 0.6 (H) 0.1 - 0.5 mg/dL   Indirect Bilirubin 0.5 0.3 - 0.9 mg/dL  CBC with Differential/Platelet     Status: Abnormal   Collection Time: 06/20/15  4:16 AM  Result Value Ref Range   WBC 8.1 4.0 - 10.5 K/uL   RBC 3.80 (L) 4.22 - 5.81 MIL/uL   Hemoglobin 11.2 (L) 13.0 - 17.0 g/dL   HCT 32.6 (L) 39.0 - 52.0 %   MCV 85.8 78.0 - 100.0 fL   MCH 29.5 26.0 - 34.0 pg   MCHC 34.4 30.0 - 36.0 g/dL   RDW 13.8 11.5 - 15.5 %   Platelets 346 150 - 400 K/uL   Neutrophils Relative % 73 %   Neutro Abs 5.9 1.7 - 7.7 K/uL   Lymphocytes Relative 21 %   Lymphs Abs 1.7 0.7 - 4.0 K/uL   Monocytes Relative 5 %   Monocytes Absolute 0.4 0.1 - 1.0 K/uL   Eosinophils Relative 1 %   Eosinophils Absolute 0.1 0.0 - 0.7 K/uL   Basophils Relative 0 %   Basophils Absolute 0.0 0.0 - 0.1 K/uL  Basic metabolic panel     Status: Abnormal   Collection Time: 06/20/15  4:16 AM  Result Value Ref Range   Sodium 134 (L) 135 - 145 mmol/L   Potassium 4.0 3.5 - 5.1 mmol/L   Chloride 101 101 - 111 mmol/L   CO2 26 22 - 32 mmol/L   Glucose, Bld 154 (H) 65 - 99 mg/dL   BUN 14 6 - 20 mg/dL   Creatinine, Ser 1.18 0.61 - 1.24 mg/dL   Calcium 8.8 (L) 8.9 - 10.3 mg/dL   GFR calc non Af Amer >60 >60 mL/min   GFR calc Af Amer >60 >60 mL/min    Comment: (NOTE) The eGFR has been calculated using the CKD EPI equation. This calculation has not been validated in all clinical situations. eGFR's persistently <60 mL/min signify possible Chronic Kidney Disease.    Anion gap 7 5 - 15  Lipase, blood     Status: Abnormal   Collection Time: 06/20/15  4:16 AM  Result Value Ref Range   Lipase 1618 (H) 11 - 51 U/L    Comment: RESULTS CONFIRMED  BY MANUAL DILUTION  Lipid panel     Status: Abnormal   Collection Time: 06/20/15  4:16 AM  Result Value Ref Range   Cholesterol 83 0 - 200 mg/dL   Triglycerides 187 (H) <150 mg/dL   HDL 15 (L) >40 mg/dL   Total CHOL/HDL Ratio 5.5 RATIO  VLDL 37 0 - 40 mg/dL   LDL Cholesterol 31 0 - 99 mg/dL    Comment:        Total Cholesterol/HDL:CHD Risk Coronary Heart Disease Risk Table                     Men   Women  1/2 Average Risk   3.4   3.3  Average Risk       5.0   4.4  2 X Average Risk   9.6   7.1  3 X Average Risk  23.4   11.0        Use the calculated Patient Ratio above and the CHD Risk Table to determine the patient's CHD Risk.        ATP III CLASSIFICATION (LDL):  <100     mg/dL   Optimal  100-129  mg/dL   Near or Above                    Optimal  130-159  mg/dL   Borderline  160-189  mg/dL   High  >190     mg/dL   Very High Performed at [facility]   Glucose, capillary     Status: Abnormal   Collection Time: 06/20/15  7:44 AM  Result Value Ref Range   Glucose-Capillary 151 (H) 65 - 99 mg/dL  Glucose, capillary     Status: Abnormal   Collection Time: 06/20/15  8:53 AM  Result Value Ref Range   Glucose-Capillary 150 (H) 65 - 99 mg/dL    Ct Abdomen Pelvis Wo Contrast  06/19/2015  CLINICAL DATA:  Pain of the upper abdomen with nausea and vomiting. EXAM: CT ABDOMEN AND PELVIS WITHOUT CONTRAST TECHNIQUE: Multidetector CT imaging of the abdomen and pelvis was performed following the standard protocol without IV contrast. COMPARISON:  None. FINDINGS: Lower chest and abdominal wall:  Mild atelectasis at the bases. 5 mm right basilar pulmonary nodule. Extensive coronary atherosclerosis Hepatobiliary: No focal liver abnormality.Calculi seen in the gallbladder fundus. Pancreas: Full appearance of the pancreatic head with subtle surrounding fat stranding. The neighboring duodenal folds appear thickened. No discrete mass lesion is seen and there is no ductal dilatation, but  postcontrast imaging will be recommended. Spleen: Unremarkable. Adrenals/Urinary Tract: Negative adrenals. No hydronephrosis. Hilar calcifications are arterial Unremarkable bladder. Reproductive:No pathologic findings. Stomach/Bowel: Duodenal fold thickening along the pancreatic head as described. No obstruction. Vascular/Lymphatic: No acute vascular abnormality. No mass or adenopathy. Peritoneal: No ascites or pneumoperitoneum. Musculoskeletal: No acute abnormalities. IMPRESSION: 1. Fullness of the pancreatic head and neighboring duodenal fold thickening. Mild pancreatitis is favored but recommend enhanced CT or MRI. 2. Cholelithiasis. 3. 5 mm right pulmonary nodule. Given risk factors for bronchogenic carcinoma, follow-up chest CT at 6 - 12 months is recommended. This recommendation follows the consensus statement: Guidelines for Management of Small Pulmonary Nodules Detected on CT Scans: A Statement from the [employer] as published in Radiology 2005;237:395-400. Electronically Signed   By: Monte Fantasia M.D.   On: 06/19/2015 08:50   Mr 3d Recon At Scanner  06/20/2015  CLINICAL DATA:  70 year old male with fullness noted in the pancreatic head and surrounding fat stranding on recent CT examination, concerning for potential pancreatitis. Followup study. EXAM: MRI ABDOMEN WITHOUT AND WITH CONTRAST (INCLUDING MRCP) TECHNIQUE: Multiplanar multisequence MR imaging of the abdomen was performed both before and after the administration of intravenous contrast. Heavily T2-weighted images of the biliary and pancreatic ducts were obtained, and three-dimensional  MRCP images were rendered by post processing. CONTRAST:  51m MULTIHANCE GADOBENATE DIMEGLUMINE 529 MG/ML IV SOLN COMPARISON:  No prior abdominal MRI. CT the abdomen and pelvis 06/19/2015. FINDINGS: Lower chest:  Unremarkable. Hepatobiliary: There are mild diffuse loss of signal intensity throughout the hepatic parenchyma on out of phase dual echo images,  compatible with a background of very mild hepatic steatosis. Sub cm lesion in the periphery of segment 4A of the liver is T1 hypointense, T2 hyperintense, and does not enhance, compatible with a tiny cyst or biliary hamartoma. No other larger more suspicious hepatic lesions are noted. MRCP images demonstrate no intra or extrahepatic biliary ductal dilatation. Common bile duct measures up to 5 mm in the porta hepatis. There are some tiny filling defects lying dependently in the gallbladder, compatible with tiny gallstones. Gallbladder is not distended. Gallbladder wall thickness is normal. No pericholecystic fluid. No filling defects in the common bile duct to suggest choledocholithiasis. Pancreas: No pancreatic mass. The area of perceived fullness in the pancreatic head on the recent CT examination appears to represent normal pancreatic parenchyma. No pancreatic ductal dilatation on MRCP images. No pancreatic or peripancreatic fluid or inflammatory changes. Pancreatic parenchyma is enhancing normally at this time. Spleen: Unremarkable. Adrenals/Urinary Tract: Bilateral kidneys and bilateral adrenal glands are normal in appearance. No hydroureteronephrosis in the visualized abdomen. Stomach/Bowel: Visualized portions are unremarkable. Specifically, duodenum is grossly normal in appearance on today's examination. Vascular/Lymphatic: Atherosclerotic disease throughout the visualized abdominal vasculature, without aneurysm. No lymphadenopathy noted in the abdomen. Other: No significant volume of ascites in the visualized peritoneal cavity. Musculoskeletal: No aggressive osseous lesions are noted in the visualized portions of the skeleton. IMPRESSION: 1. No pancreatic mass identified. 2. No overt imaging findings of pancreatitis at this time (despite the patient's elevated lipase levels). Importantly, the pancreatic parenchyma also enhances normally at this time. 3. Cholelithiasis without evidence of acute cholecystitis  at this time. 4. No choledocholithiasis or findings to suggest biliary tract obstruction. 5. Mild hepatic steatosis. Electronically Signed   By: DVinnie LangtonM.D.   On: 06/20/2015 08:57   Mr AJeananne RamaW/wo Cm/mrcp  06/20/2015  CLINICAL DATA:  70year old male with fullness noted in the pancreatic head and surrounding fat stranding on recent CT examination, concerning for potential pancreatitis. Followup study. EXAM: MRI ABDOMEN WITHOUT AND WITH CONTRAST (INCLUDING MRCP) TECHNIQUE: Multiplanar multisequence MR imaging of the abdomen was performed both before and after the administration of intravenous contrast. Heavily T2-weighted images of the biliary and pancreatic ducts were obtained, and three-dimensional MRCP images were rendered by post processing. CONTRAST:  119mMULTIHANCE GADOBENATE DIMEGLUMINE 529 MG/ML IV SOLN COMPARISON:  No prior abdominal MRI. CT the abdomen and pelvis 06/19/2015. FINDINGS: Lower chest:  Unremarkable. Hepatobiliary: There are mild diffuse loss of signal intensity throughout the hepatic parenchyma on out of phase dual echo images, compatible with a background of very mild hepatic steatosis. Sub cm lesion in the periphery of segment 4A of the liver is T1 hypointense, T2 hyperintense, and does not enhance, compatible with a tiny cyst or biliary hamartoma. No other larger more suspicious hepatic lesions are noted. MRCP images demonstrate no intra or extrahepatic biliary ductal dilatation. Common bile duct measures up to 5 mm in the porta hepatis. There are some tiny filling defects lying dependently in the gallbladder, compatible with tiny gallstones. Gallbladder is not distended. Gallbladder wall thickness is normal. No pericholecystic fluid. No filling defects in the common bile duct to suggest choledocholithiasis. Pancreas: No pancreatic mass. The area of  perceived fullness in the pancreatic head on the recent CT examination appears to represent normal pancreatic parenchyma. No  pancreatic ductal dilatation on MRCP images. No pancreatic or peripancreatic fluid or inflammatory changes. Pancreatic parenchyma is enhancing normally at this time. Spleen: Unremarkable. Adrenals/Urinary Tract: Bilateral kidneys and bilateral adrenal glands are normal in appearance. No hydroureteronephrosis in the visualized abdomen. Stomach/Bowel: Visualized portions are unremarkable. Specifically, duodenum is grossly normal in appearance on today's examination. Vascular/Lymphatic: Atherosclerotic disease throughout the visualized abdominal vasculature, without aneurysm. No lymphadenopathy noted in the abdomen. Other: No significant volume of ascites in the visualized peritoneal cavity. Musculoskeletal: No aggressive osseous lesions are noted in the visualized portions of the skeleton. IMPRESSION: 1. No pancreatic mass identified. 2. No overt imaging findings of pancreatitis at this time (despite the patient's elevated lipase levels). Importantly, the pancreatic parenchyma also enhances normally at this time. 3. Cholelithiasis without evidence of acute cholecystitis at this time. 4. No choledocholithiasis or findings to suggest biliary tract obstruction. 5. Mild hepatic steatosis. Electronically Signed   By: Vinnie Langton M.D.   On: 06/20/2015 08:57               Blood pressure 128/71, pulse 91, temperature 97.5 F (36.4 C), temperature source Oral, resp. rate 16, height '5\' 10"'  (1.778 m), weight 74.39 kg (164 lb), SpO2 97 %.  Physical exam:   General-- alert white male no acute distress ENT-- nonicteric Neck-- no lymphadenopathy Heart-- regular rate and rhythm without murmurs gallops Lungs-- clear Abdomen-- nondistended with mild upper tenderness. Abdomen fairly quiet with minimal bowel sounds. Psych-- alert and oriented answers questions appropriately   Assessment: 1. Gallstone pancreatitis. Patient clinically feels better. Imaging does not show any evidence of choledocholithiasis.  Hopefully will continue to improve with pancreatic rest. No need for ERCP at this time  Plan: 1. With continuing NPO for now and follow labs. 2. Would obtain surgical consultation to arrange cholecystectomy. This could probably be delayed for a while to allow adequate time for his pancreas to recover.   Serine Kea JR,Antonious Omahoney L 06/20/2015, 1:24 PM   This note was created using voice recognition software and minor errors may Have occurred unintentionally. Pager: [phone] If no answer or after hours call [phone]

## 2015-06-20 NOTE — Consult Note (Signed)
[facility] Surgery Consult Note  Matthew Daugherty March 10, 1946  [id number].    Requesting MD: Dr. Hartford Poli Chief Complaint/Reason for Consult: Gallstone pancreatitis  HPI:  69 y/o white male with history of HTN, DM T2, HLD, hypothyroidism was admitted directly from Dr. Tawanna Sat office because of worsening epigastric and RUQ abdominal pain with CT scan (06/19/15) showing gallstones and possible pancreatitis.  Patient states patient has been having several episodic intermittent abdominal pain since December, but had asymptomatic periods.   Since February the pain has been more severe and more frequent.  He actually recalls several episodes over the years since July  2014, but these episodes were brief, less severe, and only occurred every 6 months or so.  Pain associated with eating greasy/fried foods.  Also admits to N/V and fever/chills during the episodes. Denies any CP/SOB, changes in bowel/bladder habits, yellowing of the skin/eyes.  He currently has 0/10 pain.  No other precipitating/alleviating factors.  No radicular pain.    CT abdomen and pelvis shows gallstones with possible pancreatitis.  He had no knowledge of previous stones.  Lipase currently 1618, AST 148, ALT, 259, bili normal at 1.1, WBC normal at 8.1.   ROS: All systems reviewed and otherwise negative except for as above  Family History  Problem Relation Age of Onset  . Cancer Mother     bone  . Other Father     MI  . Asthma Father     Past Medical History  Diagnosis Date  . Other and unspecified hyperlipidemia   . Essential hypertension, benign   . Unspecified transient cerebral ischemia   . Cardiac dysrhythmia, unspecified   . Hyperplasia of prostate     History reviewed. No pertinent past surgical history.  Social History:  reports that he quit smoking about 17 years ago. His smoking use included Cigarettes. He started smoking about 61 years ago. He smoked 1.00 pack per day. He does not have any smokeless tobacco  history on file. He reports that he does not drink alcohol or use illicit drugs.  Allergies:  Allergies  Allergen Reactions  . Niaspan [Niacin Er] Other (See Comments)    Increases blood sugars     Medications Prior to Admission  Medication Sig Dispense Refill  . aspirin 81 MG EC tablet Take 81 mg by mouth daily.      Marland Kitchen atorvastatin (LIPITOR) 80 MG tablet Take 1 tablet (80 mg total) by mouth daily at 6 PM. 90 tablet 3  . Cholecalciferol (VITAMIN D-3) 5000 UNITS TABS Take 1 tablet by mouth daily.      Marland Kitchen ezetimibe (ZETIA) 10 MG tablet Take 1 tablet (10 mg total) by mouth daily. 90 tablet 3  . fenofibrate 160 MG tablet Take 1 tablet (160 mg total) by mouth daily. 90 tablet 3  . levothyroxine (SYNTHROID, LEVOTHROID) 75 MCG tablet Take 1 tablet (75 mcg total) by mouth daily before breakfast. 90 tablet 3  . metFORMIN (GLUCOPHAGE) 1000 MG tablet Take 1 tablet (1,000 mg total) by mouth 2 (two) times daily with a meal. 60 tablet 0  . Multiple Vitamin (MULTIVITAMIN WITH MINERALS) TABS tablet Take 1 tablet by mouth daily.    . valsartan-hydrochlorothiazide (DIOVAN-HCT) 320-25 MG tablet TAKE 1 TABLET EVERY DAY 90 tablet 0    Blood pressure 110/72, pulse 88, temperature 97.5 F (36.4 C), temperature source Oral, resp. rate 16, height '5\' 10"'  (1.778 m), weight 74.39 kg (164 lb), SpO2 97 %. Physical Exam: General: pleasant, WD/WN white male who is  laying in bed in NAD HEENT: head is normocephalic, atraumatic.  Sclera are noninjected.  PERRL.  Ears and nose without any masses or lesions.  Mouth is pink and moist Heart: regular, rate, and rhythm.  No obvious murmurs, gallops, or rubs noted.  Palpable pedal pulses bilaterally Lungs: CTAB, no wheezes, rhonchi, or rales noted.  Respiratory effort nonlabored Abd: soft, NT/ND, +BS, no masses, hernias, or organomegaly, old well healed 4cm open appy scar in RLQ MS: all 4 extremities are symmetrical with no cyanosis, clubbing, or edema. Skin: warm and dry with  no masses, lesions, or rashes Psych: A&Ox3 with an appropriate affect.   Results for orders placed or performed during the hospital encounter of 06/19/15 (from the past 48 hour(s))  Glucose, capillary     Status: Abnormal   Collection Time: 06/19/15 11:49 PM  Result Value Ref Range   Glucose-Capillary 148 (H) 65 - 99 mg/dL  Glucose, capillary     Status: Abnormal   Collection Time: 06/20/15  3:31 AM  Result Value Ref Range   Glucose-Capillary 140 (H) 65 - 99 mg/dL  Hepatic function panel     Status: Abnormal   Collection Time: 06/20/15  4:16 AM  Result Value Ref Range   Total Protein 6.2 (L) 6.5 - 8.1 g/dL   Albumin 3.5 3.5 - 5.0 g/dL   AST 148 (H) 15 - 41 U/L   ALT 259 (H) 17 - 63 U/L   Alkaline Phosphatase 69 38 - 126 U/L   Total Bilirubin 1.1 0.3 - 1.2 mg/dL   Bilirubin, Direct 0.6 (H) 0.1 - 0.5 mg/dL   Indirect Bilirubin 0.5 0.3 - 0.9 mg/dL  CBC with Differential/Platelet     Status: Abnormal   Collection Time: 06/20/15  4:16 AM  Result Value Ref Range   WBC 8.1 4.0 - 10.5 K/uL   RBC 3.80 (L) 4.22 - 5.81 MIL/uL   Hemoglobin 11.2 (L) 13.0 - 17.0 g/dL   HCT 32.6 (L) 39.0 - 52.0 %   MCV 85.8 78.0 - 100.0 fL   MCH 29.5 26.0 - 34.0 pg   MCHC 34.4 30.0 - 36.0 g/dL   RDW 13.8 11.5 - 15.5 %   Platelets 346 150 - 400 K/uL   Neutrophils Relative % 73 %   Neutro Abs 5.9 1.7 - 7.7 K/uL   Lymphocytes Relative 21 %   Lymphs Abs 1.7 0.7 - 4.0 K/uL   Monocytes Relative 5 %   Monocytes Absolute 0.4 0.1 - 1.0 K/uL   Eosinophils Relative 1 %   Eosinophils Absolute 0.1 0.0 - 0.7 K/uL   Basophils Relative 0 %   Basophils Absolute 0.0 0.0 - 0.1 K/uL  Basic metabolic panel     Status: Abnormal   Collection Time: 06/20/15  4:16 AM  Result Value Ref Range   Sodium 134 (L) 135 - 145 mmol/L   Potassium 4.0 3.5 - 5.1 mmol/L   Chloride 101 101 - 111 mmol/L   CO2 26 22 - 32 mmol/L   Glucose, Bld 154 (H) 65 - 99 mg/dL   BUN 14 6 - 20 mg/dL   Creatinine, Ser 1.18 0.61 - 1.24 mg/dL    Calcium 8.8 (L) 8.9 - 10.3 mg/dL   GFR calc non Af Amer >60 >60 mL/min   GFR calc Af Amer >60 >60 mL/min    Comment: (NOTE) The eGFR has been calculated using the CKD EPI equation. This calculation has not been validated in all clinical situations. eGFR's persistently <60 mL/min signify  possible Chronic Kidney Disease.    Anion gap 7 5 - 15  Lipase, blood     Status: Abnormal   Collection Time: 06/20/15  4:16 AM  Result Value Ref Range   Lipase 1618 (H) 11 - 51 U/L    Comment: RESULTS CONFIRMED BY MANUAL DILUTION  Lipid panel     Status: Abnormal   Collection Time: 06/20/15  4:16 AM  Result Value Ref Range   Cholesterol 83 0 - 200 mg/dL   Triglycerides 187 (H) <150 mg/dL   HDL 15 (L) >40 mg/dL   Total CHOL/HDL Ratio 5.5 RATIO   VLDL 37 0 - 40 mg/dL   LDL Cholesterol 31 0 - 99 mg/dL    Comment:        Total Cholesterol/HDL:CHD Risk Coronary Heart Disease Risk Table                     Men   Women  1/2 Average Risk   3.4   3.3  Average Risk       5.0   4.4  2 X Average Risk   9.6   7.1  3 X Average Risk  23.4   11.0        Use the calculated Patient Ratio above and the CHD Risk Table to determine the patient's CHD Risk.        ATP III CLASSIFICATION (LDL):  <100     mg/dL   Optimal  100-129  mg/dL   Near or Above                    Optimal  130-159  mg/dL   Borderline  160-189  mg/dL   High  >190     mg/dL   Very High Performed at [facility]   Glucose, capillary     Status: Abnormal   Collection Time: 06/20/15  7:44 AM  Result Value Ref Range   Glucose-Capillary 151 (H) 65 - 99 mg/dL  Glucose, capillary     Status: Abnormal   Collection Time: 06/20/15  8:53 AM  Result Value Ref Range   Glucose-Capillary 150 (H) 65 - 99 mg/dL   Ct Abdomen Pelvis Wo Contrast  06/19/2015  CLINICAL DATA:  Pain of the upper abdomen with nausea and vomiting. EXAM: CT ABDOMEN AND PELVIS WITHOUT CONTRAST TECHNIQUE: Multidetector CT imaging of the abdomen and pelvis was performed  following the standard protocol without IV contrast. COMPARISON:  None. FINDINGS: Lower chest and abdominal wall:  Mild atelectasis at the bases. 5 mm right basilar pulmonary nodule. Extensive coronary atherosclerosis Hepatobiliary: No focal liver abnormality.Calculi seen in the gallbladder fundus. Pancreas: Full appearance of the pancreatic head with subtle surrounding fat stranding. The neighboring duodenal folds appear thickened. No discrete mass lesion is seen and there is no ductal dilatation, but postcontrast imaging will be recommended. Spleen: Unremarkable. Adrenals/Urinary Tract: Negative adrenals. No hydronephrosis. Hilar calcifications are arterial Unremarkable bladder. Reproductive:No pathologic findings. Stomach/Bowel: Duodenal fold thickening along the pancreatic head as described. No obstruction. Vascular/Lymphatic: No acute vascular abnormality. No mass or adenopathy. Peritoneal: No ascites or pneumoperitoneum. Musculoskeletal: No acute abnormalities. IMPRESSION: 1. Fullness of the pancreatic head and neighboring duodenal fold thickening. Mild pancreatitis is favored but recommend enhanced CT or MRI. 2. Cholelithiasis. 3. 5 mm right pulmonary nodule. Given risk factors for bronchogenic carcinoma, follow-up chest CT at 6 - 12 months is recommended. This recommendation follows the consensus statement: Guidelines for Management of Small Pulmonary Nodules Detected  on CT Scans: A Statement from the [employer] as published in Radiology 2005;237:395-400. Electronically Signed   By: Monte Fantasia M.D.   On: 06/19/2015 08:50   Mr 3d Recon At Scanner  06/20/2015  CLINICAL DATA:  70 year old male with fullness noted in the pancreatic head and surrounding fat stranding on recent CT examination, concerning for potential pancreatitis. Followup study. EXAM: MRI ABDOMEN WITHOUT AND WITH CONTRAST (INCLUDING MRCP) TECHNIQUE: Multiplanar multisequence MR imaging of the abdomen was performed both before and  after the administration of intravenous contrast. Heavily T2-weighted images of the biliary and pancreatic ducts were obtained, and three-dimensional MRCP images were rendered by post processing. CONTRAST:  88m MULTIHANCE GADOBENATE DIMEGLUMINE 529 MG/ML IV SOLN COMPARISON:  No prior abdominal MRI. CT the abdomen and pelvis 06/19/2015. FINDINGS: Lower chest:  Unremarkable. Hepatobiliary: There are mild diffuse loss of signal intensity throughout the hepatic parenchyma on out of phase dual echo images, compatible with a background of very mild hepatic steatosis. Sub cm lesion in the periphery of segment 4A of the liver is T1 hypointense, T2 hyperintense, and does not enhance, compatible with a tiny cyst or biliary hamartoma. No other larger more suspicious hepatic lesions are noted. MRCP images demonstrate no intra or extrahepatic biliary ductal dilatation. Common bile duct measures up to 5 mm in the porta hepatis. There are some tiny filling defects lying dependently in the gallbladder, compatible with tiny gallstones. Gallbladder is not distended. Gallbladder wall thickness is normal. No pericholecystic fluid. No filling defects in the common bile duct to suggest choledocholithiasis. Pancreas: No pancreatic mass. The area of perceived fullness in the pancreatic head on the recent CT examination appears to represent normal pancreatic parenchyma. No pancreatic ductal dilatation on MRCP images. No pancreatic or peripancreatic fluid or inflammatory changes. Pancreatic parenchyma is enhancing normally at this time. Spleen: Unremarkable. Adrenals/Urinary Tract: Bilateral kidneys and bilateral adrenal glands are normal in appearance. No hydroureteronephrosis in the visualized abdomen. Stomach/Bowel: Visualized portions are unremarkable. Specifically, duodenum is grossly normal in appearance on today's examination. Vascular/Lymphatic: Atherosclerotic disease throughout the visualized abdominal vasculature, without  aneurysm. No lymphadenopathy noted in the abdomen. Other: No significant volume of ascites in the visualized peritoneal cavity. Musculoskeletal: No aggressive osseous lesions are noted in the visualized portions of the skeleton. IMPRESSION: 1. No pancreatic mass identified. 2. No overt imaging findings of pancreatitis at this time (despite the patient's elevated lipase levels). Importantly, the pancreatic parenchyma also enhances normally at this time. 3. Cholelithiasis without evidence of acute cholecystitis at this time. 4. No choledocholithiasis or findings to suggest biliary tract obstruction. 5. Mild hepatic steatosis. Electronically Signed   By: DVinnie LangtonM.D.   On: 06/20/2015 08:57   Mr AJeananne RamaW/wo Cm/mrcp  06/20/2015  CLINICAL DATA:  70year old male with fullness noted in the pancreatic head and surrounding fat stranding on recent CT examination, concerning for potential pancreatitis. Followup study. EXAM: MRI ABDOMEN WITHOUT AND WITH CONTRAST (INCLUDING MRCP) TECHNIQUE: Multiplanar multisequence MR imaging of the abdomen was performed both before and after the administration of intravenous contrast. Heavily T2-weighted images of the biliary and pancreatic ducts were obtained, and three-dimensional MRCP images were rendered by post processing. CONTRAST:  17mMULTIHANCE GADOBENATE DIMEGLUMINE 529 MG/ML IV SOLN COMPARISON:  No prior abdominal MRI. CT the abdomen and pelvis 06/19/2015. FINDINGS: Lower chest:  Unremarkable. Hepatobiliary: There are mild diffuse loss of signal intensity throughout the hepatic parenchyma on out of phase dual echo images, compatible with a background of very mild hepatic steatosis.  Sub cm lesion in the periphery of segment 4A of the liver is T1 hypointense, T2 hyperintense, and does not enhance, compatible with a tiny cyst or biliary hamartoma. No other larger more suspicious hepatic lesions are noted. MRCP images demonstrate no intra or extrahepatic biliary ductal  dilatation. Common bile duct measures up to 5 mm in the porta hepatis. There are some tiny filling defects lying dependently in the gallbladder, compatible with tiny gallstones. Gallbladder is not distended. Gallbladder wall thickness is normal. No pericholecystic fluid. No filling defects in the common bile duct to suggest choledocholithiasis. Pancreas: No pancreatic mass. The area of perceived fullness in the pancreatic head on the recent CT examination appears to represent normal pancreatic parenchyma. No pancreatic ductal dilatation on MRCP images. No pancreatic or peripancreatic fluid or inflammatory changes. Pancreatic parenchyma is enhancing normally at this time. Spleen: Unremarkable. Adrenals/Urinary Tract: Bilateral kidneys and bilateral adrenal glands are normal in appearance. No hydroureteronephrosis in the visualized abdomen. Stomach/Bowel: Visualized portions are unremarkable. Specifically, duodenum is grossly normal in appearance on today's examination. Vascular/Lymphatic: Atherosclerotic disease throughout the visualized abdominal vasculature, without aneurysm. No lymphadenopathy noted in the abdomen. Other: No significant volume of ascites in the visualized peritoneal cavity. Musculoskeletal: No aggressive osseous lesions are noted in the visualized portions of the skeleton. IMPRESSION: 1. No pancreatic mass identified. 2. No overt imaging findings of pancreatitis at this time (despite the patient's elevated lipase levels). Importantly, the pancreatic parenchyma also enhances normally at this time. 3. Cholelithiasis without evidence of acute cholecystitis at this time. 4. No choledocholithiasis or findings to suggest biliary tract obstruction. 5. Mild hepatic steatosis. Electronically Signed   By: Vinnie Langton M.D.   On: 06/20/2015 08:57      Assessment/Plan Gallstone pancreatitis Symptomatic Cholelithiasis -GI had recommended MRI which was done and ruled out CBD stones -Medical  management while we allow pancreatitis to resolve:  NPO, bowel rest, IVF, pain control, antiemetics -SCD's and hold blood thinners for possible surgery tomorrow if labs are improved -Ambulate and IS -Will plan for lap chole with IOC once lipase and pain have improved -Recheck labs ordered for am.   Nat Christen, [facility] 06/20/2015, 3:33 PM Pager: [phone] (7am - 4:30pm M-F; 7am - 11:30am Sa/Su)

## 2015-06-20 NOTE — Progress Notes (Signed)
[facility] PROGRESS NOTE   Matthew Daugherty [id number] DOB: 1945-06-08 DOA: 06/19/2015 PCP: Redge Gainer, MD  HPI/Subjective: Seen with wife at bedside, denies any new complaints. Pain is 5-6/10 epigastric  Assessment/Plan: Principal Problem:   Gallstone pancreatitis Active Problems:   Hyperlipemia   Hypothyroid   Diabetes mellitus type 2, controlled ([place])   Essential hypertension   Pancreatitis   Gallstone pancreatitis Patient has epigastric pain, elevated lipase and CT scan confirming acute pancreatitis. GI consulted, MRCP ordered. This is being treated with keeping patient NPO and aggressive hydration with IV fluids. Narcotics for pain.  Cholelithiasis MRCP showed evidence of cholelithiasis without choledocholithiasis. Surprisingly MRCP did not even show evidence of acute pancreatitis even with the elevated lipase. Per GI will consult general surgery.  Hypertension Patient has been nothing by mouth, IV hydralazine as needed.  Diabetes mellitus type 2 Place patient on insulin sliding scale.  Hypothyroidism Synthroid.   Code Status: Full Code Family Communication: Plan discussed with the patient. Disposition Plan: Remains inpatient Diet: Diet NPO time specified  Consultants:  GI  Procedures:  None  Antibiotics:  None   Objective: Filed Vitals:   06/20/15 0935 06/20/15 1325  BP: 128/71 110/72  Pulse: 91 88  Temp: 97.5 F (36.4 C) 97.5 F (36.4 C)  Resp: 16 16    Intake/Output Summary (Last 24 hours) at 06/20/15 1435 Last data filed at 06/20/15 1400  Gross per 24 hour  Intake   1430 ml  Output      0 ml  Net   1430 ml   Filed Weights   06/20/15 0211  Weight: 74.39 kg (164 lb)    Exam: General: Alert and awake, oriented x3, not in any acute distress. HEENT: anicteric sclera, pupils reactive to light and accommodation, EOMI CVS: S1-S2 clear, no murmur rubs or gallops Chest: clear to auscultation bilaterally, no wheezing,  rales or rhonchi Abdomen: soft nontender, nondistended, normal bowel sounds, no organomegaly Extremities: no cyanosis, clubbing or edema noted bilaterally Neuro: Cranial nerves II-XII intact, no focal neurological deficits  Data Reviewed: Basic Metabolic Panel:  Recent Labs Lab 06/20/15 0416  NA 134*  K 4.0  CL 101  CO2 26  GLUCOSE 154*  BUN 14  CREATININE 1.18  CALCIUM 8.8*   Liver Function Tests:  Recent Labs Lab 06/19/15 1716 06/20/15 0416  AST 254* 148*  ALT 367* 259*  ALKPHOS 98 69  BILITOT 1.4* 1.1  PROT 6.5 6.2*  ALBUMIN 4.1 3.5    Recent Labs Lab 06/19/15 1716 06/20/15 0416  LIPASE 2298* 1618*  AMYLASE 617*  --    No results for input(s): AMMONIA in the last 168 hours. CBC:  Recent Labs Lab 06/19/15 1716 06/20/15 0416  WBC 11.8* 8.1  NEUTROABS 9.9* 5.9  HGB  --  11.2*  HCT 37.2* 32.6*  MCV 89 85.8  PLT 406* 346   Cardiac Enzymes: No results for input(s): CKTOTAL, CKMB, CKMBINDEX, TROPONINI in the last 168 hours. BNP (last 3 results) No results for input(s): BNP in the last 8760 hours.  ProBNP (last 3 results) No results for input(s): PROBNP in the last 8760 hours.  CBG:  Recent Labs Lab 06/19/15 2349 06/20/15 0331 06/20/15 0744 06/20/15 0853  GLUCAP 148* 140* 151* 150*    Micro Recent Results (from the past 240 hour(s))  Fecal occult blood, imunochemical     Status: None   Collection Time: 06/14/15 12:43 PM  Result Value Ref Range Status   Fecal Occult Bld Negative Negative Final  Studies: Ct Abdomen Pelvis Wo Contrast  06/19/2015  CLINICAL DATA:  Pain of the upper abdomen with nausea and vomiting. EXAM: CT ABDOMEN AND PELVIS WITHOUT CONTRAST TECHNIQUE: Multidetector CT imaging of the abdomen and pelvis was performed following the standard protocol without IV contrast. COMPARISON:  None. FINDINGS: Lower chest and abdominal wall:  Mild atelectasis at the bases. 5 mm right basilar pulmonary nodule. Extensive coronary  atherosclerosis Hepatobiliary: No focal liver abnormality.Calculi seen in the gallbladder fundus. Pancreas: Full appearance of the pancreatic head with subtle surrounding fat stranding. The neighboring duodenal folds appear thickened. No discrete mass lesion is seen and there is no ductal dilatation, but postcontrast imaging will be recommended. Spleen: Unremarkable. Adrenals/Urinary Tract: Negative adrenals. No hydronephrosis. Hilar calcifications are arterial Unremarkable bladder. Reproductive:No pathologic findings. Stomach/Bowel: Duodenal fold thickening along the pancreatic head as described. No obstruction. Vascular/Lymphatic: No acute vascular abnormality. No mass or adenopathy. Peritoneal: No ascites or pneumoperitoneum. Musculoskeletal: No acute abnormalities. IMPRESSION: 1. Fullness of the pancreatic head and neighboring duodenal fold thickening. Mild pancreatitis is favored but recommend enhanced CT or MRI. 2. Cholelithiasis. 3. 5 mm right pulmonary nodule. Given risk factors for bronchogenic carcinoma, follow-up chest CT at 6 - 12 months is recommended. This recommendation follows the consensus statement: Guidelines for Management of Small Pulmonary Nodules Detected on CT Scans: A Statement from the [place] as published in Radiology 2005;237:395-400. Electronically Signed   By: Monte Fantasia M.D.   On: 06/19/2015 08:50   Mr 3d Recon At Scanner  06/20/2015  CLINICAL DATA:  70 year old male with fullness noted in the pancreatic head and surrounding fat stranding on recent CT examination, concerning for potential pancreatitis. Followup study. EXAM: MRI ABDOMEN WITHOUT AND WITH CONTRAST (INCLUDING MRCP) TECHNIQUE: Multiplanar multisequence MR imaging of the abdomen was performed both before and after the administration of intravenous contrast. Heavily T2-weighted images of the biliary and pancreatic ducts were obtained, and three-dimensional MRCP images were rendered by post processing.  CONTRAST:  4mL MULTIHANCE GADOBENATE DIMEGLUMINE 529 MG/ML IV SOLN COMPARISON:  No prior abdominal MRI. CT the abdomen and pelvis 06/19/2015. FINDINGS: Lower chest:  Unremarkable. Hepatobiliary: There are mild diffuse loss of signal intensity throughout the hepatic parenchyma on out of phase dual echo images, compatible with a background of very mild hepatic steatosis. Sub cm lesion in the periphery of segment 4A of the liver is T1 hypointense, T2 hyperintense, and does not enhance, compatible with a tiny cyst or biliary hamartoma. No other larger more suspicious hepatic lesions are noted. MRCP images demonstrate no intra or extrahepatic biliary ductal dilatation. Common bile duct measures up to 5 mm in the porta hepatis. There are some tiny filling defects lying dependently in the gallbladder, compatible with tiny gallstones. Gallbladder is not distended. Gallbladder wall thickness is normal. No pericholecystic fluid. No filling defects in the common bile duct to suggest choledocholithiasis. Pancreas: No pancreatic mass. The area of perceived fullness in the pancreatic head on the recent CT examination appears to represent normal pancreatic parenchyma. No pancreatic ductal dilatation on MRCP images. No pancreatic or peripancreatic fluid or inflammatory changes. Pancreatic parenchyma is enhancing normally at this time. Spleen: Unremarkable. Adrenals/Urinary Tract: Bilateral kidneys and bilateral adrenal glands are normal in appearance. No hydroureteronephrosis in the visualized abdomen. Stomach/Bowel: Visualized portions are unremarkable. Specifically, duodenum is grossly normal in appearance on today's examination. Vascular/Lymphatic: Atherosclerotic disease throughout the visualized abdominal vasculature, without aneurysm. No lymphadenopathy noted in the abdomen. Other: No significant volume of ascites in the visualized peritoneal cavity.  Musculoskeletal: No aggressive osseous lesions are noted in the visualized  portions of the skeleton. IMPRESSION: 1. No pancreatic mass identified. 2. No overt imaging findings of pancreatitis at this time (despite the patient's elevated lipase levels). Importantly, the pancreatic parenchyma also enhances normally at this time. 3. Cholelithiasis without evidence of acute cholecystitis at this time. 4. No choledocholithiasis or findings to suggest biliary tract obstruction. 5. Mild hepatic steatosis. Electronically Signed   By: Vinnie Langton M.D.   On: 06/20/2015 08:57   Mr Jeananne Rama W/wo Cm/mrcp  06/20/2015  CLINICAL DATA:  70 year old male with fullness noted in the pancreatic head and surrounding fat stranding on recent CT examination, concerning for potential pancreatitis. Followup study. EXAM: MRI ABDOMEN WITHOUT AND WITH CONTRAST (INCLUDING MRCP) TECHNIQUE: Multiplanar multisequence MR imaging of the abdomen was performed both before and after the administration of intravenous contrast. Heavily T2-weighted images of the biliary and pancreatic ducts were obtained, and three-dimensional MRCP images were rendered by post processing. CONTRAST:  75mL MULTIHANCE GADOBENATE DIMEGLUMINE 529 MG/ML IV SOLN COMPARISON:  No prior abdominal MRI. CT the abdomen and pelvis 06/19/2015. FINDINGS: Lower chest:  Unremarkable. Hepatobiliary: There are mild diffuse loss of signal intensity throughout the hepatic parenchyma on out of phase dual echo images, compatible with a background of very mild hepatic steatosis. Sub cm lesion in the periphery of segment 4A of the liver is T1 hypointense, T2 hyperintense, and does not enhance, compatible with a tiny cyst or biliary hamartoma. No other larger more suspicious hepatic lesions are noted. MRCP images demonstrate no intra or extrahepatic biliary ductal dilatation. Common bile duct measures up to 5 mm in the porta hepatis. There are some tiny filling defects lying dependently in the gallbladder, compatible with tiny gallstones. Gallbladder is not distended.  Gallbladder wall thickness is normal. No pericholecystic fluid. No filling defects in the common bile duct to suggest choledocholithiasis. Pancreas: No pancreatic mass. The area of perceived fullness in the pancreatic head on the recent CT examination appears to represent normal pancreatic parenchyma. No pancreatic ductal dilatation on MRCP images. No pancreatic or peripancreatic fluid or inflammatory changes. Pancreatic parenchyma is enhancing normally at this time. Spleen: Unremarkable. Adrenals/Urinary Tract: Bilateral kidneys and bilateral adrenal glands are normal in appearance. No hydroureteronephrosis in the visualized abdomen. Stomach/Bowel: Visualized portions are unremarkable. Specifically, duodenum is grossly normal in appearance on today's examination. Vascular/Lymphatic: Atherosclerotic disease throughout the visualized abdominal vasculature, without aneurysm. No lymphadenopathy noted in the abdomen. Other: No significant volume of ascites in the visualized peritoneal cavity. Musculoskeletal: No aggressive osseous lesions are noted in the visualized portions of the skeleton. IMPRESSION: 1. No pancreatic mass identified. 2. No overt imaging findings of pancreatitis at this time (despite the patient's elevated lipase levels). Importantly, the pancreatic parenchyma also enhances normally at this time. 3. Cholelithiasis without evidence of acute cholecystitis at this time. 4. No choledocholithiasis or findings to suggest biliary tract obstruction. 5. Mild hepatic steatosis. Electronically Signed   By: Vinnie Langton M.D.   On: 06/20/2015 08:57    Scheduled Meds: . insulin aspart  0-9 Units Subcutaneous Q4H  . levothyroxine  37.5 mcg Intravenous Daily  . piperacillin-tazobactam (ZOSYN)  IV  3.375 g Intravenous 3 times per day   Continuous Infusions: . sodium chloride 100 mL/hr (06/20/15 1431)       Time spent: 35 minutes    [facility] A  [facility] Pager [phone] If 7PM-7AM,  please contact night-coverage at [email or web address], password [facility] 06/20/2015, 2:35 PM  LOS: 1 day

## 2015-06-21 LAB — COMPREHENSIVE METABOLIC PANEL
ALT: 166 U/L — ABNORMAL HIGH (ref 17–63)
AST: 68 U/L — ABNORMAL HIGH (ref 15–41)
Albumin: 3 g/dL — ABNORMAL LOW (ref 3.5–5.0)
Alkaline Phosphatase: 58 U/L (ref 38–126)
Anion gap: 13 (ref 5–15)
BILIRUBIN TOTAL: 1.4 mg/dL — AB (ref 0.3–1.2)
BUN: 17 mg/dL (ref 6–20)
CHLORIDE: 108 mmol/L (ref 101–111)
CO2: 18 mmol/L — ABNORMAL LOW (ref 22–32)
CREATININE: 1.06 mg/dL (ref 0.61–1.24)
Calcium: 8.2 mg/dL — ABNORMAL LOW (ref 8.9–10.3)
Glucose, Bld: 112 mg/dL — ABNORMAL HIGH (ref 65–99)
POTASSIUM: 3.4 mmol/L — AB (ref 3.5–5.1)
Sodium: 139 mmol/L (ref 135–145)
TOTAL PROTEIN: 5.9 g/dL — AB (ref 6.5–8.1)

## 2015-06-21 LAB — GLUCOSE, CAPILLARY
GLUCOSE-CAPILLARY: 103 mg/dL — AB (ref 65–99)
GLUCOSE-CAPILLARY: 109 mg/dL — AB (ref 65–99)
Glucose-Capillary: 115 mg/dL — ABNORMAL HIGH (ref 65–99)
Glucose-Capillary: 103 mg/dL — ABNORMAL HIGH (ref 65–99)

## 2015-06-21 LAB — LIPASE, BLOOD: LIPASE: 347 U/L — AB (ref 11–51)

## 2015-06-21 MED ORDER — CHLORHEXIDINE GLUCONATE 4 % EX LIQD
1.0000 "application " | Freq: Once | CUTANEOUS | Status: AC
  Administered 2015-06-22: 1 via TOPICAL
  Filled 2015-06-21 (×2): qty 15

## 2015-06-21 MED ORDER — CHLORHEXIDINE GLUCONATE 4 % EX LIQD
1.0000 "application " | Freq: Once | CUTANEOUS | Status: DC
  Filled 2015-06-21 (×2): qty 15

## 2015-06-21 MED ORDER — CETYLPYRIDINIUM CHLORIDE 0.05 % MT LIQD: 7.0000 mL | Freq: Two times a day (BID) | OROMUCOSAL | Status: DC

## 2015-06-21 MED ORDER — CETYLPYRIDINIUM CHLORIDE 0.05 % MT LIQD
7.0000 mL | Freq: Two times a day (BID) | OROMUCOSAL | Status: DC
  Administered 2015-06-21 (×2): 7 mL via OROMUCOSAL

## 2015-06-21 NOTE — Anesthesia Preprocedure Evaluation (Addendum)
Anesthesia Evaluation  Patient identified by MRN, date of birth, ID band Patient awake    Reviewed: Allergy & Precautions, NPO status , Patient's Chart, lab work & pertinent test results  Airway Mallampati: II       Dental  (+) Edentulous Upper, Edentulous Lower   Pulmonary neg pulmonary ROS, former smoker (quit 2000),    breath sounds clear to auscultation       Cardiovascular hypertension, Pt. on medications  Rhythm:Regular     Neuro/Psych negative neurological ROS  negative psych ROS   GI/Hepatic negative GI ROS, Gall stones, pancreatitis, resolving   Endo/Other  diabetes, Oral Hypoglycemic AgentsHypothyroidism   Renal/GU negative Renal ROS  negative genitourinary   Musculoskeletal negative musculoskeletal ROS (+)   Abdominal   Peds negative pediatric ROS (+)  Hematology negative hematology ROS (+) 11/35   Anesthesia Other Findings   Reproductive/Obstetrics negative OB ROS                           Anesthesia Physical Anesthesia Plan  ASA: II  Anesthesia Plan: General   Post-op Pain Management:    Induction: Intravenous  Airway Management Planned: Oral ETT  Additional Equipment:   Intra-op Plan:   Post-operative Plan: Extubation in OR  Informed Consent: I have reviewed the patients History and Physical, chart, labs and discussed the procedure including the risks, benefits and alternatives for the proposed anesthesia with the patient or authorized representative who has indicated his/her understanding and acceptance.     Plan Discussed with:   Anesthesia Plan Comments:         Anesthesia Quick Evaluation

## 2015-06-21 NOTE — Progress Notes (Signed)
[facility] PROGRESS NOTE Subjective patient feels really good no pain. Has been seen by surgery and plans are being made for elective cholecystectomy.  Objective: Vital signs in last 24 hours: Temp:  [97.5 F (36.4 C)-98.6 F (37 C)] 98.6 F (37 C) (03/03 0454) Pulse Rate:  [80-91] 87 (03/03 0454) Resp:  [16] 16 (03/03 0454) BP: (110-128)/(69-73) 118/69 mmHg (03/03 0454) SpO2:  [97 %-98 %] 98 % (03/03 0454) Weight:  [76.4 kg (168 lb 6.9 oz)] 76.4 kg (168 lb 6.9 oz) (03/03 0454) Last BM Date: 06/19/15  Intake/Output from previous day: 03/02 0701 - 03/03 0700 In: 3130 [I.V.:2980; IV Piggyback:150] Out: 325 [Urine:325] Intake/Output this shift:    PE: General-- no acute distress Heart-- Lungs-- Abdomen-- completely nontender  Lab Results:  Recent Labs  06/19/15 1716 06/20/15 0416  WBC 11.8* 8.1  HGB  --  11.2*  HCT 37.2* 32.6*  PLT 406* 346   BMET  Recent Labs  06/20/15 0416 06/21/15 0438  NA 134* 139  K 4.0 3.4*  CL 101 108  CO2 26 18*  CREATININE 1.18 1.06   LFT  Recent Labs  06/19/15 1716 06/20/15 0416 06/21/15 0438  PROT 6.5 6.2* 5.9*  AST 254* 148* 68*  ALT 367* 259* 166*  ALKPHOS 98 69 58  BILITOT 1.4* 1.1 1.4*  BILIDIR 0.91* 0.6*  --   IBILI  --  0.5  --    PT/INR No results for input(s): LABPROT, INR in the last 72 hours. PANCREAS  Recent Labs  06/19/15 1716 06/20/15 0416 06/21/15 0438  LIPASE 2298* 1618* 347*         Studies/Results: Ct Abdomen Pelvis Wo Contrast  06/19/2015  CLINICAL DATA:  Pain of the upper abdomen with nausea and vomiting. EXAM: CT ABDOMEN AND PELVIS WITHOUT CONTRAST TECHNIQUE: Multidetector CT imaging of the abdomen and pelvis was performed following the standard protocol without IV contrast. COMPARISON:  None. FINDINGS: Lower chest and abdominal wall:  Mild atelectasis at the bases. 5 mm right basilar pulmonary nodule. Extensive coronary atherosclerosis Hepatobiliary: No focal liver  abnormality.Calculi seen in the gallbladder fundus. Pancreas: Full appearance of the pancreatic head with subtle surrounding fat stranding. The neighboring duodenal folds appear thickened. No discrete mass lesion is seen and there is no ductal dilatation, but postcontrast imaging will be recommended. Spleen: Unremarkable. Adrenals/Urinary Tract: Negative adrenals. No hydronephrosis. Hilar calcifications are arterial Unremarkable bladder. Reproductive:No pathologic findings. Stomach/Bowel: Duodenal fold thickening along the pancreatic head as described. No obstruction. Vascular/Lymphatic: No acute vascular abnormality. No mass or adenopathy. Peritoneal: No ascites or pneumoperitoneum. Musculoskeletal: No acute abnormalities. IMPRESSION: 1. Fullness of the pancreatic head and neighboring duodenal fold thickening. Mild pancreatitis is favored but recommend enhanced CT or MRI. 2. Cholelithiasis. 3. 5 mm right pulmonary nodule. Given risk factors for bronchogenic carcinoma, follow-up chest CT at 6 - 12 months is recommended. This recommendation follows the consensus statement: Guidelines for Management of Small Pulmonary Nodules Detected on CT Scans: A Statement from the [place] as published in Radiology 2005;237:395-400. Electronically Signed   By: Monte Fantasia M.D.   On: 06/19/2015 08:50   Mr 3d Recon At Scanner  06/20/2015  CLINICAL DATA:  70 year old male with fullness noted in the pancreatic head and surrounding fat stranding on recent CT examination, concerning for potential pancreatitis. Followup study. EXAM: MRI ABDOMEN WITHOUT AND WITH CONTRAST (INCLUDING MRCP) TECHNIQUE: Multiplanar multisequence MR imaging of the abdomen was performed both before and after the administration of intravenous contrast. Heavily T2-weighted images  of the biliary and pancreatic ducts were obtained, and three-dimensional MRCP images were rendered by post processing. CONTRAST:  23mL MULTIHANCE GADOBENATE DIMEGLUMINE  529 MG/ML IV SOLN COMPARISON:  No prior abdominal MRI. CT the abdomen and pelvis 06/19/2015. FINDINGS: Lower chest:  Unremarkable. Hepatobiliary: There are mild diffuse loss of signal intensity throughout the hepatic parenchyma on out of phase dual echo images, compatible with a background of very mild hepatic steatosis. Sub cm lesion in the periphery of segment 4A of the liver is T1 hypointense, T2 hyperintense, and does not enhance, compatible with a tiny cyst or biliary hamartoma. No other larger more suspicious hepatic lesions are noted. MRCP images demonstrate no intra or extrahepatic biliary ductal dilatation. Common bile duct measures up to 5 mm in the porta hepatis. There are some tiny filling defects lying dependently in the gallbladder, compatible with tiny gallstones. Gallbladder is not distended. Gallbladder wall thickness is normal. No pericholecystic fluid. No filling defects in the common bile duct to suggest choledocholithiasis. Pancreas: No pancreatic mass. The area of perceived fullness in the pancreatic head on the recent CT examination appears to represent normal pancreatic parenchyma. No pancreatic ductal dilatation on MRCP images. No pancreatic or peripancreatic fluid or inflammatory changes. Pancreatic parenchyma is enhancing normally at this time. Spleen: Unremarkable. Adrenals/Urinary Tract: Bilateral kidneys and bilateral adrenal glands are normal in appearance. No hydroureteronephrosis in the visualized abdomen. Stomach/Bowel: Visualized portions are unremarkable. Specifically, duodenum is grossly normal in appearance on today's examination. Vascular/Lymphatic: Atherosclerotic disease throughout the visualized abdominal vasculature, without aneurysm. No lymphadenopathy noted in the abdomen. Other: No significant volume of ascites in the visualized peritoneal cavity. Musculoskeletal: No aggressive osseous lesions are noted in the visualized portions of the skeleton. IMPRESSION: 1. No  pancreatic mass identified. 2. No overt imaging findings of pancreatitis at this time (despite the patient's elevated lipase levels). Importantly, the pancreatic parenchyma also enhances normally at this time. 3. Cholelithiasis without evidence of acute cholecystitis at this time. 4. No choledocholithiasis or findings to suggest biliary tract obstruction. 5. Mild hepatic steatosis. Electronically Signed   By: Vinnie Langton M.D.   On: 06/20/2015 08:57   Mr Jeananne Rama W/wo Cm/mrcp  06/20/2015  CLINICAL DATA:  69 year old male with fullness noted in the pancreatic head and surrounding fat stranding on recent CT examination, concerning for potential pancreatitis. Followup study. EXAM: MRI ABDOMEN WITHOUT AND WITH CONTRAST (INCLUDING MRCP) TECHNIQUE: Multiplanar multisequence MR imaging of the abdomen was performed both before and after the administration of intravenous contrast. Heavily T2-weighted images of the biliary and pancreatic ducts were obtained, and three-dimensional MRCP images were rendered by post processing. CONTRAST:  60mL MULTIHANCE GADOBENATE DIMEGLUMINE 529 MG/ML IV SOLN COMPARISON:  No prior abdominal MRI. CT the abdomen and pelvis 06/19/2015. FINDINGS: Lower chest:  Unremarkable. Hepatobiliary: There are mild diffuse loss of signal intensity throughout the hepatic parenchyma on out of phase dual echo images, compatible with a background of very mild hepatic steatosis. Sub cm lesion in the periphery of segment 4A of the liver is T1 hypointense, T2 hyperintense, and does not enhance, compatible with a tiny cyst or biliary hamartoma. No other larger more suspicious hepatic lesions are noted. MRCP images demonstrate no intra or extrahepatic biliary ductal dilatation. Common bile duct measures up to 5 mm in the porta hepatis. There are some tiny filling defects lying dependently in the gallbladder, compatible with tiny gallstones. Gallbladder is not distended. Gallbladder wall thickness is normal. No  pericholecystic fluid. No filling defects in the common bile  duct to suggest choledocholithiasis. Pancreas: No pancreatic mass. The area of perceived fullness in the pancreatic head on the recent CT examination appears to represent normal pancreatic parenchyma. No pancreatic ductal dilatation on MRCP images. No pancreatic or peripancreatic fluid or inflammatory changes. Pancreatic parenchyma is enhancing normally at this time. Spleen: Unremarkable. Adrenals/Urinary Tract: Bilateral kidneys and bilateral adrenal glands are normal in appearance. No hydroureteronephrosis in the visualized abdomen. Stomach/Bowel: Visualized portions are unremarkable. Specifically, duodenum is grossly normal in appearance on today's examination. Vascular/Lymphatic: Atherosclerotic disease throughout the visualized abdominal vasculature, without aneurysm. No lymphadenopathy noted in the abdomen. Other: No significant volume of ascites in the visualized peritoneal cavity. Musculoskeletal: No aggressive osseous lesions are noted in the visualized portions of the skeleton. IMPRESSION: 1. No pancreatic mass identified. 2. No overt imaging findings of pancreatitis at this time (despite the patient's elevated lipase levels). Importantly, the pancreatic parenchyma also enhances normally at this time. 3. Cholelithiasis without evidence of acute cholecystitis at this time. 4. No choledocholithiasis or findings to suggest biliary tract obstruction. 5. Mild hepatic steatosis. Electronically Signed   By: Vinnie Langton M.D.   On: 06/20/2015 08:57    Medications: I have reviewed the patient's current medications.  Assessment/Plan: 1. Gallstone pancreatitis. No signs of CBD stone. Lipase dramatically improved patient clinically feels much better. Agree with going ahead with laparoscopic cholecystectomy per surgeries recommendation. We will be happy to see again as needed.   Zebulan Hinshaw JR,Eilis Chestnutt L 06/21/2015, 7:39 AM  This note was created using  voice recognition software. Minor errors may Have occurred unintentionally.  Pager: [phone] If no answer or after hours call [phone]

## 2015-06-21 NOTE — Progress Notes (Signed)
Pharmacy Antibiotic Note  Matthew Daugherty is a 70 y.o. male admitted on 06/19/2015 with intra-abdominal infection.  Pharmacy has been consulted for Zosyn dosing.  Noted plans for cholecystectomy once lipase improved.  Renal function has been stable  Plan: Continue Zosyn 3.375g IV q8h (4 hour infusion).  De-escalate once clinically appropriate Pharmacy to sign off- please re-consult as needed  Height: 5\' 10"  (177.8 cm) Weight: 168 lb 6.9 oz (76.4 kg) IBW/kg (Calculated) : 73  Temp (24hrs), Avg:97.9 F (36.6 C), Min:97.5 F (36.4 C), Max:98.6 F (37 C)   Recent Labs Lab 06/19/15 1716 06/20/15 0416 06/21/15 0438  WBC 11.8* 8.1  --   CREATININE  --  1.18 1.06    Estimated Creatinine Clearance: 69.8 mL/min (by C-G formula based on Cr of 1.06).    Allergies  Allergen Reactions  . Niaspan [Niacin Er] Other (See Comments)    Increases blood sugars     Antimicrobials this admission: 3/2 >> Zosyn >>  Dose adjustments this admission:   Microbiology results:  Thank you for allowing pharmacy to be a part of this patient's care.  Netta Cedars, PharmD, BCPS Pager: [phone] 06/21/2015, 8:39 AM

## 2015-06-21 NOTE — Progress Notes (Signed)
[facility] Progress Note     Subjective: Pt has no pain or N/V, ambulating well.  Thirst/hungry.  No complaints.  He was hoping surgery could be today.    Objective: Vital signs in last 24 hours: Temp:  [97.5 F (36.4 C)-98.6 F (37 C)] 98.6 F (37 C) (03/03 0454) Pulse Rate:  [80-88] 87 (03/03 0454) Resp:  [16] 16 (03/03 0454) BP: (110-125)/(69-73) 118/69 mmHg (03/03 0454) SpO2:  [97 %-98 %] 98 % (03/03 0454) Weight:  [76.4 kg (168 lb 6.9 oz)] 76.4 kg (168 lb 6.9 oz) (03/03 0454) Last BM Date: 06/21/15  Intake/Output from previous day: 03/02 0701 - 03/03 0700 In: 3130 [I.V.:2980; IV Piggyback:150] Out: 325 [Urine:325] Intake/Output this shift:    PE: Gen:  Alert, NAD, pleasant Abd: Soft, NT/ND, +BS, no HSM, incisions C/D/I, drain with minimal sanguinous drainage, no abdominal scars noted   Lab Results:   Recent Labs  06/19/15 1716 06/20/15 0416  WBC 11.8* 8.1  HGB  --  11.2*  HCT 37.2* 32.6*  PLT 406* 346   BMET  Recent Labs  06/20/15 0416 06/21/15 0438  NA 134* 139  K 4.0 3.4*  CL 101 108  CO2 26 18*  GLUCOSE 154* 112*  BUN 14 17  CREATININE 1.18 1.06  CALCIUM 8.8* 8.2*   PT/INR No results for input(s): LABPROT, INR in the last 72 hours. CMP     Component Value Date/Time   NA 139 06/21/2015 0438   NA 138 05/29/2015 1657   K 3.4* 06/21/2015 0438   CL 108 06/21/2015 0438   CO2 18* 06/21/2015 0438   GLUCOSE 112* 06/21/2015 0438   GLUCOSE 123* 05/29/2015 1657   BUN 17 06/21/2015 0438   BUN 17 05/29/2015 1657   CREATININE 1.06 06/21/2015 0438   CREATININE 1.20 11/03/2012 0815   CALCIUM 8.2* 06/21/2015 0438   PROT 5.9* 06/21/2015 0438   PROT 6.5 06/19/2015 1716   ALBUMIN 3.0* 06/21/2015 0438   ALBUMIN 4.1 06/19/2015 1716   AST 68* 06/21/2015 0438   ALT 166* 06/21/2015 0438   ALKPHOS 58 06/21/2015 0438   BILITOT 1.4* 06/21/2015 0438   BILITOT 1.4* 06/19/2015 1716   GFRNONAA >60 06/21/2015 0438   GFRNONAA 64 11/03/2012 0815    GFRAA >60 06/21/2015 0438   GFRAA 73 11/03/2012 0815   Lipase     Component Value Date/Time   LIPASE 347* 06/21/2015 0438       Studies/Results: Mr 3d Recon At Scanner  06/20/2015  CLINICAL DATA:  70 year old male with fullness noted in the pancreatic head and surrounding fat stranding on recent CT examination, concerning for potential pancreatitis. Followup study. EXAM: MRI ABDOMEN WITHOUT AND WITH CONTRAST (INCLUDING MRCP) TECHNIQUE: Multiplanar multisequence MR imaging of the abdomen was performed both before and after the administration of intravenous contrast. Heavily T2-weighted images of the biliary and pancreatic ducts were obtained, and three-dimensional MRCP images were rendered by post processing. CONTRAST:  78mL MULTIHANCE GADOBENATE DIMEGLUMINE 529 MG/ML IV SOLN COMPARISON:  No prior abdominal MRI. CT the abdomen and pelvis 06/19/2015. FINDINGS: Lower chest:  Unremarkable. Hepatobiliary: There are mild diffuse loss of signal intensity throughout the hepatic parenchyma on out of phase dual echo images, compatible with a background of very mild hepatic steatosis. Sub cm lesion in the periphery of segment 4A of the liver is T1 hypointense, T2 hyperintense, and does not enhance, compatible with a tiny cyst or biliary hamartoma. No other larger more suspicious hepatic lesions are noted. MRCP images demonstrate  no intra or extrahepatic biliary ductal dilatation. Common bile duct measures up to 5 mm in the porta hepatis. There are some tiny filling defects lying dependently in the gallbladder, compatible with tiny gallstones. Gallbladder is not distended. Gallbladder wall thickness is normal. No pericholecystic fluid. No filling defects in the common bile duct to suggest choledocholithiasis. Pancreas: No pancreatic mass. The area of perceived fullness in the pancreatic head on the recent CT examination appears to represent normal pancreatic parenchyma. No pancreatic ductal dilatation on MRCP  images. No pancreatic or peripancreatic fluid or inflammatory changes. Pancreatic parenchyma is enhancing normally at this time. Spleen: Unremarkable. Adrenals/Urinary Tract: Bilateral kidneys and bilateral adrenal glands are normal in appearance. No hydroureteronephrosis in the visualized abdomen. Stomach/Bowel: Visualized portions are unremarkable. Specifically, duodenum is grossly normal in appearance on today's examination. Vascular/Lymphatic: Atherosclerotic disease throughout the visualized abdominal vasculature, without aneurysm. No lymphadenopathy noted in the abdomen. Other: No significant volume of ascites in the visualized peritoneal cavity. Musculoskeletal: No aggressive osseous lesions are noted in the visualized portions of the skeleton. IMPRESSION: 1. No pancreatic mass identified. 2. No overt imaging findings of pancreatitis at this time (despite the patient's elevated lipase levels). Importantly, the pancreatic parenchyma also enhances normally at this time. 3. Cholelithiasis without evidence of acute cholecystitis at this time. 4. No choledocholithiasis or findings to suggest biliary tract obstruction. 5. Mild hepatic steatosis. Electronically Signed   By: Vinnie Langton M.D.   On: 06/20/2015 08:57   Mr Jeananne Rama W/wo Cm/mrcp  06/20/2015  CLINICAL DATA:  70 year old male with fullness noted in the pancreatic head and surrounding fat stranding on recent CT examination, concerning for potential pancreatitis. Followup study. EXAM: MRI ABDOMEN WITHOUT AND WITH CONTRAST (INCLUDING MRCP) TECHNIQUE: Multiplanar multisequence MR imaging of the abdomen was performed both before and after the administration of intravenous contrast. Heavily T2-weighted images of the biliary and pancreatic ducts were obtained, and three-dimensional MRCP images were rendered by post processing. CONTRAST:  19mL MULTIHANCE GADOBENATE DIMEGLUMINE 529 MG/ML IV SOLN COMPARISON:  No prior abdominal MRI. CT the abdomen and pelvis  06/19/2015. FINDINGS: Lower chest:  Unremarkable. Hepatobiliary: There are mild diffuse loss of signal intensity throughout the hepatic parenchyma on out of phase dual echo images, compatible with a background of very mild hepatic steatosis. Sub cm lesion in the periphery of segment 4A of the liver is T1 hypointense, T2 hyperintense, and does not enhance, compatible with a tiny cyst or biliary hamartoma. No other larger more suspicious hepatic lesions are noted. MRCP images demonstrate no intra or extrahepatic biliary ductal dilatation. Common bile duct measures up to 5 mm in the porta hepatis. There are some tiny filling defects lying dependently in the gallbladder, compatible with tiny gallstones. Gallbladder is not distended. Gallbladder wall thickness is normal. No pericholecystic fluid. No filling defects in the common bile duct to suggest choledocholithiasis. Pancreas: No pancreatic mass. The area of perceived fullness in the pancreatic head on the recent CT examination appears to represent normal pancreatic parenchyma. No pancreatic ductal dilatation on MRCP images. No pancreatic or peripancreatic fluid or inflammatory changes. Pancreatic parenchyma is enhancing normally at this time. Spleen: Unremarkable. Adrenals/Urinary Tract: Bilateral kidneys and bilateral adrenal glands are normal in appearance. No hydroureteronephrosis in the visualized abdomen. Stomach/Bowel: Visualized portions are unremarkable. Specifically, duodenum is grossly normal in appearance on today's examination. Vascular/Lymphatic: Atherosclerotic disease throughout the visualized abdominal vasculature, without aneurysm. No lymphadenopathy noted in the abdomen. Other: No significant volume of ascites in the visualized peritoneal cavity. Musculoskeletal: No  aggressive osseous lesions are noted in the visualized portions of the skeleton. IMPRESSION: 1. No pancreatic mass identified. 2. No overt imaging findings of pancreatitis at this time  (despite the patient's elevated lipase levels). Importantly, the pancreatic parenchyma also enhances normally at this time. 3. Cholelithiasis without evidence of acute cholecystitis at this time. 4. No choledocholithiasis or findings to suggest biliary tract obstruction. 5. Mild hepatic steatosis. Electronically Signed   By: Vinnie Langton M.D.   On: 06/20/2015 08:57    Anti-infectives: Anti-infectives    Start     Dose/Rate Route Frequency Ordered Stop   06/20/15 1400  piperacillin-tazobactam (ZOSYN) IVPB 3.375 g     3.375 g 12.5 mL/hr over 240 Minutes Intravenous 3 times per day 06/20/15 0638     06/20/15 0645  piperacillin-tazobactam (ZOSYN) IVPB 3.375 g     3.375 g 12.5 mL/hr over 240 Minutes Intravenous NOW 06/20/15 [id number] 06/20/15 1120       Assessment/Plan Gallstone pancreatitis Symptomatic Cholelithiasis -MRI showed no stones, bili went up a bit today, lipase down to 300 -Allow clears, NPO MN, IVF, pain control, antiemetics -SCD's and okay for heparin or lovenox today, but none after MN for possible surgery tomorrow if labs are improved -Ambulate and IS -Will plan for lap chole with IOC tomorrow or Sunday - ?single site vs normal protocol, posted for tomorrow in case he's ready -Recheck labs ordered for am. -On Zosyn   LOS: 2 days    Nat Christen 06/21/2015, 11:40 AM Pager: [phone]  (7am - 4:30pm M-F; 7am - 11:30am Sa/Su)

## 2015-06-21 NOTE — Progress Notes (Signed)
[facility] PROGRESS NOTE   Matthew Daugherty [id number] DOB: [birth year] DOA: 06/19/2015 PCP: Redge Gainer, MD  HPI/Subjective: Seen with wife at bedside, denies any new complaints. Pain is improved, he has no pain but slight tenderness with deep palpation.  Assessment/Plan: Principal Problem:   Gallstone pancreatitis Active Problems:   Hyperlipemia   Hypothyroid   Diabetes mellitus type 2, controlled (Antoine)   Essential hypertension   Pancreatitis   Gallstone pancreatitis Patient has epigastric pain, elevated lipase and CT scan confirming acute pancreatitis. GI consulted, MRCP showed cholelithiasis without choledocholithiasis This is being treated with keeping patient NPO and aggressive hydration with IV fluids. Narcotics for pain. Started on clear liquids, but these pancreatic is improving.  Cholelithiasis MRCP showed cholelithiasis without choledocholithiasis. Surprisingly MRCP did not even show evidence of acute pancreatitis even with the elevated lipase. General surgery consulted, laparoscopic cholecystectomy planned for the weekend  Hypertension Patient has been nothing by mouth, IV hydralazine as needed.  Diabetes mellitus type 2 Place patient on insulin sliding scale.  Hypothyroidism Synthroid.   Code Status: Full Code Family Communication: Plan discussed with the patient. Disposition Plan: Remains inpatient Diet: Diet clear liquid Room service appropriate?: Yes; Fluid consistency:: Thin Diet NPO time specified Except for: Sips with Meds  Consultants:  GI  Procedures:  None  Antibiotics:  None   Objective: Filed Vitals:   06/21/15 0454 06/21/15 1341  BP: 118/69 125/73  Pulse: 87 82  Temp: 98.6 F (37 C) 97.8 F (36.6 C)  Resp: 16 16    Intake/Output Summary (Last 24 hours) at 06/21/15 1637 Last data filed at 06/21/15 1400  Gross per 24 hour  Intake   1750 ml  Output    325 ml  Net   1425 ml   Filed Weights   06/20/15 0211  06/21/15 0454  Weight: 74.39 kg (164 lb) 76.4 kg (168 lb 6.9 oz)    Exam: General: Alert and awake, oriented x3, not in any acute distress. HEENT: anicteric sclera, pupils reactive to light and accommodation, EOMI CVS: S1-S2 clear, no murmur rubs or gallops Chest: clear to auscultation bilaterally, no wheezing, rales or rhonchi Abdomen: soft nontender, nondistended, normal bowel sounds, no organomegaly Extremities: no cyanosis, clubbing or edema noted bilaterally Neuro: Cranial nerves II-XII intact, no focal neurological deficits  Data Reviewed: Basic Metabolic Panel:  Recent Labs Lab 06/20/15 0416 06/21/15 0438  NA 134* 139  K 4.0 3.4*  CL 101 108  CO2 26 18*  GLUCOSE 154* 112*  BUN 14 17  CREATININE 1.18 1.06  CALCIUM 8.8* 8.2*   Liver Function Tests:  Recent Labs Lab 06/19/15 1716 06/20/15 0416 06/21/15 0438  AST 254* 148* 68*  ALT 367* 259* 166*  ALKPHOS 98 69 58  BILITOT 1.4* 1.1 1.4*  PROT 6.5 6.2* 5.9*  ALBUMIN 4.1 3.5 3.0*    Recent Labs Lab 06/19/15 1716 06/20/15 0416 06/21/15 0438  LIPASE 2298* 1618* 347*  AMYLASE 617*  --   --    No results for input(s): AMMONIA in the last 168 hours. CBC:  Recent Labs Lab 06/19/15 1716 06/20/15 0416  WBC 11.8* 8.1  NEUTROABS 9.9* 5.9  HGB  --  11.2*  HCT 37.2* 32.6*  MCV 89 85.8  PLT 406* 346   Cardiac Enzymes: No results for input(s): CKTOTAL, CKMB, CKMBINDEX, TROPONINI in the last 168 hours. BNP (last 3 results) No results for input(s): BNP in the last 8760 hours.  ProBNP (last 3 results) No results for input(s): PROBNP in  the last 8760 hours.  CBG:  Recent Labs Lab 2015/07/04 1615 Jul 04, 2015 1925 07-04-2015 2349 06/21/15 0406 06/21/15 0815  GLUCAP 118* 115* 109* 103* 103*    Micro Recent Results (from the past 240 hour(s))  Fecal occult blood, imunochemical     Status: None   Collection Time: 06/14/15 12:43 PM  Result Value Ref Range Status   Fecal Occult Bld Negative Negative Final       Studies: Mr 3d Recon At Scanner  07-04-2015  CLINICAL DATA:  70 year old male with fullness noted in the pancreatic head and surrounding fat stranding on recent CT examination, concerning for potential pancreatitis. Followup study. EXAM: MRI ABDOMEN WITHOUT AND WITH CONTRAST (INCLUDING MRCP) TECHNIQUE: Multiplanar multisequence MR imaging of the abdomen was performed both before and after the administration of intravenous contrast. Heavily T2-weighted images of the biliary and pancreatic ducts were obtained, and three-dimensional MRCP images were rendered by post processing. CONTRAST:  36mL MULTIHANCE GADOBENATE DIMEGLUMINE 529 MG/ML IV SOLN COMPARISON:  No prior abdominal MRI. CT the abdomen and pelvis 06/19/2015. FINDINGS: Lower chest:  Unremarkable. Hepatobiliary: There are mild diffuse loss of signal intensity throughout the hepatic parenchyma on out of phase dual echo images, compatible with a background of very mild hepatic steatosis. Sub cm lesion in the periphery of segment 4A of the liver is T1 hypointense, T2 hyperintense, and does not enhance, compatible with a tiny cyst or biliary hamartoma. No other larger more suspicious hepatic lesions are noted. MRCP images demonstrate no intra or extrahepatic biliary ductal dilatation. Common bile duct measures up to 5 mm in the porta hepatis. There are some tiny filling defects lying dependently in the gallbladder, compatible with tiny gallstones. Gallbladder is not distended. Gallbladder wall thickness is normal. No pericholecystic fluid. No filling defects in the common bile duct to suggest choledocholithiasis. Pancreas: No pancreatic mass. The area of perceived fullness in the pancreatic head on the recent CT examination appears to represent normal pancreatic parenchyma. No pancreatic ductal dilatation on MRCP images. No pancreatic or peripancreatic fluid or inflammatory changes. Pancreatic parenchyma is enhancing normally at this time. Spleen:  Unremarkable. Adrenals/Urinary Tract: Bilateral kidneys and bilateral adrenal glands are normal in appearance. No hydroureteronephrosis in the visualized abdomen. Stomach/Bowel: Visualized portions are unremarkable. Specifically, duodenum is grossly normal in appearance on today's examination. Vascular/Lymphatic: Atherosclerotic disease throughout the visualized abdominal vasculature, without aneurysm. No lymphadenopathy noted in the abdomen. Other: No significant volume of ascites in the visualized peritoneal cavity. Musculoskeletal: No aggressive osseous lesions are noted in the visualized portions of the skeleton. IMPRESSION: 1. No pancreatic mass identified. 2. No overt imaging findings of pancreatitis at this time (despite the patient's elevated lipase levels). Importantly, the pancreatic parenchyma also enhances normally at this time. 3. Cholelithiasis without evidence of acute cholecystitis at this time. 4. No choledocholithiasis or findings to suggest biliary tract obstruction. 5. Mild hepatic steatosis. Electronically Signed   By: Vinnie Langton M.D.   On: July 04, 2015 08:57   Mr Jeananne Rama W/wo Cm/mrcp  07-04-2015  CLINICAL DATA:  70 year old male with fullness noted in the pancreatic head and surrounding fat stranding on recent CT examination, concerning for potential pancreatitis. Followup study. EXAM: MRI ABDOMEN WITHOUT AND WITH CONTRAST (INCLUDING MRCP) TECHNIQUE: Multiplanar multisequence MR imaging of the abdomen was performed both before and after the administration of intravenous contrast. Heavily T2-weighted images of the biliary and pancreatic ducts were obtained, and three-dimensional MRCP images were rendered by post processing. CONTRAST:  80mL MULTIHANCE GADOBENATE DIMEGLUMINE 529 MG/ML IV SOLN  COMPARISON:  No prior abdominal MRI. CT the abdomen and pelvis 06/19/2015. FINDINGS: Lower chest:  Unremarkable. Hepatobiliary: There are mild diffuse loss of signal intensity throughout the hepatic  parenchyma on out of phase dual echo images, compatible with a background of very mild hepatic steatosis. Sub cm lesion in the periphery of segment 4A of the liver is T1 hypointense, T2 hyperintense, and does not enhance, compatible with a tiny cyst or biliary hamartoma. No other larger more suspicious hepatic lesions are noted. MRCP images demonstrate no intra or extrahepatic biliary ductal dilatation. Common bile duct measures up to 5 mm in the porta hepatis. There are some tiny filling defects lying dependently in the gallbladder, compatible with tiny gallstones. Gallbladder is not distended. Gallbladder wall thickness is normal. No pericholecystic fluid. No filling defects in the common bile duct to suggest choledocholithiasis. Pancreas: No pancreatic mass. The area of perceived fullness in the pancreatic head on the recent CT examination appears to represent normal pancreatic parenchyma. No pancreatic ductal dilatation on MRCP images. No pancreatic or peripancreatic fluid or inflammatory changes. Pancreatic parenchyma is enhancing normally at this time. Spleen: Unremarkable. Adrenals/Urinary Tract: Bilateral kidneys and bilateral adrenal glands are normal in appearance. No hydroureteronephrosis in the visualized abdomen. Stomach/Bowel: Visualized portions are unremarkable. Specifically, duodenum is grossly normal in appearance on today's examination. Vascular/Lymphatic: Atherosclerotic disease throughout the visualized abdominal vasculature, without aneurysm. No lymphadenopathy noted in the abdomen. Other: No significant volume of ascites in the visualized peritoneal cavity. Musculoskeletal: No aggressive osseous lesions are noted in the visualized portions of the skeleton. IMPRESSION: 1. No pancreatic mass identified. 2. No overt imaging findings of pancreatitis at this time (despite the patient's elevated lipase levels). Importantly, the pancreatic parenchyma also enhances normally at this time. 3.  Cholelithiasis without evidence of acute cholecystitis at this time. 4. No choledocholithiasis or findings to suggest biliary tract obstruction. 5. Mild hepatic steatosis. Electronically Signed   By: Vinnie Langton M.D.   On: 06/20/2015 08:57    Scheduled Meds: . antiseptic oral rinse  7 mL Mouth Rinse BID  . chlorhexidine  1 application Topical Once  . [START ON 06/22/2015] chlorhexidine  1 application Topical Once  . insulin aspart  0-9 Units Subcutaneous Q4H  . levothyroxine  37.5 mcg Intravenous Daily  . piperacillin-tazobactam (ZOSYN)  IV  3.375 g Intravenous 3 times per day   Continuous Infusions:       Time spent: 35 minutes    [facility]  [facility] Pager [phone] If 7PM-7AM, please contact night-coverage at [email or web address], password [facility] 06/21/2015, 4:37 PM  LOS: 2 days

## 2015-06-22 ENCOUNTER — Inpatient Hospital Stay (HOSPITAL_COMMUNITY): Payer: Medicare Other

## 2015-06-22 ENCOUNTER — Encounter (HOSPITAL_COMMUNITY): Payer: Self-pay | Admitting: Certified Registered Nurse Anesthetist

## 2015-06-22 ENCOUNTER — Inpatient Hospital Stay (HOSPITAL_COMMUNITY): Payer: Medicare Other | Admitting: Anesthesiology

## 2015-06-22 ENCOUNTER — Encounter (HOSPITAL_COMMUNITY)
Admission: RE | Disposition: A | Discharge status: Home / Self Care | Payer: Self-pay | Source: Ambulatory Visit | Attending: Internal Medicine

## 2015-06-22 HISTORY — PX: LAPAROSCOPIC CHOLECYSTECTOMY SINGLE SITE WITH INTRAOPERATIVE CHOLANGIOGRAM: SHX6538

## 2015-06-22 LAB — GLUCOSE, CAPILLARY
GLUCOSE-CAPILLARY: 162 mg/dL — AB (ref 65–99)
GLUCOSE-CAPILLARY: 158 mg/dL — AB (ref 65–99)
Glucose-Capillary: 168 mg/dL — ABNORMAL HIGH (ref 65–99)
Glucose-Capillary: 246 mg/dL — ABNORMAL HIGH (ref 65–99)

## 2015-06-22 LAB — COMPREHENSIVE METABOLIC PANEL
ALT: 136 U/L — ABNORMAL HIGH (ref 17–63)
AST: 53 U/L — ABNORMAL HIGH (ref 15–41)
Albumin: 3.5 g/dL (ref 3.5–5.0)
Alkaline Phosphatase: 67 U/L (ref 38–126)
Anion gap: 13 (ref 5–15)
BILIRUBIN TOTAL: 1.5 mg/dL — AB (ref 0.3–1.2)
BUN: 13 mg/dL (ref 6–20)
CHLORIDE: 108 mmol/L (ref 101–111)
CO2: 20 mmol/L — ABNORMAL LOW (ref 22–32)
Calcium: 8.9 mg/dL (ref 8.9–10.3)
Creatinine, Ser: 1.07 mg/dL (ref 0.61–1.24)
Glucose, Bld: 118 mg/dL — ABNORMAL HIGH (ref 65–99)
POTASSIUM: 3.8 mmol/L (ref 3.5–5.1)
Sodium: 141 mmol/L (ref 135–145)
TOTAL PROTEIN: 6.8 g/dL (ref 6.5–8.1)

## 2015-06-22 LAB — CBC
HEMATOCRIT: 35.1 % — AB (ref 39.0–52.0)
HEMOGLOBIN: 11.6 g/dL — AB (ref 13.0–17.0)
MCH: 29.3 pg (ref 26.0–34.0)
MCHC: 33 g/dL (ref 30.0–36.0)
MCV: 88.6 fL (ref 78.0–100.0)
Platelets: 418 10*3/uL — ABNORMAL HIGH (ref 150–400)
RBC: 3.96 MIL/uL — ABNORMAL LOW (ref 4.22–5.81)
RDW: 13.8 % (ref 11.5–15.5)
WBC: 6.2 10*3/uL (ref 4.0–10.5)

## 2015-06-22 LAB — LIPASE, BLOOD: LIPASE: 207 U/L — AB (ref 11–51)

## 2015-06-22 SURGERY — LAPAROSCOPIC CHOLECYSTECTOMY SINGLE SITE WITH INTRAOPERATIVE CHOLANGIOGRAM
Anesthesia: General | Site: Abdomen

## 2015-06-22 MED ORDER — VITAMIN D3 25 MCG (1000 UNIT) PO TABS
1000.0000 [IU] | ORAL_TABLET | Freq: Every day | ORAL | Status: DC
  Administered 2015-06-22 – 2015-06-23 (×2): 1000 [IU] via ORAL
  Filled 2015-06-22 (×2): qty 1

## 2015-06-22 MED ORDER — SODIUM CHLORIDE 0.9 % IV SOLN: 250.0000 mL | INTRAVENOUS | Status: DC | PRN

## 2015-06-22 MED ORDER — EZETIMIBE 10 MG PO TABS
10.0000 mg | ORAL_TABLET | Freq: Every day | ORAL | Status: DC
  Administered 2015-06-22 – 2015-06-23 (×2): 10 mg via ORAL
  Filled 2015-06-22 (×2): qty 1

## 2015-06-22 MED ORDER — SODIUM CHLORIDE 0.9% FLUSH
3.0000 mL | Freq: Two times a day (BID) | INTRAVENOUS | Status: DC
  Administered 2015-06-22: 3 mL via INTRAVENOUS

## 2015-06-22 MED ORDER — BUPIVACAINE-EPINEPHRINE (PF) 0.25% -1:200000 IJ SOLN
INTRAMUSCULAR | Status: AC
  Filled 2015-06-22: qty 30

## 2015-06-22 MED ORDER — LACTATED RINGERS IV BOLUS (SEPSIS): 1000.0000 mL | Freq: Three times a day (TID) | INTRAVENOUS | Status: DC | PRN

## 2015-06-22 MED ORDER — BISMUTH SUBSALICYLATE 262 MG/15ML PO SUSP
30.0000 mL | Freq: Three times a day (TID) | ORAL | Status: DC | PRN
  Filled 2015-06-22: qty 118

## 2015-06-22 MED ORDER — PROMETHAZINE HCL 25 MG/ML IJ SOLN: 6.2500 mg | INTRAMUSCULAR | Status: DC | PRN

## 2015-06-22 MED ORDER — BUPIVACAINE-EPINEPHRINE 0.25% -1:200000 IJ SOLN
INTRAMUSCULAR | Status: AC
  Filled 2015-06-22: qty 1

## 2015-06-22 MED ORDER — MIDAZOLAM HCL 2 MG/2ML IJ SOLN
INTRAMUSCULAR | Status: AC
  Filled 2015-06-22: qty 2

## 2015-06-22 MED ORDER — SUGAMMADEX SODIUM 500 MG/5ML IV SOLN
INTRAVENOUS | Status: DC | PRN
  Administered 2015-06-22: 400 mg via INTRAVENOUS

## 2015-06-22 MED ORDER — ONDANSETRON HCL 4 MG/2ML IJ SOLN
INTRAMUSCULAR | Status: AC
  Filled 2015-06-22: qty 2

## 2015-06-22 MED ORDER — ASPIRIN EC 81 MG PO TBEC
81.0000 mg | DELAYED_RELEASE_TABLET | Freq: Every day | ORAL | Status: DC
  Administered 2015-06-23: 81 mg via ORAL
  Filled 2015-06-22: qty 1

## 2015-06-22 MED ORDER — SODIUM CHLORIDE 0.9% FLUSH
3.0000 mL | INTRAVENOUS | Status: DC | PRN
  Administered 2015-06-22: 3 mL via INTRAVENOUS
  Filled 2015-06-22: qty 3

## 2015-06-22 MED ORDER — STERILE WATER FOR IRRIGATION IR SOLN
Status: DC | PRN
  Administered 2015-06-22: 1000 mL

## 2015-06-22 MED ORDER — TRAMADOL HCL 50 MG PO TABS: 50.0000 mg | ORAL_TABLET | Freq: Four times a day (QID) | ORAL | Status: DC | PRN

## 2015-06-22 MED ORDER — MAGIC MOUTHWASH
15.0000 mL | Freq: Four times a day (QID) | ORAL | Status: DC | PRN
  Filled 2015-06-22: qty 15

## 2015-06-22 MED ORDER — PROPOFOL 10 MG/ML IV BOLUS
INTRAVENOUS | Status: DC | PRN
  Administered 2015-06-22: 160 mg via INTRAVENOUS
  Administered 2015-06-22: 25 mg via INTRAVENOUS

## 2015-06-22 MED ORDER — ALUM & MAG HYDROXIDE-SIMETH 200-200-20 MG/5ML PO SUSP: 30.0000 mL | Freq: Four times a day (QID) | ORAL | Status: DC | PRN

## 2015-06-22 MED ORDER — PSYLLIUM 95 % PO PACK
1.0000 | PACK | Freq: Two times a day (BID) | ORAL | Status: DC
  Administered 2015-06-22: 1 via ORAL
  Filled 2015-06-22 (×4): qty 1

## 2015-06-22 MED ORDER — MORPHINE SULFATE (PF) 2 MG/ML IV SOLN: 1.0000 mg | INTRAVENOUS | Status: DC | PRN

## 2015-06-22 MED ORDER — LIDOCAINE HCL (CARDIAC) 20 MG/ML IV SOLN
INTRAVENOUS | Status: DC | PRN
  Administered 2015-06-22: 75 mg via INTRAVENOUS

## 2015-06-22 MED ORDER — VITAMIN D-3 125 MCG (5000 UT) PO TABS: 1.0000 | ORAL_TABLET | Freq: Every day | ORAL | Status: DC

## 2015-06-22 MED ORDER — PROPOFOL 10 MG/ML IV BOLUS
INTRAVENOUS | Status: AC
  Filled 2015-06-22: qty 20

## 2015-06-22 MED ORDER — EPHEDRINE SULFATE 50 MG/ML IJ SOLN
INTRAMUSCULAR | Status: DC | PRN
  Administered 2015-06-22 (×4): 5 mg via INTRAVENOUS

## 2015-06-22 MED ORDER — 0.9 % SODIUM CHLORIDE (POUR BTL) OPTIME
TOPICAL | Status: DC | PRN
  Administered 2015-06-22: 1000 mL

## 2015-06-22 MED ORDER — MENTHOL 3 MG MT LOZG
1.0000 | LOZENGE | OROMUCOSAL | Status: DC | PRN
  Filled 2015-06-22: qty 9

## 2015-06-22 MED ORDER — DEXAMETHASONE SODIUM PHOSPHATE 10 MG/ML IJ SOLN
INTRAMUSCULAR | Status: DC | PRN
  Administered 2015-06-22: 10 mg via INTRAVENOUS

## 2015-06-22 MED ORDER — FENTANYL CITRATE (PF) 100 MCG/2ML IJ SOLN
INTRAMUSCULAR | Status: DC | PRN
  Administered 2015-06-22 (×3): 50 ug via INTRAVENOUS
  Administered 2015-06-22: 100 ug via INTRAVENOUS

## 2015-06-22 MED ORDER — METHOCARBAMOL 1000 MG/10ML IJ SOLN
1000.0000 mg | Freq: Four times a day (QID) | INTRAVENOUS | Status: DC | PRN
  Filled 2015-06-22: qty 10

## 2015-06-22 MED ORDER — ONDANSETRON HCL 4 MG/2ML IJ SOLN
INTRAMUSCULAR | Status: DC | PRN
  Administered 2015-06-22 (×2): 2 mg via INTRAVENOUS

## 2015-06-22 MED ORDER — LIP MEDEX EX OINT
1.0000 "application " | TOPICAL_OINTMENT | Freq: Two times a day (BID) | CUTANEOUS | Status: DC
  Administered 2015-06-22 – 2015-06-23 (×3): 1 via TOPICAL
  Filled 2015-06-22: qty 7

## 2015-06-22 MED ORDER — LACTATED RINGERS IV SOLN
INTRAVENOUS | Status: DC | PRN
  Administered 2015-06-22 (×2): via INTRAVENOUS

## 2015-06-22 MED ORDER — DEXAMETHASONE SODIUM PHOSPHATE 10 MG/ML IJ SOLN
INTRAMUSCULAR | Status: AC
  Filled 2015-06-22: qty 1

## 2015-06-22 MED ORDER — ESMOLOL HCL 100 MG/10ML IV SOLN
INTRAVENOUS | Status: DC | PRN
Start: 2015-06-22 — End: 2015-06-22
  Administered 2015-06-22 (×2): 10 mg via INTRAVENOUS
  Administered 2015-06-22: 20 mg via INTRAVENOUS
  Administered 2015-06-22 (×2): 10 mg via INTRAVENOUS

## 2015-06-22 MED ORDER — LIDOCAINE HCL (CARDIAC) 20 MG/ML IV SOLN
INTRAVENOUS | Status: AC
  Filled 2015-06-22: qty 5

## 2015-06-22 MED ORDER — SUCCINYLCHOLINE CHLORIDE 20 MG/ML IJ SOLN
INTRAMUSCULAR | Status: DC | PRN
  Administered 2015-06-22: 120 mg via INTRAVENOUS

## 2015-06-22 MED ORDER — DIPHENHYDRAMINE HCL 50 MG/ML IJ SOLN: 12.5000 mg | Freq: Four times a day (QID) | INTRAMUSCULAR | Status: DC | PRN

## 2015-06-22 MED ORDER — FENTANYL CITRATE (PF) 250 MCG/5ML IJ SOLN
INTRAMUSCULAR | Status: AC
  Filled 2015-06-22: qty 5

## 2015-06-22 MED ORDER — FENTANYL CITRATE (PF) 100 MCG/2ML IJ SOLN: 25.0000 ug | INTRAMUSCULAR | Status: DC | PRN

## 2015-06-22 MED ORDER — DIPHENHYDRAMINE HCL 25 MG PO CAPS: 25.0000 mg | ORAL_CAPSULE | Freq: Four times a day (QID) | ORAL | Status: DC | PRN

## 2015-06-22 MED ORDER — LACTATED RINGERS IR SOLN
Status: DC | PRN
Start: 2015-06-22 — End: 2015-06-22
  Administered 2015-06-22: 1

## 2015-06-22 MED ORDER — IBUPROFEN 200 MG PO TABS: 400.0000 mg | ORAL_TABLET | Freq: Four times a day (QID) | ORAL | Status: DC | PRN

## 2015-06-22 MED ORDER — ROCURONIUM BROMIDE 100 MG/10ML IV SOLN
INTRAVENOUS | Status: AC
  Filled 2015-06-22: qty 1

## 2015-06-22 MED ORDER — ROCURONIUM BROMIDE 100 MG/10ML IV SOLN
INTRAVENOUS | Status: DC | PRN
  Administered 2015-06-22: 10 mg via INTRAVENOUS
  Administered 2015-06-22: 30 mg via INTRAVENOUS

## 2015-06-22 MED ORDER — SACCHAROMYCES BOULARDII 250 MG PO CAPS
250.0000 mg | ORAL_CAPSULE | Freq: Two times a day (BID) | ORAL | Status: DC
  Administered 2015-06-22 – 2015-06-23 (×3): 250 mg via ORAL
  Filled 2015-06-22 (×4): qty 1

## 2015-06-22 MED ORDER — BUPIVACAINE-EPINEPHRINE 0.25% -1:200000 IJ SOLN
INTRAMUSCULAR | Status: DC | PRN
  Administered 2015-06-22: 50 mL

## 2015-06-22 MED ORDER — METOPROLOL TARTRATE 1 MG/ML IV SOLN
5.0000 mg | Freq: Four times a day (QID) | INTRAVENOUS | Status: DC | PRN
  Filled 2015-06-22: qty 5

## 2015-06-22 MED ORDER — MIDAZOLAM HCL 5 MG/5ML IJ SOLN
INTRAMUSCULAR | Status: DC | PRN
  Administered 2015-06-22 (×2): 0.5 mg via INTRAVENOUS

## 2015-06-22 MED ORDER — MEPERIDINE HCL 50 MG/ML IJ SOLN: 6.2500 mg | INTRAMUSCULAR | Status: DC | PRN

## 2015-06-22 MED ORDER — SUGAMMADEX SODIUM 500 MG/5ML IV SOLN
INTRAVENOUS | Status: AC
  Filled 2015-06-22: qty 5

## 2015-06-22 MED ORDER — PHENOL 1.4 % MT LIQD: 2.0000 | OROMUCOSAL | Status: DC | PRN

## 2015-06-22 MED ORDER — LEVOTHYROXINE SODIUM 75 MCG PO TABS
75.0000 ug | ORAL_TABLET | Freq: Every day | ORAL | Status: DC
  Administered 2015-06-22 – 2015-06-23 (×2): 75 ug via ORAL
  Filled 2015-06-22 (×3): qty 1

## 2015-06-22 SURGICAL SUPPLY — 42 items
APPLIER CLIP 5 13 M/L LIGAMAX5 (MISCELLANEOUS) ×3
APR CLP MED LRG 5 ANG JAW (MISCELLANEOUS) ×1
BAG SPEC RTRVL LRG 6X4 10 (ENDOMECHANICALS) ×1
CABLE HIGH FREQUENCY MONO STRZ (ELECTRODE) ×3 IMPLANT
CHLORAPREP W/TINT 26ML (MISCELLANEOUS) ×3 IMPLANT
CLIP APPLIE 5 13 M/L LIGAMAX5 (MISCELLANEOUS) ×1 IMPLANT
COVER MAYO STAND STRL (DRAPES) ×3 IMPLANT
COVER SURGICAL LIGHT HANDLE (MISCELLANEOUS) ×3 IMPLANT
DECANTER SPIKE VIAL GLASS SM (MISCELLANEOUS) ×3 IMPLANT
DRAIN CHANNEL 19F RND (DRAIN) IMPLANT
DRAPE C-ARM 42X120 X-RAY (DRAPES) ×3 IMPLANT
DRAPE LAPAROSCOPIC ABDOMINAL (DRAPES) ×3 IMPLANT
DRAPE WARM FLUID 44X44 (DRAPE) ×3 IMPLANT
DRSG TEGADERM 4X4.75 (GAUZE/BANDAGES/DRESSINGS) ×3 IMPLANT
ELECT REM PT RETURN 9FT ADLT (ELECTROSURGICAL) ×3
ELECTRODE REM PT RTRN 9FT ADLT (ELECTROSURGICAL) ×1 IMPLANT
ENDOLOOP SUT PDS II  0 18 (SUTURE)
ENDOLOOP SUT PDS II 0 18 (SUTURE) IMPLANT
EVACUATOR SILICONE 100CC (DRAIN) IMPLANT
GAUZE SPONGE 2X2 8PLY STRL LF (GAUZE/BANDAGES/DRESSINGS) ×1 IMPLANT
GAUZE SPONGE 4X4 12PLY STRL (GAUZE/BANDAGES/DRESSINGS) ×2 IMPLANT
GLOVE ECLIPSE 8.0 STRL XLNG CF (GLOVE) ×3 IMPLANT
GLOVE INDICATOR 8.0 STRL GRN (GLOVE) ×3 IMPLANT
GOWN STRL REUS W/TWL XL LVL3 (GOWN DISPOSABLE) ×6 IMPLANT
KIT BASIN OR (CUSTOM PROCEDURE TRAY) ×3 IMPLANT
PAD POSITIONING PINK XL (MISCELLANEOUS) ×3 IMPLANT
POSITIONER SURGICAL ARM (MISCELLANEOUS) IMPLANT
POUCH SPECIMEN RETRIEVAL 10MM (ENDOMECHANICALS) ×2 IMPLANT
SCISSORS LAP 5X35 DISP (ENDOMECHANICALS) ×3 IMPLANT
SET CHOLANGIOGRAPH MIX (MISCELLANEOUS) ×3 IMPLANT
SET IRRIG TUBING LAPAROSCOPIC (IRRIGATION / IRRIGATOR) ×3 IMPLANT
SHEARS HARMONIC ACE PLUS 36CM (ENDOMECHANICALS) ×3 IMPLANT
SPONGE GAUZE 2X2 STER 10/PKG (GAUZE/BANDAGES/DRESSINGS) ×2
SUT MNCRL AB 4-0 PS2 18 (SUTURE) ×3 IMPLANT
SUT PDS AB 1 CT1 27 (SUTURE) ×10 IMPLANT
SYR 20CC LL (SYRINGE) ×3 IMPLANT
TOWEL OR 17X26 10 PK STRL BLUE (TOWEL DISPOSABLE) ×3 IMPLANT
TOWEL OR NON WOVEN STRL DISP B (DISPOSABLE) ×3 IMPLANT
TRAY LAPAROSCOPIC (CUSTOM PROCEDURE TRAY) ×3 IMPLANT
TROCAR BLADELESS OPT 5 100 (ENDOMECHANICALS) ×3 IMPLANT
TROCAR BLADELESS OPT 5 150 (ENDOMECHANICALS) ×3 IMPLANT
TUBING INSUF HEATED (TUBING) ×3 IMPLANT

## 2015-06-22 NOTE — Anesthesia Postprocedure Evaluation (Signed)
Anesthesia Post Note  Patient: Matthew Daugherty  Procedure(s) Performed: Procedure(s) (LRB): LAPAROSCOPIC CHOLECYSTECTOMY SINGLE SITE WITH INTRAOPERATIVE CHOLANGIOGRAM, PRIMARY UMBILICAL HERNIA REPAIR (N/A)  Patient location during evaluation: PACU Anesthesia Type: General Level of consciousness: awake and alert Pain management: pain level controlled Vital Signs Assessment: post-procedure vital signs reviewed and stable Respiratory status: spontaneous breathing, nonlabored ventilation, respiratory function stable and patient connected to nasal cannula oxygen Cardiovascular status: blood pressure returned to baseline and stable Postop Assessment: no signs of nausea or vomiting Anesthetic complications: no    Last Vitals:  Filed Vitals:   06/22/15 0945 06/22/15 0950  BP: 137/76 134/69  Pulse: 120 112  Temp: 36.6 C   Resp: 19 14    Last Pain:  Filed Vitals:   06/22/15 0953  PainSc: 0-No pain                 Ivylynn Hoppes

## 2015-06-22 NOTE — Progress Notes (Signed)
[facility] PROGRESS NOTE   Matthew Daugherty [id number] DOB: 1946-02-22 DOA: 06/19/2015 PCP: Redge Gainer, MD  HPI/Subjective: Seen after surgery, still sore.    Assessment/Plan: Principal Problem:   Gallstone pancreatitis Active Problems:   Hyperlipemia   Hypothyroid   Diabetes mellitus type 2, controlled ([place])   Essential hypertension   Pancreatitis   Gallstone pancreatitis Patient has epigastric pain, elevated lipase and CT scan confirming acute pancreatitis. GI consulted, MRCP showed cholelithiasis without choledocholithiasis This is being treated with keeping patient NPO and aggressive hydration with IV fluids. Narcotics for pain. Just had his laparoscopic cholecystectomy, restarted on clear liquids.  Cholelithiasis MRCP showed cholelithiasis without choledocholithiasis. Surprisingly MRCP did not even show evidence of acute pancreatitis even with the elevated lipase. Status post laparoscopic cholecystectomy done on 06/22/2015.   Hypertension Patient has been nothing by mouth, IV hydralazine as needed.  Diabetes mellitus type 2 Place patient on insulin sliding scale.  Hypothyroidism Synthroid.   Code Status: Full Code Family Communication: Plan discussed with the patient. Disposition Plan: Remains inpatient Diet: Diet - low sodium heart healthy Diet clear liquid Room service appropriate?: Yes; Fluid consistency:: Thin  Consultants:  GI  Procedures:  None  Antibiotics:  None   Objective: Filed Vitals:   06/22/15 1127 06/22/15 1242  BP: 121/71 114/61  Pulse: 102 96  Temp: 97.9 F (36.6 C) 98.2 F (36.8 C)  Resp: 18 18    Intake/Output Summary (Last 24 hours) at 06/22/15 1329 Last data filed at 06/22/15 1000  Gross per 24 hour  Intake   1745 ml  Output    300 ml  Net   1445 ml   Filed Weights   06/20/15 0211 06/21/15 0454  Weight: 74.39 kg (164 lb) 76.4 kg (168 lb 6.9 oz)    Exam: General: Alert and awake, oriented x3, not  in any acute distress. HEENT: anicteric sclera, pupils reactive to light and accommodation, EOMI CVS: S1-S2 clear, no murmur rubs or gallops Chest: clear to auscultation bilaterally, no wheezing, rales or rhonchi Abdomen: soft nontender, nondistended, normal bowel sounds, no organomegaly Extremities: no cyanosis, clubbing or edema noted bilaterally Neuro: Cranial nerves II-XII intact, no focal neurological deficits  Data Reviewed: Basic Metabolic Panel:  Recent Labs Lab 06/20/15 0416 06/21/15 0438 06/22/15 0521  NA 134* 139 141  K 4.0 3.4* 3.8  CL 101 108 108  CO2 26 18* 20*  GLUCOSE 154* 112* 118*  BUN 14 17 13   CREATININE 1.18 1.06 1.07  CALCIUM 8.8* 8.2* 8.9   Liver Function Tests:  Recent Labs Lab 06/19/15 1716 06/20/15 0416 06/21/15 0438 06/22/15 0521  AST 254* 148* 68* 53*  ALT 367* 259* 166* 136*  ALKPHOS 98 69 58 67  BILITOT 1.4* 1.1 1.4* 1.5*  PROT 6.5 6.2* 5.9* 6.8  ALBUMIN 4.1 3.5 3.0* 3.5    Recent Labs Lab 06/19/15 1716 06/20/15 0416 06/21/15 0438 06/22/15 0521  LIPASE 2298* 1618* 347* 207*  AMYLASE 617*  --   --   --    No results for input(s): AMMONIA in the last 168 hours. CBC:  Recent Labs Lab 06/19/15 1716 06/20/15 0416 06/22/15 0521  WBC 11.8* 8.1 6.2  NEUTROABS 9.9* 5.9  --   HGB  --  11.2* 11.6*  HCT 37.2* 32.6* 35.1*  MCV 89 85.8 88.6  PLT 406* 346 418*   Cardiac Enzymes: No results for input(s): CKTOTAL, CKMB, CKMBINDEX, TROPONINI in the last 168 hours. BNP (last 3 results) No results for input(s): BNP in  the last 8760 hours.  ProBNP (last 3 results) No results for input(s): PROBNP in the last 8760 hours.  CBG:  Recent Labs Lab 06/20/15 2349 06/21/15 0406 06/21/15 0815 06/22/15 1002 06/22/15 1145  GLUCAP 109* 103* 103* 162* 158*    Micro Recent Results (from the past 240 hour(s))  Fecal occult blood, imunochemical     Status: None   Collection Time: 06/14/15 12:43 PM  Result Value Ref Range Status   Fecal  Occult Bld Negative Negative Final     Studies: Dg Cholangiogram Operative  06/22/2015  CLINICAL DATA:  Cholecystitis EXAM: INTRAOPERATIVE CHOLANGIOGRAM TECHNIQUE: Cholangiographic images from the C-arm fluoroscopic device were submitted for interpretation post-operatively. Please see the procedural report for the amount of contrast and the fluoroscopy time utilized. COMPARISON:  MR 06/20/2015 FINDINGS: No persistent filling defects in the common duct. Intrahepatic ducts are incompletely visualized, appearing decompressed centrally. Contrast passes into the duodenum. : Negative for retained common duct stone. Electronically Signed   By: Lucrezia Europe M.D.   On: 06/22/2015 09:21    Scheduled Meds: . antiseptic oral rinse  7 mL Mouth Rinse BID  . [START ON 06/23/2015] aspirin EC  81 mg Oral Daily  . chlorhexidine  1 application Topical Once  . cholecalciferol  1,000 Units Oral Daily  . ezetimibe  10 mg Oral Daily  . insulin aspart  0-9 Units Subcutaneous Q4H  . levothyroxine  75 mcg Oral QAC breakfast  . lip balm  1 application Topical BID  . piperacillin-tazobactam (ZOSYN)  IV  3.375 g Intravenous 3 times per day  . psyllium  1 packet Oral BID  . saccharomyces boulardii  250 mg Oral BID  . sodium chloride flush  3 mL Intravenous Q12H   Continuous Infusions:       Time spent: 35 minutes    [facility]  [facility] Pager [phone] If 7PM-7AM, please contact night-coverage at [email or web address], password [facility] 06/22/2015, 1:29 PM  LOS: 3 days

## 2015-06-22 NOTE — Op Note (Signed)
06/22/2015  9:31 AM  PATIENT:  Matthew Daugherty  70 y.o. male  Patient Care Team: Chipper Herb, MD as PCP - General (Family Medicine) Clarene Essex, MD (Gastroenterology)  PRE-OPERATIVE DIAGNOSIS:  chronic cholecystitis with gallstone pancreatitis  POST-OPERATIVE DIAGNOSIS:    chronic cholecystitis gallstone pancreatitis umbilical hernia  PROCEDURE:  LAPAROSCOPIC CHOLECYSTECTOMY SINGLE SITE WITH INTRAOPERATIVE CHOLANGIOGRAM,  PRIMARY UMBILICAL HERNIA REPAIR  SURGEON:  Surgeon(s): Michael Boston, MD  ASSISTANT: RN   ANESTHESIA:   local and general  EBL:  Total I/O In: 1000 [I.V.:1000] Out: -   Delay start of Pharmacological VTE agent (>24hrs) due to surgical blood loss or risk of bleeding:  no  DRAINS: none   SPECIMEN:  Source of Specimen:  Gallbladder   DISPOSITION OF SPECIMEN:  PATHOLOGY  COUNTS:  YES  PLAN OF CARE: Admit for overnight observation  PATIENT DISPOSITION:  PACU - hemodynamically stable.  INDICATION: Pleasant elderly gentleman with episode of pain and pancreatitis by lab and CT scan and history and physical.  Gallbladder etiology suspected given gallstones and gallbladder wall thickening.  Lipase has improved.  MRCP argues against any obvious common bile duct stone and pancreatitis improved.  Patient pain free now on hospital day 2.  I offered cholecystectomy.  Discussed with the patient and his wife in the holding area at length.  The anatomy & physiology of hepatobiliary & pancreatic function was discussed.  The pathophysiology of gallbladder dysfunction was discussed.  Natural history risks without surgery was discussed.   I feel the risks of no intervention will lead to serious problems that outweigh the operative risks; therefore, I recommended cholecystectomy to remove the pathology.  I explained laparoscopic techniques with possible need for an open approach.  Probable cholangiogram to evaluate the bilary tract was explained as well.    Risks such as  bleeding, infection, abscess, leak, injury to other organs, need for further treatment, heart attack, death, and other risks were discussed.  I noted a good likelihood this will help address the problem.  Possibility that this will not correct all abdominal symptoms was explained.  Goals of post-operative recovery were discussed as well.  We will work to minimize complications.  An educational handout further explaining the pathology and treatment options was given as well.  Questions were answered.  The patient & his wife expressed understanding & wishes to proceed with surgery.   OR FINDINGS: Thickened gallbladder wall consistent with chronic cholecystitis.  Rather intrahepatic gallbladder.  Cholangiogram with no evidence of any common bile duct dilatation stone or obstruction.  No leak.  Rather classic biliary anatomy.  Liver without any evidence cirrhosis or obvious hepatitis.  No biopsy done.  No intra-abdominal adhesions or evidence of obstruction or other obvious problems.  15 mm umbilical hernia.  Primarily repaired.  DESCRIPTION:   The patient was identified & brought in the operating room. The patient was positioned supine with arms tucked. SCDs were active during the entire case. The patient underwent general anesthesia without any difficulty.  The abdomen was prepped and draped in a sterile fashion. A Surgical Timeout confirmed our plan.  I made a transverse curvilinear incision through the superior umbilical fold.  Encountered umbilical hernia.  I placed stitches on the corner to tighten it down.  I placed a 3mm long port through the umbilical hernia. I began carbon dioxide insufflation.  Entry was clean.  Camera inspection revealed no injury. There were no adhesions to the anterior abdominal wall.  I proceeded to continue with  single site technique. I placed a #5 port in left upper aspect of the wound. I placed a 5 mm atraumatic grasper in the right inferior aspect of the wound.  I  turned attention to the right upper quadrant.  Adhesions of greater omentum freed off the gallbladder to expose the dome.  The gallbladder fundus was elevated cephalad.  I freed remaining greater omental mesocolon and duodenal adhesions to the gallbladder to expose the infundibulum.  I freed the peritoneal coverings between the gallbladder and the liver on the posteriolateral and anteriomedial walls. I alternated between Harmonic & blunt [place] dissection to help get a good critical view of the cystic artery and cystic duct. I did further dissection to free a few centimeters of the  gallbladder off the liver bed to get a good critical view of the infundibulum and cystic duct. I mobilized the cystic artery; and, after getting a good 360 view, ligated the cystic artery using the Harmonic ultrasonic dissection. I skeletonized the cystic duct.  Gallbladder was rather intrahepatic.  Eventually mobilized it to free 90% of the gallbladder off the liver bed while fluoroscopy set up.  I placed a clip on the infundibulum. I did a partial cystic duct-otomy and ensured patency. I placed a 5 [place] cholangiocatheter through a puncture site at the right subcostal ridge of the abdominal wall and directed it into the cystic duct.  I freed the gallbladder from its remaining attachments to the liver.  We ran a cholangiogram with dilute radio-opaque contrast and continuous fluoroscopy. Contrast flowed from a corkscrew side branch consistent with cystic duct cannulization. Contrast flowed up the common hepatic duct into the right and left intrahepatic chains out to secondary radicals. Contrast flowed down the common bile duct easily across the normal ampulla into the duodenum.  This was consistent with a normal cholangiogram.  I removed the cholangiocatheter. I placed clips on the cystic duct x4.  I completed cystic duct transection.  I ensured hemostasis on the gallbladder fossa of the liver and elsewhere. I inspected the  rest of the abdomen & detected no injury nor bleeding elsewhere.  I removed the gallbladder out the umbilical hernia.  I primarily closed the umbilical hernia with interrupted transverse #1 PDS suture to good result.  I closed the skin using 4-0 monocryl stitch.  Sterile dressing was applied. The patient was extubated & arrived in the PACU in stable condition..  I had discussed postoperative care with the patient in the holding area.  I am about to locate the patient's wife and discuss operative findings and postoperative goals / instructions.  Instructions are written in the chart as well.  Adin Hector, M.D., F.A.C.S. Gastrointestinal and Minimally Invasive Surgery [facility] [address] [place], [place] [address] [phone] Main / Paging

## 2015-06-22 NOTE — Transfer of Care (Signed)
Immediate Anesthesia Transfer of Care Note  Patient: Matthew Daugherty  Procedure(s) Performed: Procedure(s): LAPAROSCOPIC CHOLECYSTECTOMY SINGLE SITE WITH INTRAOPERATIVE CHOLANGIOGRAM, PRIMARY UMBILICAL HERNIA REPAIR (N/A)  Patient Location: PACU  Anesthesia Type:General  Level of Consciousness: awake, alert , oriented and patient cooperative  Airway & Oxygen Therapy: Patient Spontanous Breathing and Patient connected to face mask oxygen  Post-op Assessment: Report given to RN, Post -op Vital signs reviewed and stable and Patient moving all extremities  Post vital signs: Reviewed and stable  Last Vitals:  Filed Vitals:   06/22/15 0457 06/22/15 0945  BP: 108/87   Pulse: 71   Temp: 36.7 C 36.6 C  Resp: 18     Complications: No apparent anesthesia complications

## 2015-06-22 NOTE — Transfer of Care (Signed)
Immediate Anesthesia Transfer of Care Note  Patient: Matthew Daugherty  Procedure(s) Performed: Procedure(s): LAPAROSCOPIC CHOLECYSTECTOMY SINGLE SITE WITH INTRAOPERATIVE CHOLANGIOGRAM, PRIMARY UMBILICAL HERNIA REPAIR (N/A)  Patient Location: PACU  Anesthesia Type:General  Level of Consciousness: awake, alert , oriented and patient cooperative  Airway & Oxygen Therapy: Patient Spontanous Breathing and Patient connected to face mask oxygen  Post-op Assessment: Report given to RN, Post -op Vital signs reviewed and stable and Patient moving all extremities  Post vital signs: Reviewed and stable  Last Vitals:  Filed Vitals:   06/22/15 0945 06/22/15 0950  BP: 137/76 134/69  Pulse: 120 112  Temp: 36.6 C   Resp: 19 14    Complications: No apparent anesthesia complications

## 2015-06-22 NOTE — Anesthesia Procedure Notes (Signed)
Procedure Name: Intubation Date/Time: 06/22/2015 7:58 AM Performed by: Ofilia Neas Pre-anesthesia Checklist: Patient identified, Emergency Drugs available, Suction available, Patient being monitored and Timeout performed Patient Re-evaluated:Patient Re-evaluated prior to inductionOxygen Delivery Method: Circle system utilized Preoxygenation: Pre-oxygenation with 100% oxygen Intubation Type: IV induction Ventilation: Mask ventilation without difficulty Laryngoscope Size: Mac and 4 Grade View: Grade II Tube type: Oral Tube size: 7.5 mm Number of attempts: 1 Airway Equipment and Method: Stylet Placement Confirmation: ETT inserted through vocal cords under direct vision,  positive ETCO2 and breath sounds checked- equal and bilateral Secured at: 21 cm Tube secured with: Tape Dental Injury: Teeth and Oropharynx as per pre-operative assessment

## 2015-06-22 NOTE — Progress Notes (Signed)
[address] [place], [place] [address] Phone: [phone] FAX: [phone]   Matthew Daugherty [id number] Dec 21, 1945   Assessment  Problem List:   Principal Problem:   Gallstone pancreatitis Active Problems:   Hyperlipemia   Hypothyroid   Diabetes mellitus type 2, controlled ([place])   Essential hypertension   Pancreatitis   Day of Surgery  06/19/2015 - 06/22/2015  Procedure(s): LAPAROSCOPIC CHOLECYSTECTOMY SINGLE SITE WITH INTRAOPERATIVE CHOLANGIOGRAM    GS pancreatitis resolving with improved MRI, dec lipase, and no pain  Plan:  Lap chole w IOC today:  The anatomy & physiology of hepatobiliary & pancreatic function was discussed.  The pathophysiology of gallbladder dysfunction was discussed.  Natural history risks without surgery was discussed.   I feel the risks of no intervention will lead to serious problems that outweigh the operative risks; therefore, I recommended cholecystectomy to remove the pathology.  I explained laparoscopic techniques with possible need for an open approach.  Probable cholangiogram to evaluate the bilary tract was explained as well.    Risks such as bleeding, infection, abscess, leak, injury to other organs, need for repair of tissues / organs, need for further treatment, stroke, heart attack, death, and other risks were discussed.  I noted a good likelihood this will help address the problem.  Possibility that this will not correct all abdominal symptoms was explained.  Goals of post-operative recovery were discussed as well.  We will work to minimize complications.  An educational handout further explaining the pathology and treatment options was given as well.  Questions were answered.  The patient & his wife express understanding & wishes to proceed with surgery.  -If IOC+ - ERCP/GI consult -VTE prophylaxis- SCDs, etc -mobilize as tolerated to help recovery  Adin Hector, M.D.,  F.A.C.S. Gastrointestinal and Minimally Invasive Surgery [facility] [address] [place], [place] [address] [phone] Main / Paging   06/22/2015  Subjective:  No pain Has questions No n/a  Objective:  Vital signs:  Filed Vitals:   06/21/15 0454 06/21/15 1341 06/21/15 2145 06/22/15 0457  BP: 118/69 125/73 143/78 108/87  Pulse: 87 82 72 71  Temp: 98.6 F (37 C) 97.8 F (36.6 C) 97.6 F (36.4 C) 98 F (36.7 C)  TempSrc: Oral Oral Oral Oral  Resp: '16 16 18 18  ' Height:      Weight: 76.4 kg (168 lb 6.9 oz)     SpO2: 98% 98% 97% 99%    Last BM Date: 06/21/15  Intake/Output   Yesterday:  03/03 0701 - 03/04 0700 In: 170 [P.O.:120; IV Piggyback:50] Out: 300 [Urine:300] This shift:     Bowel function:  Flatus: y  BM: x1  Drain: n/a  Physical Exam:  General: Pt awake/alert/oriented x4 in no acute distress Eyes: PERRL, normal EOM.  Sclera clear.  No icterus Neuro: CN II-XII intact w/o focal sensory/motor deficits. Lymph: No head/neck/groin lymphadenopathy Psych:  No delerium/psychosis/paranoia HENT: Normocephalic, Mucus membranes moist.  No thrush Neck: Supple, No tracheal deviation Chest: No chest wall pain w good excursion CV:  Pulses intact.  Regular rhythm MS: Normal AROM mjr joints.  No obvious deformity Abdomen: Soft.  Obese Nondistended.  Nontender today.  No evidence of peritonitis.  No incarcerated hernias. Ext:  SCDs BLE.  No mjr edema.  No cyanosis Skin: No petechiae / purpura  Results:   Labs: Results for orders placed or performed during the hospital encounter of 06/19/15 (from the  past 48 hour(s))  Glucose, capillary     Status: Abnormal   Collection Time: 06/20/15  8:53 AM  Result Value Ref Range   Glucose-Capillary 150 (H) 65 - 99 mg/dL  Glucose, capillary     Status: Abnormal   Collection Time: 06/20/15 11:50 AM  Result Value Ref Range   Glucose-Capillary 131 (H) 65 - 99 mg/dL  Glucose,  capillary     Status: Abnormal   Collection Time: 06/20/15  4:15 PM  Result Value Ref Range   Glucose-Capillary 118 (H) 65 - 99 mg/dL  Glucose, capillary     Status: Abnormal   Collection Time: 06/20/15  7:25 PM  Result Value Ref Range   Glucose-Capillary 115 (H) 65 - 99 mg/dL  Urinalysis, Routine w reflex microscopic (not at [facility])     Status: Abnormal   Collection Time: 06/20/15  9:09 PM  Result Value Ref Range   Color, Urine AMBER (A) YELLOW    Comment: BIOCHEMICALS MAY BE AFFECTED BY COLOR   APPearance CLOUDY (A) CLEAR   Specific Gravity, Urine 1.034 (H) 1.005 - 1.030   pH 7.0 5.0 - 8.0   Glucose, UA NEGATIVE NEGATIVE mg/dL   Hgb urine dipstick NEGATIVE NEGATIVE   Bilirubin Urine SMALL (A) NEGATIVE   Ketones, ur NEGATIVE NEGATIVE mg/dL   Protein, ur NEGATIVE NEGATIVE mg/dL   Nitrite NEGATIVE NEGATIVE   Leukocytes, UA NEGATIVE NEGATIVE    Comment: MICROSCOPIC NOT DONE ON URINES WITH NEGATIVE PROTEIN, BLOOD, LEUKOCYTES, NITRITE, OR GLUCOSE <1000 mg/dL.  Glucose, capillary     Status: Abnormal   Collection Time: 06/20/15 11:49 PM  Result Value Ref Range   Glucose-Capillary 109 (H) 65 - 99 mg/dL  Glucose, capillary     Status: Abnormal   Collection Time: 06/21/15  4:06 AM  Result Value Ref Range   Glucose-Capillary 103 (H) 65 - 99 mg/dL  Comprehensive metabolic panel     Status: Abnormal   Collection Time: 06/21/15  4:38 AM  Result Value Ref Range   Sodium 139 135 - 145 mmol/L   Potassium 3.4 (L) 3.5 - 5.1 mmol/L   Chloride 108 101 - 111 mmol/L   CO2 18 (L) 22 - 32 mmol/L   Glucose, Bld 112 (H) 65 - 99 mg/dL   BUN 17 6 - 20 mg/dL   Creatinine, Ser 1.06 0.61 - 1.24 mg/dL   Calcium 8.2 (L) 8.9 - 10.3 mg/dL   Total Protein 5.9 (L) 6.5 - 8.1 g/dL   Albumin 3.0 (L) 3.5 - 5.0 g/dL   AST 68 (H) 15 - 41 U/L   ALT 166 (H) 17 - 63 U/L   Alkaline Phosphatase 58 38 - 126 U/L   Total Bilirubin 1.4 (H) 0.3 - 1.2 mg/dL   GFR calc non Af Amer >60 >60 mL/min   GFR calc Af Amer >60 >60  mL/min    Comment: (NOTE) The eGFR has been calculated using the CKD EPI equation. This calculation has not been validated in all clinical situations. eGFR's persistently <60 mL/min signify possible Chronic Kidney Disease.    Anion gap 13 5 - 15  Lipase, blood     Status: Abnormal   Collection Time: 06/21/15  4:38 AM  Result Value Ref Range   Lipase 347 (H) 11 - 51 U/L  Glucose, capillary     Status: Abnormal   Collection Time: 06/21/15  8:15 AM  Result Value Ref Range   Glucose-Capillary 103 (H) 65 - 99 mg/dL  Comprehensive metabolic panel  Status: Abnormal   Collection Time: 06/22/15  5:21 AM  Result Value Ref Range   Sodium 141 135 - 145 mmol/L   Potassium 3.8 3.5 - 5.1 mmol/L   Chloride 108 101 - 111 mmol/L   CO2 20 (L) 22 - 32 mmol/L   Glucose, Bld 118 (H) 65 - 99 mg/dL   BUN 13 6 - 20 mg/dL   Creatinine, Ser 1.07 0.61 - 1.24 mg/dL   Calcium 8.9 8.9 - 10.3 mg/dL   Total Protein 6.8 6.5 - 8.1 g/dL   Albumin 3.5 3.5 - 5.0 g/dL   AST 53 (H) 15 - 41 U/L   ALT 136 (H) 17 - 63 U/L   Alkaline Phosphatase 67 38 - 126 U/L   Total Bilirubin 1.5 (H) 0.3 - 1.2 mg/dL   GFR calc non Af Amer >60 >60 mL/min   GFR calc Af Amer >60 >60 mL/min    Comment: (NOTE) The eGFR has been calculated using the CKD EPI equation. This calculation has not been validated in all clinical situations. eGFR's persistently <60 mL/min signify possible Chronic Kidney Disease.    Anion gap 13 5 - 15  Lipase, blood     Status: Abnormal   Collection Time: 06/22/15  5:21 AM  Result Value Ref Range   Lipase 207 (H) 11 - 51 U/L  CBC     Status: Abnormal   Collection Time: 06/22/15  5:21 AM  Result Value Ref Range   WBC 6.2 4.0 - 10.5 K/uL   RBC 3.96 (L) 4.22 - 5.81 MIL/uL   Hemoglobin 11.6 (L) 13.0 - 17.0 g/dL   HCT 35.1 (L) 39.0 - 52.0 %   MCV 88.6 78.0 - 100.0 fL   MCH 29.3 26.0 - 34.0 pg   MCHC 33.0 30.0 - 36.0 g/dL   RDW 13.8 11.5 - 15.5 %   Platelets 418 (H) 150 - 400 K/uL    Imaging /  Studies: Mr 3d Recon At Scanner  07-15-2015  CLINICAL DATA:  70 year old male with fullness noted in the pancreatic head and surrounding fat stranding on recent CT examination, concerning for potential pancreatitis. Followup study. EXAM: MRI ABDOMEN WITHOUT AND WITH CONTRAST (INCLUDING MRCP) TECHNIQUE: Multiplanar multisequence MR imaging of the abdomen was performed both before and after the administration of intravenous contrast. Heavily T2-weighted images of the biliary and pancreatic ducts were obtained, and three-dimensional MRCP images were rendered by post processing. CONTRAST:  37m MULTIHANCE GADOBENATE DIMEGLUMINE 529 MG/ML IV SOLN COMPARISON:  No prior abdominal MRI. CT the abdomen and pelvis 06/19/2015. FINDINGS: Lower chest:  Unremarkable. Hepatobiliary: There are mild diffuse loss of signal intensity throughout the hepatic parenchyma on out of phase dual echo images, compatible with a background of very mild hepatic steatosis. Sub cm lesion in the periphery of segment 4A of the liver is T1 hypointense, T2 hyperintense, and does not enhance, compatible with a tiny cyst or biliary hamartoma. No other larger more suspicious hepatic lesions are noted. MRCP images demonstrate no intra or extrahepatic biliary ductal dilatation. Common bile duct measures up to 5 mm in the porta hepatis. There are some tiny filling defects lying dependently in the gallbladder, compatible with tiny gallstones. Gallbladder is not distended. Gallbladder wall thickness is normal. No pericholecystic fluid. No filling defects in the common bile duct to suggest choledocholithiasis. Pancreas: No pancreatic mass. The area of perceived fullness in the pancreatic head on the recent CT examination appears to represent normal pancreatic parenchyma. No pancreatic ductal dilatation on MRCP  images. No pancreatic or peripancreatic fluid or inflammatory changes. Pancreatic parenchyma is enhancing normally at this time. Spleen: Unremarkable.  Adrenals/Urinary Tract: Bilateral kidneys and bilateral adrenal glands are normal in appearance. No hydroureteronephrosis in the visualized abdomen. Stomach/Bowel: Visualized portions are unremarkable. Specifically, duodenum is grossly normal in appearance on today's examination. Vascular/Lymphatic: Atherosclerotic disease throughout the visualized abdominal vasculature, without aneurysm. No lymphadenopathy noted in the abdomen. Other: No significant volume of ascites in the visualized peritoneal cavity. Musculoskeletal: No aggressive osseous lesions are noted in the visualized portions of the skeleton. IMPRESSION: 1. No pancreatic mass identified. 2. No overt imaging findings of pancreatitis at this time (despite the patient's elevated lipase levels). Importantly, the pancreatic parenchyma also enhances normally at this time. 3. Cholelithiasis without evidence of acute cholecystitis at this time. 4. No choledocholithiasis or findings to suggest biliary tract obstruction. 5. Mild hepatic steatosis. Electronically Signed   By: Vinnie Langton M.D.   On: 06/20/2015 08:57   Mr Jeananne Rama W/wo Cm/mrcp  06/20/2015  CLINICAL DATA:  70 year old male with fullness noted in the pancreatic head and surrounding fat stranding on recent CT examination, concerning for potential pancreatitis. Followup study. EXAM: MRI ABDOMEN WITHOUT AND WITH CONTRAST (INCLUDING MRCP) TECHNIQUE: Multiplanar multisequence MR imaging of the abdomen was performed both before and after the administration of intravenous contrast. Heavily T2-weighted images of the biliary and pancreatic ducts were obtained, and three-dimensional MRCP images were rendered by post processing. CONTRAST:  39m MULTIHANCE GADOBENATE DIMEGLUMINE 529 MG/ML IV SOLN COMPARISON:  No prior abdominal MRI. CT the abdomen and pelvis 06/19/2015. FINDINGS: Lower chest:  Unremarkable. Hepatobiliary: There are mild diffuse loss of signal intensity throughout the hepatic parenchyma on out of  phase dual echo images, compatible with a background of very mild hepatic steatosis. Sub cm lesion in the periphery of segment 4A of the liver is T1 hypointense, T2 hyperintense, and does not enhance, compatible with a tiny cyst or biliary hamartoma. No other larger more suspicious hepatic lesions are noted. MRCP images demonstrate no intra or extrahepatic biliary ductal dilatation. Common bile duct measures up to 5 mm in the porta hepatis. There are some tiny filling defects lying dependently in the gallbladder, compatible with tiny gallstones. Gallbladder is not distended. Gallbladder wall thickness is normal. No pericholecystic fluid. No filling defects in the common bile duct to suggest choledocholithiasis. Pancreas: No pancreatic mass. The area of perceived fullness in the pancreatic head on the recent CT examination appears to represent normal pancreatic parenchyma. No pancreatic ductal dilatation on MRCP images. No pancreatic or peripancreatic fluid or inflammatory changes. Pancreatic parenchyma is enhancing normally at this time. Spleen: Unremarkable. Adrenals/Urinary Tract: Bilateral kidneys and bilateral adrenal glands are normal in appearance. No hydroureteronephrosis in the visualized abdomen. Stomach/Bowel: Visualized portions are unremarkable. Specifically, duodenum is grossly normal in appearance on today's examination. Vascular/Lymphatic: Atherosclerotic disease throughout the visualized abdominal vasculature, without aneurysm. No lymphadenopathy noted in the abdomen. Other: No significant volume of ascites in the visualized peritoneal cavity. Musculoskeletal: No aggressive osseous lesions are noted in the visualized portions of the skeleton. IMPRESSION: 1. No pancreatic mass identified. 2. No overt imaging findings of pancreatitis at this time (despite the patient's elevated lipase levels). Importantly, the pancreatic parenchyma also enhances normally at this time. 3. Cholelithiasis without evidence  of acute cholecystitis at this time. 4. No choledocholithiasis or findings to suggest biliary tract obstruction. 5. Mild hepatic steatosis. Electronically Signed   By: DVinnie LangtonM.D.   On: 06/20/2015 08:57    Medications /  Allergies: per chart  Antibiotics: Anti-infectives    Start     Dose/Rate Route Frequency Ordered Stop   06/20/15 1400  piperacillin-tazobactam (ZOSYN) IVPB 3.375 g     3.375 g 12.5 mL/hr over 240 Minutes Intravenous 3 times per day 06/20/15 0638     06/20/15 0645  piperacillin-tazobactam (ZOSYN) IVPB 3.375 g     3.375 g 12.5 mL/hr over 240 Minutes Intravenous NOW 06/20/15 5189 06/20/15 1120        Note: Portions of this report may have been transcribed using voice recognition software. Every effort was made to ensure accuracy; however, inadvertent computerized transcription errors may be present.   Any transcriptional errors that result from this process are unintentional.     Adin Hector, M.D., F.A.C.S. Gastrointestinal and Minimally Invasive Surgery [facility] [address], [place],  [address] [phone] Main / Paging   06/22/2015  CARE TEAM:  PCP: Redge Gainer, MD  Outpatient Care Team: Patient Care Team: Chipper Herb, MD as PCP - General (Family Medicine) Clarene Essex, MD (Gastroenterology)  Inpatient Treatment Team: Treatment Team: Attending Provider: Verlee Monte, MD; Rounding Team: Bethann Berkshire, MD; Technician: Coralie Carpen, NT; Consulting Physician: Laurence Spates, MD; Technician: Sueanne Margarita, NT; Consulting Physician: Nolon Nations, MD; Registered Nurse: Bailey Mech, RN; Technician: Tenna Child, [place]; Technician: Abbe Amsterdam, NT; Registered Nurse: Marcy Salvo, RN

## 2015-06-22 NOTE — Discharge Instructions (Signed)
LAPAROSCOPIC SURGERY: POST OP INSTRUCTIONS ° °1. DIET: Follow a light bland diet the first 24 hours after arrival home, such as soup, liquids, crackers, etc.  Be sure to include lots of fluids daily.  Avoid fast food or heavy meals as your are more likely to get nauseated.  Eat a low fat the next few days after surgery.   °2. Take your usually prescribed home medications unless otherwise directed. °3. PAIN CONTROL: °a. Pain is best controlled by a usual combination of three different methods TOGETHER: °i. Ice/Heat °ii. Over the counter pain medication °iii. Prescription pain medication °b. Most patients will experience some swelling and bruising around the incisions.  Ice packs or heating pads (30-60 minutes up to 6 times a day) will help. Use ice for the first few days to help decrease swelling and bruising, then switch to heat to help relax tight/sore spots and speed recovery.  Some people prefer to use ice alone, heat alone, alternating between ice & heat.  Experiment to what works for you.  Swelling and bruising can take several weeks to resolve.   °c. It is helpful to take an over-the-counter pain medication regularly for the first few weeks.  Choose one of the following that works best for you: °i. Naproxen (Aleve, etc)  Two 220mg tabs twice a day °ii. Ibuprofen (Advil, etc) Three 200mg tabs four times a day (every meal & bedtime) °iii. Acetaminophen (Tylenol, etc) 500-650mg four times a day (every meal & bedtime) °d. A  prescription for pain medication (such as oxycodone, hydrocodone, etc) should be given to you upon discharge.  Take your pain medication as prescribed.  °i. If you are having problems/concerns with the prescription medicine (does not control pain, nausea, vomiting, rash, itching, etc), please call us [phone] to see if we need to switch you to a different pain medicine that will work better for you and/or control your side effect better. °ii. If you need a refill on your pain medication,  please contact your pharmacy.  They will contact our office to request authorization. Prescriptions will not be filled after 5 pm or on week-ends. °4. Avoid getting constipated.  Between the surgery and the pain medications, it is common to experience some constipation.  Increasing fluid intake and taking a fiber supplement (such as Metamucil, Citrucel, FiberCon, MiraLax, etc) 1-2 times a day regularly will usually help prevent this problem from occurring.  A mild laxative (prune juice, Milk of Magnesia, MiraLax, etc) should be taken according to package directions if there are no bowel movements after 48 hours.   °5. Watch out for diarrhea.  If you have many loose bowel movements, simplify your diet to bland foods & liquids for a few days.  Stop any stool softeners and decrease your fiber supplement.  Switching to mild anti-diarrheal medications (Kayopectate, Pepto Bismol) can help.  If this worsens or does not improve, please call us. °6. Wash / shower every day.  You may shower over the dressings as they are waterproof.  Continue to shower over incision(s) after the dressing is off. °7. Remove your waterproof bandages 5 days after surgery.  You may leave the incision open to air.  You may replace a dressing/Band-Aid to cover the incision for comfort if you wish.  °8. ACTIVITIES as tolerated:   °a. You may resume regular (light) daily activities beginning the next day--such as daily self-care, walking, climbing stairs--gradually increasing activities as tolerated.  If you can walk 30 minutes without difficulty, it   is safe to try more intense activity such as jogging, treadmill, bicycling, low-impact aerobics, swimming, etc. °b. Save the most intensive and strenuous activity for last such as sit-ups, heavy lifting, contact sports, etc  Refrain from any heavy lifting or straining until you are off narcotics for pain control.   °c. DO NOT PUSH THROUGH PAIN.  Let pain be your guide: If it hurts to do something, don't  do it.  Pain is your body warning you to avoid that activity for another week until the pain goes down. °d. You may drive when you are no longer taking prescription pain medication, you can comfortably wear a seatbelt, and you can safely maneuver your car and apply brakes. °e. You may have sexual intercourse when it is comfortable.  °9. FOLLOW UP in our office °a. Please call [facility] at [phone] to set up an appointment to see your surgeon in the office for a follow-up appointment approximately 2-3 weeks after your surgery. °b. Make sure that you call for this appointment the day you arrive home to insure a convenient appointment time. °10. IF YOU HAVE DISABILITY OR FAMILY LEAVE FORMS, BRING THEM TO THE OFFICE FOR PROCESSING.  DO NOT GIVE THEM TO YOUR DOCTOR. ° ° °WHEN TO CALL US [phone]: °1. Poor pain control °2. Reactions / problems with new medications (rash/itching, nausea, etc)  °3. Fever over 101.5 F (38.5 C) °4. Inability to urinate °5. Nausea and/or vomiting °6. Worsening swelling or bruising °7. Continued bleeding from incision. °8. Increased pain, redness, or drainage from the incision ° ° The clinic staff is available to answer your questions during regular business hours (8:30am-5pm).  Please don’t hesitate to call and ask to speak to one of our nurses for clinical concerns.  ° If you have a medical emergency, go to the nearest emergency room or call 911. ° A surgeon from [facility] is always on call at the hospitals ° ° °[facility] °[address], [address], [place], [place]  [address] ? °MAIN: [phone] ? TOLL FREE: [phone] ?  °FAX [phone] °[email or web address] ° °Cholecystitis °Cholecystitis is inflammation of the gallbladder. It is often called a gallbladder attack. The gallbladder is a pear-shaped organ that lies beneath the liver on the right side of the body. The gallbladder stores bile, which is a fluid that helps the body  to digest fats. If bile builds up in your gallbladder, your gallbladder becomes inflamed. This condition may occur suddenly (be acute). Repeat episodes of acute cholecystitis or prolonged episodes may lead to a long-term (chronic) condition. Cholecystitis is serious and it requires treatment.  °CAUSES °The most common cause of this condition is gallstones. Gallstones can block the tube (duct) that carries bile out of your gallbladder. This causes bile to build up. Other causes of this condition include: °· Damage to the gallbladder due to a decrease in blood flow. °· Infections in the bile ducts. °· Scars or kinks in the bile ducts. °· Tumors in the liver, pancreas, or gallbladder. °RISK FACTORS °This condition is more likely to develop in: °· People who have sickle cell disease. °· People who take birth control pills or use estrogen. °· People who have alcoholic liver disease. °· People who have liver cirrhosis. °· People who have their nutrition delivered through a vein (parenteral nutrition). °· People who do not eat or drink (do fasting) for a long period of time. °· People who are obese. °· People who have   rapid weight loss.  People who are pregnant.  People who have increased triglyceride levels.  People who have pancreatitis. SYMPTOMS Symptoms of this condition include:  Abdominal pain, especially in the upper right area of the abdomen.  Abdominal tenderness or bloating.  Nausea.  Vomiting.  Fever.  Chills.  Yellowing of the skin and the whites of the eyes (jaundice). DIAGNOSIS This condition is diagnosed with a medical history and physical exam. You may also have other tests, including:  Imaging tests, such as:  An ultrasound of the gallbladder.  A CT scan of the abdomen.  A gallbladder nuclear scan (HIDA scan). This scan allows your health care provider to see the bile moving from your liver to your gallbladder and to your small intestine.  MRI.  Blood tests, such  as:  A complete blood count, because the white blood cell count may be higher than normal.  Liver function tests, because some levels may be higher than normal with certain types of gallstones. TREATMENT Treatment may include:  Fasting for a certain amount of time.  IV fluids.  Medicine to treat pain or vomiting.  Antibiotic medicine.  Surgery to remove your gallbladder (cholecystectomy). This may happen immediately or at a later time. Stella care will depend on your treatment. In general:  Take over-the-counter and prescription medicines only as told by your health care provider.  If you were prescribed an antibiotic medicine, take it as told by your health care provider. Do not stop taking the antibiotic even if you start to feel better.  Follow instructions from your health care provider about what to eat or drink. When you are allowed to eat, avoid eating or drinking anything that triggers your symptoms.  Keep all follow-up visits as told by your health care provider. This is important. SEEK MEDICAL CARE IF:  Your pain is not controlled with medicine.  You have a fever. SEEK IMMEDIATE MEDICAL CARE IF:  Your pain moves to another part of your abdomen or to your back.  You continue to have symptoms or you develop new symptoms even with treatment.   This information is not intended to replace advice given to you by your health care provider. Make sure you discuss any questions you have with your health care provider.   Document Released: 04/06/2005 Document Revised: 12/26/2014 Document Reviewed: 07/18/2014 Elsevier Interactive Patient Education [employer].  .Acute Pancreatitis Acute pancreatitis is a disease in which the pancreas becomes suddenly inflamed. The pancreas is a large gland located behind your stomach. The pancreas produces enzymes that help digest food. The pancreas also releases the hormones glucagon and insulin that help  regulate blood sugar. Damage to the pancreas occurs when the digestive enzymes from the pancreas are activated and begin attacking the pancreas before being released into the intestine. Most acute attacks last a couple of days and can cause serious complications. Some people become dehydrated and develop low blood pressure. In severe cases, bleeding into the pancreas can lead to shock and can be life-threatening. The lungs, heart, and kidneys may fail. CAUSES  Pancreatitis can happen to anyone. In some cases, the cause is unknown. Most cases are caused by:  Alcohol abuse.  Gallstones. Other less common causes are:  Certain medicines.  Exposure to certain chemicals.  Infection.  Damage caused by an accident (trauma).  Abdominal surgery. SYMPTOMS   Pain in the upper abdomen that may radiate to the back.  Tenderness and swelling of the abdomen.  Nausea  and vomiting. DIAGNOSIS  Your caregiver will perform a physical exam. Blood and stool tests may be done to confirm the diagnosis. Imaging tests may also be done, such as X-rays, CT scans, or an ultrasound of the abdomen. TREATMENT  Treatment usually requires a stay in the hospital. Treatment may include:  Pain medicine.  Fluid replacement through an intravenous line (IV).  Placing a tube in the stomach to remove stomach contents and control vomiting.  Not eating for 3 or 4 days. This gives your pancreas a rest, because enzymes are not being produced that can cause further damage.  Antibiotic medicines if your condition is caused by an infection.  Surgery of the pancreas or gallbladder. HOME CARE INSTRUCTIONS   Follow the diet advised by your caregiver. This may involve avoiding alcohol and decreasing the amount of fat in your diet.  Eat smaller, more frequent meals. This reduces the amount of digestive juices the pancreas produces.  Drink enough fluids to keep your urine clear or pale yellow.  Only take over-the-counter or  prescription medicines as directed by your caregiver.  Avoid drinking alcohol if it caused your condition.  Do not smoke.  Get plenty of rest.  Check your blood sugar at home as directed by your caregiver.  Keep all follow-up appointments as directed by your caregiver. SEEK MEDICAL CARE IF:   You do not recover as quickly as expected.  You develop new or worsening symptoms.  You have persistent pain, weakness, or nausea.  You recover and then have another episode of pain. SEEK IMMEDIATE MEDICAL CARE IF:   You are unable to eat or keep fluids down.  Your pain becomes severe.  You have a fever or persistent symptoms for more than 2 to 3 days.  You have a fever and your symptoms suddenly get worse.  Your skin or the white part of your eyes turn yellow (jaundice).  You develop vomiting.  You feel dizzy, or you faint.  Your blood sugar is high (over 300 mg/dL). MAKE SURE YOU:   Understand these instructions.  Will watch your condition.  Will get help right away if you are not doing well or get worse.   This information is not intended to replace advice given to you by your health care provider. Make sure you discuss any questions you have with your health care provider.   Document Released: 04/06/2005 Document Revised: 10/06/2011 Document Reviewed: 07/16/2011 Elsevier Interactive Patient Education 2016 [employer].  Managing Pain  Pain after surgery or related to activity is often due to strain/injury to muscle, tendon, nerves and/or incisions.  This pain is usually short-term and will improve in a few months.   Many people find it helpful to do the following things TOGETHER to help speed the process of healing and to get back to regular activity more quickly:  1. Avoid heavy physical activity at first a. No lifting greater than 20 pounds at first, then increase to lifting as tolerated over the next few weeks b. Do not push through the pain.  Listen to your  body and avoid positions and maneuvers than reproduce the pain.  Wait a few days before trying something more intense c. Walking is okay as tolerated, but go slowly and stop when getting sore.  If you can walk 30 minutes without stopping or pain, you can try more intense activity (running, jogging, aerobics, cycling, swimming, treadmill, sex, sports, weightlifting, etc ) d. Remember: If it hurts to do it, then dont do  it!  2. Take Anti-inflammatory medication a. Choose ONE of the following over-the-counter medications: i.            Acetaminophen 500mg  tabs (Tylenol) 1-2 pills with every meal and just before bedtime (avoid if you have liver problems) ii.            Naproxen 220mg  tabs (ex. Aleve) 1-2 pills twice a day (avoid if you have kidney, stomach, IBD, or bleeding problems) iii. Ibuprofen 200mg  tabs (ex. Advil, Motrin) 3-4 pills with every meal and just before bedtime (avoid if you have kidney, stomach, IBD, or bleeding problems) b. Take with food/snack around the clock for 1-2 weeks i. This helps the muscle and nerve tissues become less irritable and calm down faster  3. Use a Heating pad or Ice/Cold Pack a. 4-6 times a day b. May use warm bath/hottub  or showers  4. Try Gentle Massage and/or Stretching  a. at the area of pain many times a day b. stop if you feel pain - do not overdo it  Try these steps together to help you body heal faster and avoid making things get worse.  Doing just one of these things may not be enough.    If you are not getting better after two weeks or are noticing you are getting worse, contact our office for further advice; we may need to re-evaluate you & see what other things we can do to help.  GETTING TO GOOD BOWEL HEALTH. Irregular bowel habits such as constipation and diarrhea can lead to many problems over time.  Having one soft bowel movement a day is the most important way to prevent further problems.  The anorectal canal is designed to handle  stretching and feces to safely manage our ability to get rid of solid waste (feces, poop, stool) out of our body.  BUT, hard constipated stools can act like ripping concrete bricks and diarrhea can be a burning fire to this very sensitive area of our body, causing inflamed hemorrhoids, anal fissures, increasing risk is perirectal abscesses, abdominal pain/bloating, an making irritable bowel worse.      The goal: ONE SOFT BOWEL MOVEMENT A DAY!  To have soft, regular bowel movements:   Drink plenty of fluids, consider 4-6 tall glasses of water a day.    Take plenty of fiber.  Fiber is the undigested part of plant food that passes into the colon, acting s natures broom to encourage bowel motility and movement.  Fiber can absorb and hold large amounts of water. This results in a larger, bulkier stool, which is soft and easier to pass. Work gradually over several weeks up to 6 servings a day of fiber (25g a day even more if needed) in the form of: o Vegetables -- Root (potatoes, carrots, turnips), leafy green (lettuce, salad greens, celery, spinach), or cooked high residue (cabbage, broccoli, etc) o Fruit -- Fresh (unpeeled skin & pulp), Dried (prunes, apricots, cherries, etc ),  or stewed ( applesauce)  o Whole grain breads, pasta, etc (whole wheat)  o Bran cereals   Bulking Agents -- This type of water-retaining fiber generally is easily obtained each day by one of the following:  o Psyllium bran -- The psyllium plant is remarkable because its ground seeds can retain so much water. This product is available as Metamucil, Konsyl, Effersyllium, Per Diem Fiber, or the less expensive generic preparation in drug and health food stores. Although labeled a laxative, it really is not a laxative.  o  Methylcellulose -- This is another fiber derived from wood which also retains water. It is available as Citrucel. o Polyethylene Glycol - and artificial fiber commonly called Miralax or Glycolax.  It is helpful  for people with gassy or bloated feelings with regular fiber o Flax Seed - a less gassy fiber than psyllium  No reading or other relaxing activity while on the toilet. If bowel movements take longer than 5 minutes, you are too constipated  AVOID CONSTIPATION.  High fiber and water intake usually takes care of this.  Sometimes a laxative is needed to stimulate more frequent bowel movements, but   Laxatives are not a good long-term solution as it can wear the colon out.  They can help jump-start bowels if constipated, but should be relied on constantly without discussing with your doctor o Osmotics (Milk of Magnesia, Fleets phosphosoda, Magnesium citrate, MiraLax, GoLytely) are safer than  o Stimulants (Senokot, Castor Oil, Dulcolax, Ex Lax)    o Avoid taking laxatives for more than 7 days in a row.   IF SEVERELY CONSTIPATED, try a Bowel Retraining Program: o Do not use laxatives.  o Eat a diet high in roughage, such as bran cereals and leafy vegetables.  o Drink six (6) ounces of prune or apricot juice each morning.  o Eat two (2) large servings of stewed fruit each day.  o Take one (1) heaping tablespoon of a psyllium-based bulking agent twice a day. Use sugar-free sweetener when possible to avoid excessive calories.  o Eat a normal breakfast.  o Set aside 15 minutes after breakfast to sit on the toilet, but do not strain to have a bowel movement.  o If you do not have a bowel movement by the third day, use an enema and repeat the above steps.   Controlling diarrhea o Switch to liquids and simpler foods for a few days to avoid stressing your intestines further. o Avoid dairy products (especially milk & ice cream) for a short time.  The intestines often can lose the ability to digest lactose when stressed. o Avoid foods that cause gassiness or bloating.  Typical foods include beans and other legumes, cabbage, broccoli, and dairy foods.  Every person has some sensitivity to other foods, so  listen to our body and avoid those foods that trigger problems for you. o Adding fiber (Citrucel, Metamucil, psyllium, Miralax) gradually can help thicken stools by absorbing excess fluid and retrain the intestines to act more normally.  Slowly increase the dose over a few weeks.  Too much fiber too soon can backfire and cause cramping & bloating. o Probiotics (such as active yogurt, Align, etc) may help repopulate the intestines and colon with normal bacteria and calm down a sensitive digestive tract.  Most studies show it to be of mild help, though, and such products can be costly. o Medicines: - Bismuth subsalicylate (ex. Kayopectate, Pepto Bismol) every 30 minutes for up to 6 doses can help control diarrhea.  Avoid if pregnant. - Loperamide (Immodium) can slow down diarrhea.  Start with two tablets (4mg  total) first and then try one tablet every 6 hours.  Avoid if you are having fevers or severe pain.  If you are not better or start feeling worse, stop all medicines and call your doctor for advice o Call your doctor if you are getting worse or not better.  Sometimes further testing (cultures, endoscopy, X-ray studies, bloodwork, etc) may be needed to help diagnose and treat the cause of the diarrhea.  TROUBLESHOOTING IRREGULAR BOWELS 1) Avoid extremes of bowel movements (no bad constipation/diarrhea) 2) Miralax 17gm mixed in 8oz. water or juice-daily. May use BID as needed.  3) Gas-x,Phazyme, etc. as needed for gas & bloating.  4) Soft,bland diet. No spicy,greasy,fried foods.  5) Prilosec over-the-counter as needed  6) May hold gluten/wheat products from diet to see if symptoms improve.  7)  May try probiotics (Align, Activa, etc) to help calm the bowels down 7) If symptoms become worse call back immediately.

## 2015-06-22 NOTE — Care Management Important Message (Signed)
Important Message  Patient Details  Name: Matthew Daugherty MRN: [id number] Date of Birth: 05-03-1945   Medicare Important Message Given:  Yes    Erenest Rasher, RN 06/22/2015, 1:43 PM

## 2015-06-23 LAB — COMPREHENSIVE METABOLIC PANEL
ALK PHOS: 52 U/L (ref 38–126)
ALT: 111 U/L — ABNORMAL HIGH (ref 17–63)
ANION GAP: 10 (ref 5–15)
AST: 63 U/L — ABNORMAL HIGH (ref 15–41)
Albumin: 3.1 g/dL — ABNORMAL LOW (ref 3.5–5.0)
BILIRUBIN TOTAL: 1.5 mg/dL — AB (ref 0.3–1.2)
BUN: 10 mg/dL (ref 6–20)
CALCIUM: 8.5 mg/dL — AB (ref 8.9–10.3)
CO2: 23 mmol/L (ref 22–32)
Chloride: 107 mmol/L (ref 101–111)
Creatinine, Ser: 1.07 mg/dL (ref 0.61–1.24)
Glucose, Bld: 130 mg/dL — ABNORMAL HIGH (ref 65–99)
Potassium: 3.8 mmol/L (ref 3.5–5.1)
SODIUM: 140 mmol/L (ref 135–145)
TOTAL PROTEIN: 6 g/dL — AB (ref 6.5–8.1)

## 2015-06-23 LAB — MAGNESIUM: MAGNESIUM: 1.6 mg/dL — AB (ref 1.7–2.4)

## 2015-06-23 LAB — GLUCOSE, CAPILLARY
GLUCOSE-CAPILLARY: 118 mg/dL — AB (ref 65–99)
GLUCOSE-CAPILLARY: 133 mg/dL — AB (ref 65–99)
Glucose-Capillary: 110 mg/dL — ABNORMAL HIGH (ref 65–99)

## 2015-06-23 LAB — HEMOGLOBIN: Hemoglobin: 10.2 g/dL — ABNORMAL LOW (ref 13.0–17.0)

## 2015-06-23 LAB — LIPASE, BLOOD: Lipase: 83 U/L — ABNORMAL HIGH (ref 11–51)

## 2015-06-23 NOTE — Progress Notes (Signed)
[facility] PROGRESS NOTE   Matthew Daugherty [id number] DOB: March 09, 1946 DOA: 06/19/2015 PCP: Redge Gainer, MD  HPI/Subjective: Feels much better this morning, ready to go home. Tolerated full liquids. Follow-up with primary care physician in one week.  Assessment/Plan: Principal Problem:   Gallstone pancreatitis s/p lap cholecystectomy 06/22/2015 Active Problems:   Hyperlipemia   Hypothyroid   Diabetes mellitus type 2, controlled ([place])   Essential hypertension   Pancreatitis   Gallstone pancreatitis Patient has epigastric pain, elevated lipase and CT scan confirming acute pancreatitis. GI consulted, MRCP showed cholelithiasis without choledocholithiasis This is being treated with keeping patient NPO and aggressive hydration with IV fluids. Narcotics for pain. Discharge home by general surgery  Cholelithiasis MRCP showed cholelithiasis without choledocholithiasis. Surprisingly MRCP did not even show evidence of acute pancreatitis even with the elevated lipase. Status post laparoscopic cholecystectomy done on 06/22/2015.   Hypertension Patient has been nothing by mouth, IV hydralazine as needed.  Diabetes mellitus type 2 Place patient on insulin sliding scale.  Hypothyroidism Synthroid.   Code Status: Full Code Family Communication: Plan discussed with the patient. Disposition Plan: Remains inpatient Diet: Diet - low sodium heart healthy Diet full liquid Room service appropriate?: Yes; Fluid consistency:: Thin  Consultants:  GI  Procedures:  None  Antibiotics:  None   Objective: Filed Vitals:   06/23/15 0038 06/23/15 0433  BP: 120/52 123/74  Pulse: 80 85  Temp: 98 F (36.7 C) 98.7 F (37.1 C)  Resp: 18 16    Intake/Output Summary (Last 24 hours) at 06/23/15 1113 Last data filed at 06/23/15 1050  Gross per 24 hour  Intake    103 ml  Output   1125 ml  Net  -1022 ml   Filed Weights   06/20/15 0211 06/21/15 0454 06/23/15 0433  Weight:  74.39 kg (164 lb) 76.4 kg (168 lb 6.9 oz) 80 kg (176 lb 5.9 oz)    Exam: General: Alert and awake, oriented x3, not in any acute distress. HEENT: anicteric sclera, pupils reactive to light and accommodation, EOMI CVS: S1-S2 clear, no murmur rubs or gallops Chest: clear to auscultation bilaterally, no wheezing, rales or rhonchi Abdomen: soft nontender, nondistended, normal bowel sounds, no organomegaly Extremities: no cyanosis, clubbing or edema noted bilaterally Neuro: Cranial nerves II-XII intact, no focal neurological deficits  Data Reviewed: Basic Metabolic Panel:  Recent Labs Lab 06/20/15 0416 06/21/15 0438 06/22/15 0521 06/23/15 0511  NA 134* 139 141 140  K 4.0 3.4* 3.8 3.8  CL 101 108 108 107  CO2 26 18* 20* 23  GLUCOSE 154* 112* 118* 130*  BUN 14 17 13 10   CREATININE 1.18 1.06 1.07 1.07  CALCIUM 8.8* 8.2* 8.9 8.5*  MG  --   --   --  1.6*   Liver Function Tests:  Recent Labs Lab 06/19/15 1716 06/20/15 0416 06/21/15 0438 06/22/15 0521 06/23/15 0511  AST 254* 148* 68* 53* 63*  ALT 367* 259* 166* 136* 111*  ALKPHOS 98 69 58 67 52  BILITOT 1.4* 1.1 1.4* 1.5* 1.5*  PROT 6.5 6.2* 5.9* 6.8 6.0*  ALBUMIN 4.1 3.5 3.0* 3.5 3.1*    Recent Labs Lab 06/19/15 1716 06/20/15 0416 06/21/15 0438 06/22/15 0521 06/23/15 0511  LIPASE 2298* 1618* 347* 207* 83*  AMYLASE 617*  --   --   --   --    No results for input(s): AMMONIA in the last 168 hours. CBC:  Recent Labs Lab 06/19/15 1716 06/20/15 0416 06/22/15 0521 06/23/15 0511  WBC 11.8*  8.1 6.2  --   NEUTROABS 9.9* 5.9  --   --   HGB  --  11.2* 11.6* 10.2*  HCT 37.2* 32.6* 35.1*  --   MCV 89 85.8 88.6  --   PLT 406* 346 418*  --    Cardiac Enzymes: No results for input(s): CKTOTAL, CKMB, CKMBINDEX, TROPONINI in the last 168 hours. BNP (last 3 results) No results for input(s): BNP in the last 8760 hours.  ProBNP (last 3 results) No results for input(s): PROBNP in the last 8760 hours.  CBG:  Recent  Labs Lab 06/22/15 1702 06/22/15 1918 06/23/15 0031 06/23/15 0437 06/23/15 0737  GLUCAP 168* 246* 133* 118* 110*    Micro Recent Results (from the past 240 hour(s))  Fecal occult blood, imunochemical     Status: None   Collection Time: 06/14/15 12:43 PM  Result Value Ref Range Status   Fecal Occult Bld Negative Negative Final     Studies: Dg Cholangiogram Operative  06/22/2015  CLINICAL DATA:  Cholecystitis EXAM: INTRAOPERATIVE CHOLANGIOGRAM TECHNIQUE: Cholangiographic images from the C-arm fluoroscopic device were submitted for interpretation post-operatively. Please see the procedural report for the amount of contrast and the fluoroscopy time utilized. COMPARISON:  MR 06/20/2015 FINDINGS: No persistent filling defects in the common duct. Intrahepatic ducts are incompletely visualized, appearing decompressed centrally. Contrast passes into the duodenum. : Negative for retained common duct stone. Electronically Signed   By: Lucrezia Europe M.D.   On: 06/22/2015 09:21    Scheduled Meds: . antiseptic oral rinse  7 mL Mouth Rinse BID  . aspirin EC  81 mg Oral Daily  . chlorhexidine  1 application Topical Once  . cholecalciferol  1,000 Units Oral Daily  . ezetimibe  10 mg Oral Daily  . insulin aspart  0-9 Units Subcutaneous Q4H  . levothyroxine  75 mcg Oral QAC breakfast  . lip balm  1 application Topical BID  . piperacillin-tazobactam (ZOSYN)  IV  3.375 g Intravenous 3 times per day  . psyllium  1 packet Oral BID  . saccharomyces boulardii  250 mg Oral BID  . sodium chloride flush  3 mL Intravenous Q12H   Continuous Infusions:       Time spent: 35 minutes    [facility]  [facility] Pager [phone] If 7PM-7AM, please contact night-coverage at [email or web address], password [facility] 06/23/2015, 11:13 AM  LOS: 4 days

## 2015-06-23 NOTE — Discharge Summary (Addendum)
Physician Discharge Summary  Patient ID: Matthew Daugherty MRN: [id number] DOB/AGE: 22-Apr-1945 70 y.o.  Admit date: 06/19/2015 Discharge date: 06/23/2015  Patient Care Team: Chipper Herb, MD as PCP - General (Family Medicine) Clarene Essex, MD (Gastroenterology)  Admission Diagnoses: Principal Problem:   Gallstone pancreatitis s/p lap cholecystectomy 06/22/2015 Active Problems:   Hyperlipemia   Hypothyroid   Diabetes mellitus type 2, controlled ([place])   Essential hypertension   Pancreatitis   Discharge Diagnoses:  Principal Problem:   Gallstone pancreatitis s/p lap cholecystectomy 06/22/2015 Active Problems:   Hyperlipemia   Hypothyroid   Diabetes mellitus type 2, controlled ([place])   Essential hypertension   Pancreatitis   06/22/2015  PRE-OPERATIVE DIAGNOSIS: chronic cholecystitis with gallstone pancreatitis  POST-OPERATIVE DIAGNOSIS:   chronic cholecystitis gallstone pancreatitis umbilical hernia  PROCEDURE:  LAPAROSCOPIC CHOLECYSTECTOMY SINGLE SITE WITH INTRAOPERATIVE CHOLANGIOGRAM,  PRIMARY UMBILICAL HERNIA REPAIR  SURGEON: Surgeon(s): Michael Boston, MD  Consults: general surgery and medicine and GI  Hospital Course:   Pleasant elderly active male.  Came in with abdominal pain.  Elevated liver function tests and lipase concerning for gallstone pancreatitis.  Admitted.  Hydrated.  Initially gastroenterology was consulted given elevated test.  MRCP showed no evidence of persistent choledocholithiasis.  Pain and laboratory values normalized.  The patient underwent laparoscopic cholecystectomy.  Cholangiogram revealed no choledocholithiasis.  Postoperatively, the patient gradually mobilized and advanced to a solid diet.  Pain and other symptoms were treated aggressively.  Had some occasional elevated glucoses but final day had good control with glucose 110-130.  Hemoglobin stable.  Liver function tests nearly normalized and markedly improved  By the time of discharge, the  patient was walking well the hallways, eating food, having flatus.  Pain was well-controlled on an oral medications.  Based on meeting discharge criteria and continuing to recover, I felt it was safe for the patient to be discharged from the hospital to further recover with close followup. Postoperative recommendations were discussed in detail with the patient and his wife..  They are written as well.   Significant Diagnostic Studies:  Results for orders placed or performed during the hospital encounter of 06/19/15 (from the past 72 hour(s))  Glucose, capillary     Status: Abnormal   Collection Time: 06/20/15 11:50 AM  Result Value Ref Range   Glucose-Capillary 131 (H) 65 - 99 mg/dL  Glucose, capillary     Status: Abnormal   Collection Time: 06/20/15  4:15 PM  Result Value Ref Range   Glucose-Capillary 118 (H) 65 - 99 mg/dL  Glucose, capillary     Status: Abnormal   Collection Time: 06/20/15  7:25 PM  Result Value Ref Range   Glucose-Capillary 115 (H) 65 - 99 mg/dL  Urinalysis, Routine w reflex microscopic (not at [facility])     Status: Abnormal   Collection Time: 06/20/15  9:09 PM  Result Value Ref Range   Color, Urine AMBER (A) YELLOW    Comment: BIOCHEMICALS MAY BE AFFECTED BY COLOR   APPearance CLOUDY (A) CLEAR   Specific Gravity, Urine 1.034 (H) 1.005 - 1.030   pH 7.0 5.0 - 8.0   Glucose, UA NEGATIVE NEGATIVE mg/dL   Hgb urine dipstick NEGATIVE NEGATIVE   Bilirubin Urine SMALL (A) NEGATIVE   Ketones, ur NEGATIVE NEGATIVE mg/dL   Protein, ur NEGATIVE NEGATIVE mg/dL   Nitrite NEGATIVE NEGATIVE   Leukocytes, UA NEGATIVE NEGATIVE    Comment: MICROSCOPIC NOT DONE ON URINES WITH NEGATIVE PROTEIN, BLOOD, LEUKOCYTES, NITRITE, OR GLUCOSE <1000 mg/dL.  Glucose, capillary  Status: Abnormal   Collection Time: 06/20/15 11:49 PM  Result Value Ref Range   Glucose-Capillary 109 (H) 65 - 99 mg/dL  Glucose, capillary     Status: Abnormal   Collection Time: 06/21/15  4:06 AM  Result Value Ref  Range   Glucose-Capillary 103 (H) 65 - 99 mg/dL  Comprehensive metabolic panel     Status: Abnormal   Collection Time: 06/21/15  4:38 AM  Result Value Ref Range   Sodium 139 135 - 145 mmol/L   Potassium 3.4 (L) 3.5 - 5.1 mmol/L   Chloride 108 101 - 111 mmol/L   CO2 18 (L) 22 - 32 mmol/L   Glucose, Bld 112 (H) 65 - 99 mg/dL   BUN 17 6 - 20 mg/dL   Creatinine, Ser 1.06 0.61 - 1.24 mg/dL   Calcium 8.2 (L) 8.9 - 10.3 mg/dL   Total Protein 5.9 (L) 6.5 - 8.1 g/dL   Albumin 3.0 (L) 3.5 - 5.0 g/dL   AST 68 (H) 15 - 41 U/L   ALT 166 (H) 17 - 63 U/L   Alkaline Phosphatase 58 38 - 126 U/L   Total Bilirubin 1.4 (H) 0.3 - 1.2 mg/dL   GFR calc non Af Amer >60 >60 mL/min   GFR calc Af Amer >60 >60 mL/min    Comment: (NOTE) The eGFR has been calculated using the CKD EPI equation. This calculation has not been validated in all clinical situations. eGFR's persistently <60 mL/min signify possible Chronic Kidney Disease.    Anion gap 13 5 - 15  Lipase, blood     Status: Abnormal   Collection Time: 06/21/15  4:38 AM  Result Value Ref Range   Lipase 347 (H) 11 - 51 U/L  Glucose, capillary     Status: Abnormal   Collection Time: 06/21/15  8:15 AM  Result Value Ref Range   Glucose-Capillary 103 (H) 65 - 99 mg/dL  Comprehensive metabolic panel     Status: Abnormal   Collection Time: 06/22/15  5:21 AM  Result Value Ref Range   Sodium 141 135 - 145 mmol/L   Potassium 3.8 3.5 - 5.1 mmol/L   Chloride 108 101 - 111 mmol/L   CO2 20 (L) 22 - 32 mmol/L   Glucose, Bld 118 (H) 65 - 99 mg/dL   BUN 13 6 - 20 mg/dL   Creatinine, Ser 1.07 0.61 - 1.24 mg/dL   Calcium 8.9 8.9 - 10.3 mg/dL   Total Protein 6.8 6.5 - 8.1 g/dL   Albumin 3.5 3.5 - 5.0 g/dL   AST 53 (H) 15 - 41 U/L   ALT 136 (H) 17 - 63 U/L   Alkaline Phosphatase 67 38 - 126 U/L   Total Bilirubin 1.5 (H) 0.3 - 1.2 mg/dL   GFR calc non Af Amer >60 >60 mL/min   GFR calc Af Amer >60 >60 mL/min    Comment: (NOTE) The eGFR has been calculated  using the CKD EPI equation. This calculation has not been validated in all clinical situations. eGFR's persistently <60 mL/min signify possible Chronic Kidney Disease.    Anion gap 13 5 - 15  Lipase, blood     Status: Abnormal   Collection Time: 06/22/15  5:21 AM  Result Value Ref Range   Lipase 207 (H) 11 - 51 U/L  CBC     Status: Abnormal   Collection Time: 06/22/15  5:21 AM  Result Value Ref Range   WBC 6.2 4.0 - 10.5 K/uL   RBC 3.96 (  L) 4.22 - 5.81 MIL/uL   Hemoglobin 11.6 (L) 13.0 - 17.0 g/dL   HCT 35.1 (L) 39.0 - 52.0 %   MCV 88.6 78.0 - 100.0 fL   MCH 29.3 26.0 - 34.0 pg   MCHC 33.0 30.0 - 36.0 g/dL   RDW 13.8 11.5 - 15.5 %   Platelets 418 (H) 150 - 400 K/uL  Glucose, capillary     Status: Abnormal   Collection Time: 06/22/15 10:02 AM  Result Value Ref Range   Glucose-Capillary 162 (H) 65 - 99 mg/dL  Glucose, capillary     Status: Abnormal   Collection Time: 06/22/15 11:45 AM  Result Value Ref Range   Glucose-Capillary 158 (H) 65 - 99 mg/dL  Glucose, capillary     Status: Abnormal   Collection Time: 06/22/15  5:02 PM  Result Value Ref Range   Glucose-Capillary 168 (H) 65 - 99 mg/dL  Glucose, capillary     Status: Abnormal   Collection Time: 06/22/15  7:18 PM  Result Value Ref Range   Glucose-Capillary 246 (H) 65 - 99 mg/dL  Glucose, capillary     Status: Abnormal   Collection Time: 06/23/15 12:31 AM  Result Value Ref Range   Glucose-Capillary 133 (H) 65 - 99 mg/dL  Glucose, capillary     Status: Abnormal   Collection Time: 06/23/15  4:37 AM  Result Value Ref Range   Glucose-Capillary 118 (H) 65 - 99 mg/dL  Lipase, blood     Status: Abnormal   Collection Time: 06/23/15  5:11 AM  Result Value Ref Range   Lipase 83 (H) 11 - 51 U/L  Hemoglobin     Status: Abnormal   Collection Time: 06/23/15  5:11 AM  Result Value Ref Range   Hemoglobin 10.2 (L) 13.0 - 17.0 g/dL  Comprehensive metabolic panel     Status: Abnormal   Collection Time: 06/23/15  5:11 AM  Result  Value Ref Range   Sodium 140 135 - 145 mmol/L   Potassium 3.8 3.5 - 5.1 mmol/L   Chloride 107 101 - 111 mmol/L   CO2 23 22 - 32 mmol/L   Glucose, Bld 130 (H) 65 - 99 mg/dL   BUN 10 6 - 20 mg/dL   Creatinine, Ser 1.07 0.61 - 1.24 mg/dL   Calcium 8.5 (L) 8.9 - 10.3 mg/dL   Total Protein 6.0 (L) 6.5 - 8.1 g/dL   Albumin 3.1 (L) 3.5 - 5.0 g/dL   AST 63 (H) 15 - 41 U/L   ALT 111 (H) 17 - 63 U/L   Alkaline Phosphatase 52 38 - 126 U/L   Total Bilirubin 1.5 (H) 0.3 - 1.2 mg/dL   GFR calc non Af Amer >60 >60 mL/min   GFR calc Af Amer >60 >60 mL/min    Comment: (NOTE) The eGFR has been calculated using the CKD EPI equation. This calculation has not been validated in all clinical situations. eGFR's persistently <60 mL/min signify possible Chronic Kidney Disease.    Anion gap 10 5 - 15  Magnesium     Status: Abnormal   Collection Time: 06/23/15  5:11 AM  Result Value Ref Range   Magnesium 1.6 (L) 1.7 - 2.4 mg/dL  Glucose, capillary     Status: Abnormal   Collection Time: 06/23/15  7:37 AM  Result Value Ref Range   Glucose-Capillary 110 (H) 65 - 99 mg/dL    Dg Cholangiogram Operative  06/22/2015  CLINICAL DATA:  Cholecystitis EXAM: INTRAOPERATIVE CHOLANGIOGRAM TECHNIQUE: Cholangiographic images from the  C-arm fluoroscopic device were submitted for interpretation post-operatively. Please see the procedural report for the amount of contrast and the fluoroscopy time utilized. COMPARISON:  MR 06/20/2015 FINDINGS: No persistent filling defects in the common duct. Intrahepatic ducts are incompletely visualized, appearing decompressed centrally. Contrast passes into the duodenum. : Negative for retained common duct stone. Electronically Signed   By: Lucrezia Europe M.D.   On: 06/22/2015 09:21   Mr 3d Recon At Scanner  06/20/2015  CLINICAL DATA:  70 year old male with fullness noted in the pancreatic head and surrounding fat stranding on recent CT examination, concerning for potential pancreatitis.  Followup study. EXAM: MRI ABDOMEN WITHOUT AND WITH CONTRAST (INCLUDING MRCP) TECHNIQUE: Multiplanar multisequence MR imaging of the abdomen was performed both before and after the administration of intravenous contrast. Heavily T2-weighted images of the biliary and pancreatic ducts were obtained, and three-dimensional MRCP images were rendered by post processing. CONTRAST:  39m MULTIHANCE GADOBENATE DIMEGLUMINE 529 MG/ML IV SOLN COMPARISON:  No prior abdominal MRI. CT the abdomen and pelvis 06/19/2015. FINDINGS: Lower chest:  Unremarkable. Hepatobiliary: There are mild diffuse loss of signal intensity throughout the hepatic parenchyma on out of phase dual echo images, compatible with a background of very mild hepatic steatosis. Sub cm lesion in the periphery of segment 4A of the liver is T1 hypointense, T2 hyperintense, and does not enhance, compatible with a tiny cyst or biliary hamartoma. No other larger more suspicious hepatic lesions are noted. MRCP images demonstrate no intra or extrahepatic biliary ductal dilatation. Common bile duct measures up to 5 mm in the porta hepatis. There are some tiny filling defects lying dependently in the gallbladder, compatible with tiny gallstones. Gallbladder is not distended. Gallbladder wall thickness is normal. No pericholecystic fluid. No filling defects in the common bile duct to suggest choledocholithiasis. Pancreas: No pancreatic mass. The area of perceived fullness in the pancreatic head on the recent CT examination appears to represent normal pancreatic parenchyma. No pancreatic ductal dilatation on MRCP images. No pancreatic or peripancreatic fluid or inflammatory changes. Pancreatic parenchyma is enhancing normally at this time. Spleen: Unremarkable. Adrenals/Urinary Tract: Bilateral kidneys and bilateral adrenal glands are normal in appearance. No hydroureteronephrosis in the visualized abdomen. Stomach/Bowel: Visualized portions are unremarkable. Specifically,  duodenum is grossly normal in appearance on today's examination. Vascular/Lymphatic: Atherosclerotic disease throughout the visualized abdominal vasculature, without aneurysm. No lymphadenopathy noted in the abdomen. Other: No significant volume of ascites in the visualized peritoneal cavity. Musculoskeletal: No aggressive osseous lesions are noted in the visualized portions of the skeleton. IMPRESSION: 1. No pancreatic mass identified. 2. No overt imaging findings of pancreatitis at this time (despite the patient's elevated lipase levels). Importantly, the pancreatic parenchyma also enhances normally at this time. 3. Cholelithiasis without evidence of acute cholecystitis at this time. 4. No choledocholithiasis or findings to suggest biliary tract obstruction. 5. Mild hepatic steatosis. Electronically Signed   By: DVinnie LangtonM.D.   On: 06/20/2015 08:57   Mr AJeananne RamaW/wo Cm/mrcp  06/20/2015  CLINICAL DATA:  70year old male with fullness noted in the pancreatic head and surrounding fat stranding on recent CT examination, concerning for potential pancreatitis. Followup study. EXAM: MRI ABDOMEN WITHOUT AND WITH CONTRAST (INCLUDING MRCP) TECHNIQUE: Multiplanar multisequence MR imaging of the abdomen was performed both before and after the administration of intravenous contrast. Heavily T2-weighted images of the biliary and pancreatic ducts were obtained, and three-dimensional MRCP images were rendered by post processing. CONTRAST:  178mMULTIHANCE GADOBENATE DIMEGLUMINE 529 MG/ML IV SOLN COMPARISON:  No prior  abdominal MRI. CT the abdomen and pelvis 06/19/2015. FINDINGS: Lower chest:  Unremarkable. Hepatobiliary: There are mild diffuse loss of signal intensity throughout the hepatic parenchyma on out of phase dual echo images, compatible with a background of very mild hepatic steatosis. Sub cm lesion in the periphery of segment 4A of the liver is T1 hypointense, T2 hyperintense, and does not enhance, compatible with  a tiny cyst or biliary hamartoma. No other larger more suspicious hepatic lesions are noted. MRCP images demonstrate no intra or extrahepatic biliary ductal dilatation. Common bile duct measures up to 5 mm in the porta hepatis. There are some tiny filling defects lying dependently in the gallbladder, compatible with tiny gallstones. Gallbladder is not distended. Gallbladder wall thickness is normal. No pericholecystic fluid. No filling defects in the common bile duct to suggest choledocholithiasis. Pancreas: No pancreatic mass. The area of perceived fullness in the pancreatic head on the recent CT examination appears to represent normal pancreatic parenchyma. No pancreatic ductal dilatation on MRCP images. No pancreatic or peripancreatic fluid or inflammatory changes. Pancreatic parenchyma is enhancing normally at this time. Spleen: Unremarkable. Adrenals/Urinary Tract: Bilateral kidneys and bilateral adrenal glands are normal in appearance. No hydroureteronephrosis in the visualized abdomen. Stomach/Bowel: Visualized portions are unremarkable. Specifically, duodenum is grossly normal in appearance on today's examination. Vascular/Lymphatic: Atherosclerotic disease throughout the visualized abdominal vasculature, without aneurysm. No lymphadenopathy noted in the abdomen. Other: No significant volume of ascites in the visualized peritoneal cavity. Musculoskeletal: No aggressive osseous lesions are noted in the visualized portions of the skeleton. IMPRESSION: 1. No pancreatic mass identified. 2. No overt imaging findings of pancreatitis at this time (despite the patient's elevated lipase levels). Importantly, the pancreatic parenchyma also enhances normally at this time. 3. Cholelithiasis without evidence of acute cholecystitis at this time. 4. No choledocholithiasis or findings to suggest biliary tract obstruction. 5. Mild hepatic steatosis. Electronically Signed   By: Vinnie Langton M.D.   On: 06/20/2015 08:57     Discharge Exam: Blood pressure 123/74, pulse 85, temperature 98.7 F (37.1 C), temperature source Oral, resp. rate 16, height 5' 10" (1.778 m), weight 80 kg (176 lb 5.9 oz), SpO2 99 %.  General: Pt awake/alert/oriented x4 in no major acute distress.  Sitting up in chair, smiling. Eyes: PERRL, normal EOM. Sclera nonicteric Neuro: CN II-XII intact w/o focal sensory/motor deficits. Lymph: No head/neck/groin lymphadenopathy Psych:  No delerium/psychosis/paranoia HENT: Normocephalic, Mucus membranes moist.  No thrush Neck: Supple, No tracheal deviation Chest: No pain.  Good respiratory excursion. CV:  Pulses intact.  Regular rhythm MS: Normal AROM mjr joints.  No obvious deformity Abdomen: Soft, Nondistended.  Umbilical dressing clean and dry and intact.  Minimal sensitivity.  No incarcerated hernias. Ext:  SCDs BLE.  No significant edema.  No cyanosis Skin: No petechiae / purpura  Discharged Condition: good   Past Medical History  Diagnosis Date  . Other and unspecified hyperlipidemia   . Essential hypertension, benign   . Unspecified transient cerebral ischemia   . Cardiac dysrhythmia, unspecified   . Hyperplasia of prostate     History reviewed. No pertinent past surgical history.  Social History   Social History  . Marital Status: Married    Spouse Name: N/A  . Number of Children: N/A  . Years of Education: N/A   Occupational History  . Not on file.   Social History Main Topics  . Smoking status: Former Smoker -- 1.00 packs/day    Types: Cigarettes    Start date: 04/20/1954  Quit date: 06/13/1998  . Smokeless tobacco: Not on file  . Alcohol Use: No  . Drug Use: No  . Sexual Activity: Not on file   Other Topics Concern  . Not on file   Social History Narrative    Family History  Problem Relation Age of Onset  . Cancer Mother     bone  . Other Father     MI  . Asthma Father     Current Facility-Administered Medications  Medication Dose Route  Frequency Provider Last Rate Last Dose  . 0.9 %  sodium chloride infusion  250 mL Intravenous PRN Michael Boston, MD      . acetaminophen (TYLENOL) tablet 650 mg  650 mg Oral Q6H PRN Rise Patience, MD   650 mg at 06/23/15 0441   Or  . acetaminophen (TYLENOL) suppository 650 mg  650 mg Rectal Q6H PRN Rise Patience, MD      . alum & mag hydroxide-simeth (MAALOX/MYLANTA) 200-200-20 MG/5ML suspension 30 mL  30 mL Oral Q6H PRN Michael Boston, MD      . antiseptic oral rinse (CPC / CETYLPYRIDINIUM CHLORIDE 0.05%) solution 7 mL  7 mL Mouth Rinse BID Verlee Monte, MD   7 mL at 06/21/15 2227  . aspirin EC tablet 81 mg  81 mg Oral Daily Michael Boston, MD      . bismuth subsalicylate (PEPTO BISMOL) 262 MG/15ML suspension 30 mL  30 mL Oral Q8H PRN Michael Boston, MD      . chlorhexidine (HIBICLENS) 4 % liquid 1 application  1 application Topical Once Michael Boston, MD   1 application at 18/84/16 2244  . cholecalciferol (VITAMIN D) tablet 1,000 Units  1,000 Units Oral Daily Verlee Monte, MD   1,000 Units at 06/22/15 1443  . diphenhydrAMINE (BENADRYL) capsule 25 mg  25 mg Oral Q6H PRN Michael Boston, MD      . diphenhydrAMINE (BENADRYL) injection 12.5-25 mg  12.5-25 mg Intravenous Q6H PRN Michael Boston, MD      . ezetimibe (ZETIA) tablet 10 mg  10 mg Oral Daily Michael Boston, MD   10 mg at 06/22/15 1443  . hydrALAZINE (APRESOLINE) injection 10 mg  10 mg Intravenous Q4H PRN Rise Patience, MD      . ibuprofen (ADVIL,MOTRIN) tablet 400-800 mg  400-800 mg Oral Q6H PRN Michael Boston, MD      . insulin aspart (novoLOG) injection 0-9 Units  0-9 Units Subcutaneous Q4H Rise Patience, MD   1 Units at 06/23/15 0041  . lactated ringers bolus 1,000 mL  1,000 mL Intravenous Q8H PRN Michael Boston, MD      . levothyroxine (SYNTHROID, LEVOTHROID) tablet 75 mcg  75 mcg Oral QAC breakfast Michael Boston, MD   75 mcg at 06/23/15 [phone]  . lip balm (CARMEX) ointment 1 application  1 application Topical BID Michael Boston, MD    1 application at 01/60/10 2200  . magic mouthwash  15 mL Oral QID PRN Michael Boston, MD      . menthol-cetylpyridinium (CEPACOL) lozenge 3 mg  1 lozenge Oral PRN Michael Boston, MD      . methocarbamol (ROBAXIN) 1,000 mg in dextrose 5 % 50 mL IVPB  1,000 mg Intravenous Q6H PRN Michael Boston, MD      . metoprolol (LOPRESSOR) injection 5 mg  5 mg Intravenous Q6H PRN Michael Boston, MD      . morphine 2 MG/ML injection 1-4 mg  1-4 mg Intravenous Q2H PRN Michael Boston, MD      .  ondansetron (ZOFRAN) tablet 4 mg  4 mg Oral Q6H PRN Rise Patience, MD       Or  . ondansetron [facility]) injection 4 mg  4 mg Intravenous Q6H PRN Rise Patience, MD      . phenol (CHLORASEPTIC) mouth spray 2 spray  2 spray Mouth/Throat PRN Michael Boston, MD      . piperacillin-tazobactam (ZOSYN) IVPB 3.375 g  3.375 g Intravenous 3 times per day Rise Patience, MD   3.375 g at 06/23/15 0539  . promethazine (PHENERGAN) injection 6.25-12.5 mg  6.25-12.5 mg Intravenous Q4H PRN Michael Boston, MD      . psyllium (HYDROCIL/METAMUCIL) packet 1 packet  1 packet Oral BID Michael Boston, MD   1 packet at 06/22/15 1442  . saccharomyces boulardii (FLORASTOR) capsule 250 mg  250 mg Oral BID Michael Boston, MD   250 mg at 06/22/15 2122  . sodium chloride flush (NS) 0.9 % injection 3 mL  3 mL Intravenous Q12H Michael Boston, MD   3 mL at 06/22/15 2200  . sodium chloride flush (NS) 0.9 % injection 3 mL  3 mL Intravenous PRN Michael Boston, MD   3 mL at 06/22/15 1211  . traMADol (ULTRAM) tablet 50-100 mg  50-100 mg Oral Q6H PRN Michael Boston, MD         Allergies  Allergen Reactions  . Niaspan [Niacin Er] Other (See Comments)    Increases blood sugars     Disposition: ED Dismiss - Never Arrived  Discharge Instructions    Call MD for:  extreme fatigue    Complete by:  As directed      Call MD for:  extreme fatigue    Complete by:  As directed      Call MD for:  hives    Complete by:  As directed      Call MD for:  hives    Complete  by:  As directed      Call MD for:  persistant nausea and vomiting    Complete by:  As directed      Call MD for:  persistant nausea and vomiting    Complete by:  As directed      Call MD for:  redness, tenderness, or signs of infection (pain, swelling, redness, odor or green/yellow discharge around incision site)    Complete by:  As directed      Call MD for:  redness, tenderness, or signs of infection (pain, swelling, redness, odor or green/yellow discharge around incision site)    Complete by:  As directed      Call MD for:  severe uncontrolled pain    Complete by:  As directed      Call MD for:  severe uncontrolled pain    Complete by:  As directed      Call MD for:    Complete by:  As directed   Temperature > 101.39F     Call MD for:    Complete by:  As directed   Temperature > 101.39F     Diet - low sodium heart healthy    Complete by:  As directed      Discharge instructions    Complete by:  As directed   Please see discharge instruction sheets.  Also refer to handout given an office.  Please call our office if you have any questions or concerns [phone]     Discharge instructions    Complete by:  As directed  Please see discharge instruction sheets.  Also refer to handout given an office.  Please call our office if you have any questions or concerns [phone]     Discharge wound care:    Complete by:  As directed   If you have closed incisions, shower and bathe over these incisions with soap and water every day.  Remove all surgical dressings on postoperative day #3.  You do not need to replace dressings over the closed incisions unless you feel more comfortable with a Band-Aid covering it.   If you have an open wound that requires packing, please see wound care instructions.  In general, remove all dressings, wash wound with soap and water and then replace with saline moistened gauze.  Do the dressing change at least every day.  Please call our office [phone] if  you have further questions.     Discharge wound care:    Complete by:  As directed   If you have closed incisions, shower and bathe over these incisions with soap and water every day.  Remove all surgical dressings on postoperative day #3.  You do not need to replace dressings over the closed incisions unless you feel more comfortable with a Band-Aid covering it.   If you have an open wound that requires packing, please see wound care instructions.  In general, remove all dressings, wash wound with soap and water and then replace with saline moistened gauze.  Do the dressing change at least every day.  Please call our office [phone] if you have further questions.     Driving Restrictions    Complete by:  As directed   No driving until off narcotics and can safely swerve away without pain during an emergency     Driving Restrictions    Complete by:  As directed   No driving until off narcotics and can safely swerve away without pain during an emergency     Increase activity slowly    Complete by:  As directed   Walk an hour a day.  Use 20-30 minute walks.  When you can walk 30 minutes without difficulty, it is fine to restart low impact/moderate activities such as biking, jogging, swimming, sexual activity, etc.  Eventually you can increase to unrestricted activity when not feeling pain.  If you feel pain: STOP!Marland Kitchen   Let pain protect you from overdoing it.  Use ice/heat & over-the-counter pain medications to help minimize soreness.  If that is not enough, then use your narcotic pain prescription as needed to remain active.  It is better to take extra pain medications and be more active than to stay bedridden to avoid all pain medications.     Increase activity slowly    Complete by:  As directed   Walk an hour a day.  Use 20-30 minute walks.  When you can walk 30 minutes without difficulty, it is fine to restart low impact/moderate activities such as biking, jogging, swimming, sexual activity,  etc.  Eventually you can increase to unrestricted activity when not feeling pain.  If you feel pain: STOP!Marland Kitchen   Let pain protect you from overdoing it.  Use ice/heat & over-the-counter pain medications to help minimize soreness.  If that is not enough, then use your narcotic pain prescription as needed to remain active.  It is better to take extra pain medications and be more active than to stay bedridden to avoid all pain medications.     Lifting restrictions    Complete by:  As directed   Avoid heavy lifting initially.  Do not push through pain.  You have no specific weight limit - if it hurts to do, DON'T DO IT.   If you feel no pain, you are not injuring anything.  Pain will protect you from injury.  Coughing and sneezing are far more stressful to your incision than any lifting.  Avoid resuming heavy lifting / intense activity until off all narcotic pain medications.  When ready to exercise more, give yourself 2 weeks to gradually get back to full intense exercise/activity.     Lifting restrictions    Complete by:  As directed   Avoid heavy lifting initially.  Do not push through pain.  You have no specific weight limit - if it hurts to do, DON'T DO IT.   If you feel no pain, you are not injuring anything.  Pain will protect you from injury.  Coughing and sneezing are far more stressful to your incision than any lifting.  Avoid resuming heavy lifting / intense activity until off all narcotic pain medications.  When ready to exercise more, give yourself 2 weeks to gradually get back to full intense exercise/activity.     May shower / Bathe    Complete by:  As directed      May shower / Bathe    Complete by:  As directed      May walk up steps    Complete by:  As directed      May walk up steps    Complete by:  As directed      Sexual Activity Restrictions    Complete by:  As directed   Sexual activity as tolerated.  Do not push through pain.  Pain will protect you from injury.     Sexual Activity  Restrictions    Complete by:  As directed   Sexual activity as tolerated.  Do not push through pain.  Pain will protect you from injury.     Walk with assistance    Complete by:  As directed   Walk over an hour a day.  May use a walker/cane/companion to help with balance and stamina.     Walk with assistance    Complete by:  As directed   Walk over an hour a day.  May use a walker/cane/companion to help with balance and stamina.            Medication List    TAKE these medications        aspirin 81 MG EC tablet  Take 81 mg by mouth daily.     atorvastatin 80 MG tablet  Commonly known as:  LIPITOR  Take 1 tablet (80 mg total) by mouth daily at 6 PM.     ezetimibe 10 MG tablet  Commonly known as:  ZETIA  Take 1 tablet (10 mg total) by mouth daily.     fenofibrate 160 MG tablet  Take 1 tablet (160 mg total) by mouth daily.     levothyroxine 75 MCG tablet  Commonly known as:  SYNTHROID, LEVOTHROID  Take 1 tablet (75 mcg total) by mouth daily before breakfast.     metFORMIN 1000 MG tablet  Commonly known as:  GLUCOPHAGE  Take 1 tablet (1,000 mg total) by mouth 2 (two) times daily with a meal.     multivitamin with minerals Tabs tablet  Take 1 tablet by mouth daily.     traMADol 50 MG tablet  Commonly known as:  ULTRAM  Take 1-2 tablets (50-100 mg total) by mouth every 6 (six) hours as needed for moderate pain or severe pain.     valsartan-hydrochlorothiazide 320-25 MG tablet  Commonly known as:  DIOVAN-HCT  TAKE 1 TABLET EVERY DAY     Vitamin D-3 5000 UNITS Tabs  Take 1 tablet by mouth daily.           Follow-up Information    Follow up with [facility]. Schedule an appointment as soon as possible for a visit in 3 weeks.   Specialty:  General Surgery   Why:  To follow up after your operation, To follow up after your hospital stay   Contact information:   [place] [phone]        Signed: Morton Peters, M.D., F.A.C.S. Gastrointestinal and Minimally Invasive Surgery [facility] [address], [place], [place] [address] [phone] Main / Paging   06/23/2015, 9:56 AM

## 2015-06-23 NOTE — Progress Notes (Signed)
Assessment unchanged. Pt and wife verbalized understanding of dc instructions through teach back regarding follow up care and when to call the doctor. Script x 1 given as provided by MD. Discharged via foot per pt request to front entrance to meet awaiting vehicle to carry home. Accompanied by family and RN.

## 2015-06-24 ENCOUNTER — Encounter (HOSPITAL_COMMUNITY): Payer: Self-pay | Admitting: Surgery

## 2015-06-24 LAB — GLUCOSE, CAPILLARY
GLUCOSE-CAPILLARY: 90 mg/dL (ref 65–99)
Glucose-Capillary: 95 mg/dL (ref 65–99)
Glucose-Capillary: 99 mg/dL (ref 65–99)
Glucose-Capillary: 97 mg/dL (ref 65–99)

## 2015-07-09 ENCOUNTER — Other Ambulatory Visit: Payer: Self-pay | Admitting: Family Medicine

## 2015-07-23 ENCOUNTER — Other Ambulatory Visit: Payer: Self-pay | Admitting: Family Medicine

## 2015-07-31 ENCOUNTER — Other Ambulatory Visit: Payer: Self-pay | Admitting: Family Medicine

## 2015-07-31 NOTE — Telephone Encounter (Signed)
Per protocol visit 3/1, next sched for 09/2015

## 2015-10-02 ENCOUNTER — Other Ambulatory Visit: Payer: Self-pay | Admitting: Family Medicine

## 2015-10-03 ENCOUNTER — Other Ambulatory Visit: Payer: Medicare Other

## 2015-10-03 DIAGNOSIS — E785 Hyperlipidemia, unspecified: Secondary | ICD-10-CM

## 2015-10-03 DIAGNOSIS — E039 Hypothyroidism, unspecified: Secondary | ICD-10-CM | POA: Diagnosis not present

## 2015-10-03 DIAGNOSIS — I10 Essential (primary) hypertension: Secondary | ICD-10-CM

## 2015-10-03 DIAGNOSIS — E559 Vitamin D deficiency, unspecified: Secondary | ICD-10-CM

## 2015-10-03 DIAGNOSIS — E119 Type 2 diabetes mellitus without complications: Secondary | ICD-10-CM | POA: Diagnosis not present

## 2015-10-03 LAB — BAYER DCA HB A1C WAIVED: HB A1C: 6.9 % (ref <7.0)

## 2015-10-04 LAB — CBC WITH DIFFERENTIAL/PLATELET
BASOS ABS: 0 10*3/uL (ref 0.0–0.2)
BASOS: 1 %
EOS (ABSOLUTE): 0.1 10*3/uL (ref 0.0–0.4)
EOS: 1 %
HEMATOCRIT: 37.5 % (ref 37.5–51.0)
HEMOGLOBIN: 12.6 g/dL (ref 12.6–17.7)
Immature Grans (Abs): 0 10*3/uL (ref 0.0–0.1)
Immature Granulocytes: 0 %
LYMPHS ABS: 2.3 10*3/uL (ref 0.7–3.1)
Lymphs: 38 %
MCH: 29.5 pg (ref 26.6–33.0)
MCHC: 33.6 g/dL (ref 31.5–35.7)
MCV: 88 fL (ref 79–97)
MONOCYTES: 7 %
Monocytes Absolute: 0.4 10*3/uL (ref 0.1–0.9)
NEUTROS ABS: 3.3 10*3/uL (ref 1.4–7.0)
Neutrophils: 53 %
Platelets: 364 10*3/uL (ref 150–379)
RBC: 4.27 x10E6/uL (ref 4.14–5.80)
RDW: 13.9 % (ref 12.3–15.4)
WBC: 6.1 10*3/uL (ref 3.4–10.8)

## 2015-10-04 LAB — HEPATIC FUNCTION PANEL
ALK PHOS: 51 IU/L (ref 39–117)
ALT: 23 IU/L (ref 0–44)
AST: 29 IU/L (ref 0–40)
Albumin: 4.6 g/dL (ref 3.6–4.8)
Bilirubin Total: 0.5 mg/dL (ref 0.0–1.2)
Bilirubin, Direct: 0.22 mg/dL (ref 0.00–0.40)
Total Protein: 6.8 g/dL (ref 6.0–8.5)

## 2015-10-04 LAB — NMR, LIPOPROFILE
Cholesterol: 98 mg/dL — ABNORMAL LOW (ref 100–199)
HDL Cholesterol by NMR: 40 mg/dL (ref >39)
HDL Particle Number: 35.1 umol/L (ref >=30.5)
LDL PARTICLE NUMBER: 721 nmol/L (ref <1000)
LDL SIZE: 20.1 nm (ref >20.5)
LDL-C: 41 mg/dL (ref 0–99)
LP-IR SCORE: 63 — AB (ref <=45)
Small LDL Particle Number: 418 nmol/L (ref <=527)
Triglycerides by NMR: 84 mg/dL (ref 0–149)

## 2015-10-04 LAB — BMP8+EGFR
BUN/Creatinine Ratio: 14 (ref 10–24)
BUN: 18 mg/dL (ref 8–27)
CALCIUM: 9.7 mg/dL (ref 8.6–10.2)
CHLORIDE: 97 mmol/L (ref 96–106)
CO2: 22 mmol/L (ref 18–29)
Creatinine, Ser: 1.25 mg/dL (ref 0.76–1.27)
GFR calc non Af Amer: 59 mL/min/{1.73_m2} — ABNORMAL LOW (ref >59)
GFR, EST AFRICAN AMERICAN: 68 mL/min/{1.73_m2} (ref >59)
Glucose: 122 mg/dL — ABNORMAL HIGH (ref 65–99)
POTASSIUM: 5 mmol/L (ref 3.5–5.2)
SODIUM: 139 mmol/L (ref 134–144)

## 2015-10-04 LAB — THYROID PANEL WITH TSH
Free Thyroxine Index: 3 (ref 1.2–4.9)
T3 UPTAKE RATIO: 29 % (ref 24–39)
T4 TOTAL: 10.2 ug/dL (ref 4.5–12.0)
TSH: 4.84 u[IU]/mL — ABNORMAL HIGH (ref 0.450–4.500)

## 2015-10-04 LAB — VITAMIN D 25 HYDROXY (VIT D DEFICIENCY, FRACTURES): VIT D 25 HYDROXY: 52.8 ng/mL (ref 30.0–100.0)

## 2015-10-17 ENCOUNTER — Ambulatory Visit (INDEPENDENT_AMBULATORY_CARE_PROVIDER_SITE_OTHER): Payer: Medicare Other | Admitting: Family Medicine

## 2015-10-17 ENCOUNTER — Encounter: Payer: Self-pay | Admitting: Family Medicine

## 2015-10-17 VITALS — BP 112/69 | HR 80 | Temp 97.3°F | Ht 70.0 in | Wt 167.0 lb

## 2015-10-17 DIAGNOSIS — N400 Enlarged prostate without lower urinary tract symptoms: Secondary | ICD-10-CM | POA: Diagnosis not present

## 2015-10-17 DIAGNOSIS — I2510 Atherosclerotic heart disease of native coronary artery without angina pectoris: Secondary | ICD-10-CM | POA: Diagnosis not present

## 2015-10-17 DIAGNOSIS — E039 Hypothyroidism, unspecified: Secondary | ICD-10-CM

## 2015-10-17 DIAGNOSIS — I10 Essential (primary) hypertension: Secondary | ICD-10-CM | POA: Diagnosis not present

## 2015-10-17 DIAGNOSIS — E785 Hyperlipidemia, unspecified: Secondary | ICD-10-CM | POA: Diagnosis not present

## 2015-10-17 DIAGNOSIS — E559 Vitamin D deficiency, unspecified: Secondary | ICD-10-CM

## 2015-10-17 DIAGNOSIS — E119 Type 2 diabetes mellitus without complications: Secondary | ICD-10-CM | POA: Diagnosis not present

## 2015-10-17 LAB — MICROSCOPIC EXAMINATION
Bacteria, UA: NONE SEEN
Epithelial Cells (non renal): NONE SEEN /hpf (ref 0–10)
RBC, UA: NONE SEEN /hpf (ref 0–?)
WBC UA: NONE SEEN /HPF (ref 0–?)

## 2015-10-17 LAB — URINALYSIS, COMPLETE
Bilirubin, UA: NEGATIVE
Glucose, UA: NEGATIVE
Ketones, UA: NEGATIVE
LEUKOCYTES UA: NEGATIVE
NITRITE UA: NEGATIVE
PH UA: 7 (ref 5.0–7.5)
Protein, UA: NEGATIVE
RBC, UA: NEGATIVE
Specific Gravity, UA: 1.015 (ref 1.005–1.030)
Urobilinogen, Ur: 0.2 mg/dL (ref 0.2–1.0)

## 2015-10-17 NOTE — Patient Instructions (Addendum)
Medicare Annual Wellness Visit  [facility] and the medical providers at [facility] strive to bring you the best medical care.  In doing so we not only want to address your current medical conditions and concerns but also to detect new conditions early and prevent illness, disease and health-related problems.    Medicare offers a yearly Wellness Visit which allows our clinical staff to assess your need for preventative services including immunizations, lifestyle education, counseling to decrease risk of preventable diseases and screening for fall risk and other medical concerns.    This visit is provided free of charge (no copay) for all Medicare recipients. The clinical pharmacists at [facility] have begun to conduct these Wellness Visits which will also include a thorough review of all your medications.    As you primary medical provider recommend that you make an appointment for your Annual Wellness Visit if you have not done so already this year.  You may set up this appointment before you leave today or you may call back [id number]) and schedule an appointment.  Please make sure when you call that you mention that you are scheduling your Annual Wellness Visit with the clinical pharmacist so that the appointment may be made for the proper length of time.     Continue current medications. Continue good therapeutic lifestyle changes which include good diet and exercise. Fall precautions discussed with patient. If an FOBT was given today- please return it to our front desk. If you are over 66 years old - you may need Prevnar 24 or the adult Pneumonia vaccine.  **Flu shots are available--- please call and schedule a FLU-CLINIC appointment**  After your visit with [place] today you will receive a survey in the mail or online from [employer] regarding your care with [place]. Please take a moment to fill this out. Your feedback is very  important to [place] as you can help [place] better understand your patient needs as well as improve your experience and satisfaction. WE CARE ABOUT YOU!!!   Continue to monitor blood sugars closely and stay active physically Schedule you for a visit with the cardiologist to evaluate further your cardiac status because of your history of diabetes and a family history of heart disease.

## 2015-10-17 NOTE — Progress Notes (Signed)
Subjective:    Patient ID: Matthew Daugherty, male    DOB: 24-May-1945, 70 y.o.   MRN: [id number]  HPI Pt here for follow up and management of chronic medical problems which includes diabetes, hypertension and hyperlipidemia. He is taking medications regularly.Doing well overall and has no specific complaints. He is due for a PSA and prostate exam today and we will review the lab work that has been done on him recently. All cholesterol numbers were good with advanced lipid testing. The hemoglobin A1c was good at 6.9%. The CBC was within normal limits, liver function tests were within normal limits. The TSH is very slightly elevated but improved from 9 months ago. We will just recheck the thyroid profile in 6 weeks and if it's not within normal limits we will increase his medicine 100 g daily. Actually after talking to the patient he had only been taking half of a 75 g. He will go up to a whole 75 g instead of 100 and we will recheck the thyroid profile in 6 weeks as planned. The patient denies any chest pain chest pressure or palpitations or chest tightness. He denies any problems with swallowing heartburn indigestion nausea vomiting diarrhea or blood in the stool. He is up-to-date on his colonoscopies. He's passing his water without problems. His blood sugars at home usually run around 114 fasting. He is up-to-date on his eye exams. He is having no trouble passing his water.     Patient Active Problem List   Diagnosis Date Noted  . Gallstone pancreatitis s/p lap cholecystectomy 06/22/2015 06/19/2015  . Diabetes mellitus type 2, controlled ([place]) 06/19/2015  . Essential hypertension 06/19/2015  . Pancreatitis 06/19/2015  . Hypothyroid 12/05/2012  . Vitamin D deficiency 12/05/2012  . Type 2 diabetes mellitus ([facility]) 12/05/2012  . Hyperlipemia 08/02/2012  . Hypertension   . Cardiac dysrhythmia, unspecified, history of   . BPH (benign prostatic hyperplasia)    Outpatient Encounter Prescriptions as of  10/17/2015  Medication Sig  . aspirin 81 MG EC tablet Take 81 mg by mouth daily.    Marland Kitchen atorvastatin (LIPITOR) 80 MG tablet TAKE 1 TABLET EVERY DAY  AT  6PM  . Cholecalciferol (VITAMIN D-3) 5000 UNITS TABS Take 1 tablet by mouth daily.    Marland Kitchen ezetimibe (ZETIA) 10 MG tablet Take 1 tablet (10 mg total) by mouth daily.  . fenofibrate 160 MG tablet TAKE 1 TABLET EVERY DAY  . levothyroxine (SYNTHROID, LEVOTHROID) 75 MCG tablet Take 1 tablet (75 mcg total) by mouth daily before breakfast.  . metFORMIN (GLUCOPHAGE) 1000 MG tablet TAKE 1 TABLET TWICE DAILY WITH MEAL  . Multiple Vitamin (MULTIVITAMIN WITH MINERALS) TABS tablet Take 1 tablet by mouth daily.  . valsartan-hydrochlorothiazide (DIOVAN-HCT) 320-25 MG tablet TAKE 1 TABLET EVERY DAY  . [DISCONTINUED] traMADol (ULTRAM) 50 MG tablet Take 1-2 tablets (50-100 mg total) by mouth every 6 (six) hours as needed for moderate pain or severe pain.   No facility-administered encounter medications on file as of 10/17/2015.      Review of Systems  Constitutional: Negative.   HENT: Negative.   Eyes: Negative.   Respiratory: Negative.   Cardiovascular: Negative.   Gastrointestinal: Negative.   Endocrine: Negative.   Genitourinary: Negative.   Musculoskeletal: Negative.   Skin: Negative.   Allergic/Immunologic: Negative.   Neurological: Negative.   Hematological: Negative.   Psychiatric/Behavioral: Negative.        Objective:   Physical Exam  Constitutional: He is oriented to person, place, and time.  He appears well-developed and well-nourished. No distress.  HENT:  Head: Normocephalic and atraumatic.  Right Ear: External ear normal.  Left Ear: External ear normal.  Nose: Nose normal.  Mouth/Throat: Oropharynx is clear and moist. No oropharyngeal exudate.  Eyes: Conjunctivae and EOM are normal. Pupils are equal, round, and reactive to light. Right eye exhibits no discharge. Left eye exhibits no discharge. No scleral icterus.  Neck: Normal range  of motion. Neck supple. No thyromegaly present.  No carotid bruits or thyromegaly  Cardiovascular: Normal rate, regular rhythm, normal heart sounds and intact distal pulses.   No murmur heard. The heart is regular at 72/m  Pulmonary/Chest: Effort normal and breath sounds normal. No respiratory distress. He has no wheezes. He has no rales. He exhibits no tenderness.  Clear anteriorly and posteriorly no axillary adenopathy  Abdominal: Soft. Bowel sounds are normal. He exhibits no mass. There is no tenderness. There is no rebound and no guarding.  Normal bowel sounds without bruits organ enlargement or inguinal adenopathy  Genitourinary: Rectum normal and penis normal.  The prostate was slightly enlarged but soft and smooth. There are no rectal masses. The external genitalia were within normal limits. There are no inguinal hernias palpable. Rectal masses.  Musculoskeletal: Normal range of motion. He exhibits no edema or tenderness.  Lymphadenopathy:    He has no cervical adenopathy.  Neurological: He is alert and oriented to person, place, and time. He has normal reflexes. No cranial nerve deficit.  Skin: Skin is warm and dry. No rash noted.  Psychiatric: He has a normal mood and affect. His behavior is normal. Judgment and thought content normal.  Nursing note and vitals reviewed.   BP 112/69 mmHg  Pulse 80  Temp(Src) 97.3 F (36.3 C) (Oral)  Ht 5\' 10"  (1.778 m)  Wt 167 lb (75.751 kg)  BMI 23.96 kg/m2       Assessment & Plan:  1. Type 2 diabetes mellitus without complication, without long-term current use of insulin (HCC) -Continue current treatment and monitoring blood sugars regularly -Continue with aggressive therapeutic lifestyle changes - Microalbumin / creatinine urine ratio - Ambulatory referral to Cardiology  2. Hypertension -The blood pressure is good today and patient will continue with current treatment and sodium restriction - Microalbumin / creatinine urine ratio -  Ambulatory referral to Cardiology  3. Hyperlipemia -Cholesterol numbers were excellent and he will continue with current treatment - Ambulatory referral to Cardiology  4. Hypothyroidism, unspecified hypothyroidism type -The patient will take 75 g daily and we'll recheck his thyroid profile in 6 weeks  5. Vitamin D deficiency -Continue vitamin D replacement  6. BPH (benign prostatic hyperplasia) -The prostate is enlarged but the patient is having no symptoms with passing his water. - Urinalysis, Complete - PSA, total and free  Patient Instructions                       Medicare Annual Wellness Visit  [facility] and the medical providers at [facility] strive to bring you the best medical care.  In doing so we not only want to address your current medical conditions and concerns but also to detect new conditions early and prevent illness, disease and health-related problems.    Medicare offers a yearly Wellness Visit which allows our clinical staff to assess your need for preventative services including immunizations, lifestyle education, counseling to decrease risk of preventable diseases and screening for fall risk and other medical concerns.  This visit is provided free of charge (no copay) for all Medicare recipients. The clinical pharmacists at [facility] have begun to conduct these Wellness Visits which will also include a thorough review of all your medications.    As you primary medical provider recommend that you make an appointment for your Annual Wellness Visit if you have not done so already this year.  You may set up this appointment before you leave today or you may call back [phone]) and schedule an appointment.  Please make sure when you call that you mention that you are scheduling your Annual Wellness Visit with the clinical pharmacist so that the appointment may be made for the proper length of time.     Continue  current medications. Continue good therapeutic lifestyle changes which include good diet and exercise. Fall precautions discussed with patient. If an FOBT was given today- please return it to our front desk. If you are over 81 years old - you may need Prevnar 53 or the adult Pneumonia vaccine.  **Flu shots are available--- please call and schedule a FLU-CLINIC appointment**  After your visit with [place] today you will receive a survey in the mail or online from [employer] regarding your care with [place]. Please take a moment to fill this out. Your feedback is very important to [place] as you can help [place] better understand your patient needs as well as improve your experience and satisfaction. WE CARE ABOUT YOU!!!   Continue to monitor blood sugars closely and stay active physically Schedule you for a visit with the cardiologist to evaluate further your cardiac status because of your history of diabetes and a family history of heart disease.    Arrie Senate MD

## 2015-10-18 LAB — PSA, TOTAL AND FREE
PROSTATE SPECIFIC AG, SERUM: 2 ng/mL (ref 0.0–4.0)
PSA FREE: 0.45 ng/mL
PSA, Free Pct: 22.5 %

## 2015-10-18 LAB — MICROALBUMIN / CREATININE URINE RATIO
Creatinine, Urine: 85.8 mg/dL
MICROALB/CREAT RATIO: <3.5 mg/g creat (ref 0.0–30.0)
Microalbumin, Urine: <3 ug/mL

## 2015-11-08 ENCOUNTER — Other Ambulatory Visit: Payer: Self-pay | Admitting: *Deleted

## 2015-11-08 DIAGNOSIS — E039 Hypothyroidism, unspecified: Secondary | ICD-10-CM

## 2015-11-14 ENCOUNTER — Other Ambulatory Visit: Payer: Medicare Other

## 2015-11-14 DIAGNOSIS — E039 Hypothyroidism, unspecified: Secondary | ICD-10-CM | POA: Diagnosis not present

## 2015-11-14 DIAGNOSIS — R899 Unspecified abnormal finding in specimens from other organs, systems and tissues: Secondary | ICD-10-CM

## 2015-11-15 LAB — THYROID PANEL WITH TSH
Free Thyroxine Index: 2.9 (ref 1.2–4.9)
T3 UPTAKE RATIO: 26 % (ref 24–39)
T4 TOTAL: 11.1 ug/dL (ref 4.5–12.0)
TSH: 2.12 u[IU]/mL (ref 0.450–4.500)

## 2015-11-25 ENCOUNTER — Other Ambulatory Visit: Payer: Self-pay | Admitting: Family Medicine

## 2015-12-04 ENCOUNTER — Other Ambulatory Visit: Payer: Self-pay | Admitting: Family Medicine

## 2015-12-12 ENCOUNTER — Encounter: Payer: Self-pay | Admitting: Cardiology

## 2015-12-23 NOTE — Progress Notes (Addendum)
Cardiology Office Note   Date:  12/25/2015   ID:  Matthew Daugherty, DOB 1945/11/02, MRN [id number]  PCP:  Redge Gainer, MD  Cardiologist:   Minus Breeding, MD   CC:  Diabetes.     History of Present Illness: Matthew Daugherty is a 70 y.o. male who presents for evaluation of multiple cardiovascular risk factors.  I saw him 11 years ago.  He's had treadmill tests over the years because of family history. He's been noted to have ectopy on exam but he never felt this. He is otherwise had no cardiac history. He's a very active gentleman. Pushes a molar. He tends to his vegetables. He has proved trees. He uses a chain saw. The patient denies any new symptoms such as chest discomfort, neck or arm discomfort. There has been no new shortness of breath, PND or orthopnea. There have been no reported palpitations, presyncope or syncope.   Past Medical History:  Diagnosis Date  . Cardiac dysrhythmia, unspecified   . Essential hypertension, benign   . Hyperplasia of prostate   . Other and unspecified hyperlipidemia   . Unspecified transient cerebral ischemia     Past Surgical History:  Procedure Laterality Date  . APPENDECTOMY    . LAPAROSCOPIC CHOLECYSTECTOMY SINGLE SITE WITH INTRAOPERATIVE CHOLANGIOGRAM N/A 06/22/2015   Procedure: LAPAROSCOPIC CHOLECYSTECTOMY SINGLE SITE WITH INTRAOPERATIVE CHOLANGIOGRAM, PRIMARY UMBILICAL HERNIA REPAIR;  Surgeon: Michael Boston, MD;  Location: [facility];  Service: General;  Laterality: N/A;     Current Outpatient Prescriptions  Medication Sig Dispense Refill  . aspirin 81 MG EC tablet Take 81 mg by mouth daily.      Marland Kitchen atorvastatin (LIPITOR) 80 MG tablet Take 80 mg by mouth daily.    . Cholecalciferol (VITAMIN D-3) 5000 UNITS TABS Take 1 tablet by mouth daily.      Marland Kitchen ezetimibe (ZETIA) 10 MG tablet Take 1 tablet (10 mg total) by mouth daily. 90 tablet 3  . fenofibrate 160 MG tablet Take 160 mg by mouth daily.    Marland Kitchen levothyroxine (SYNTHROID, LEVOTHROID) 75 MCG tablet  Take 1 tablet (75 mcg total) by mouth daily before breakfast. 90 tablet 3  . metFORMIN (GLUCOPHAGE) 1000 MG tablet TAKE 1 TABLET TWICE DAILY WITH MEAL 180 tablet 1  . Multiple Vitamin (MULTIVITAMIN WITH MINERALS) TABS tablet Take 1 tablet by mouth daily.    . valsartan-hydrochlorothiazide (DIOVAN-HCT) 320-25 MG tablet Take 1 tablet by mouth daily.     No current facility-administered medications for this visit.     Allergies:   Niaspan [niacin er]    Social History:  The patient  reports that he quit smoking about 17 years ago. His smoking use included Cigarettes. He started smoking about 61 years ago. He smoked 1.00 pack per day. He has never used smokeless tobacco. He reports that he does not drink alcohol or use drugs.   Family History:  The patient's family history includes Asthma in his father; Cancer in his mother; Other (age of onset: 33) in his father.    ROS:  Please see the history of present illness.   Otherwise, review of systems are positive for none.   All other systems are reviewed and negative.    PHYSICAL EXAM: VS:  BP 116/79   Pulse 80   Ht 5' 9.5" (1.765 m)   Wt 173 lb (78.5 kg)   BMI 25.18 kg/m  , BMI Body mass index is 25.18 kg/m. GENERAL:  Well appearing HEENT:  Pupils equal round  and reactive, fundi not visualized, oral mucosa unremarkable NECK:  No jugular venous distention, waveform within normal limits, carotid upstroke brisk and symmetric, no bruits, no thyromegaly LYMPHATICS:  No cervical, inguinal adenopathy LUNGS:  Clear to auscultation bilaterally BACK:  No CVA tenderness CHEST:  Unremarkable HEART:  PMI not displaced or sustained,S1 and S2 within normal limits, no S3, no S4, no clicks, no rubs, no murmurs ABD:  Flat, positive bowel sounds normal in frequency in pitch, no bruits, no rebound, no guarding, no midline pulsatile mass, no hepatomegaly, no splenomegaly EXT:  2 plus pulses throughout, no edema, no cyanosis no clubbing SKIN:  No rashes no  nodules NEURO:  Cranial nerves II through XII grossly intact, motor grossly intact throughout PSYCH:  Cognitively intact, oriented to person place and time    EKG:  EKG is ordered today. The ekg ordered today demonstrates sinus rhythm, rate 83, axis within normal limits, intervals within normal limits, no acute ST-T wave changes   Recent Labs: 06/23/2015: Hemoglobin 10.2; Magnesium 1.6 10/03/2015: ALT 23; BUN 18; Creatinine, Ser 1.25; Platelets 364; Potassium 5.0; Sodium 139 11/14/2015: TSH 2.120    Lipid Panel    Component Value Date/Time   CHOL 98 (L) 10/03/2015 1039   CHOL 110 05/29/2015 1657   CHOL 94 11/03/2012 0815   TRIG 84 10/03/2015 1039   TRIG 75 11/03/2012 0815   HDL 40 10/03/2015 1039   HDL 37 (L) 11/03/2012 0815   CHOLHDL 5.5 06/20/2015 0416   VLDL 37 06/20/2015 0416   LDLCALC 31 06/20/2015 0416   LDLCALC 44 05/29/2015 1657   LDLCALC 67 11/29/2013 0811   LDLCALC 42 11/03/2012 0815      Wt Readings from Last 3 Encounters:  12/25/15 173 lb (78.5 kg)  10/17/15 167 lb (75.8 kg)  06/23/15 176 lb 5.9 oz (80 kg)      Other studies Reviewed: Additional studies/ records that were reviewed today include: Labs. Review of the above records demonstrates:  Please see elsewhere in the note.     ASSESSMENT AND PLAN:  DM:  His A1c was 6.9. This is treated per Dr. Laurance Flatten  HTN:  The blood pressure is at target. No change in medications is indicated. We will continue with therapeutic lifestyle changes (TLC).  HYPERLIPIDEMIA:  His lipid profile was excellent. Your continuing the meds as listed.  SCREENING:  The patient does have multiple cardiovascular risk factors. I will bring the patient back for a POET (Plain Old Exercise Test). This will allow me to screen for obstructive coronary disease, risk stratify and very importantly provide a prescription for exercise.   Current medicines are reviewed at length with the patient today.  The patient does not have concerns  regarding medicines.  The following changes have been made:  no change  Labs/ tests ordered today include:   Orders Placed This Encounter  Procedures  . Exercise Tolerance Test  . EKG 12-Lead     Disposition:   FU with me as needed.     Signed, Minus Breeding, MD  12/25/2015 3:51 PM    [facility]

## 2015-12-25 ENCOUNTER — Encounter: Payer: Self-pay | Admitting: Cardiology

## 2015-12-25 ENCOUNTER — Ambulatory Visit (INDEPENDENT_AMBULATORY_CARE_PROVIDER_SITE_OTHER): Payer: Medicare Other | Admitting: Cardiology

## 2015-12-25 VITALS — BP 116/79 | HR 80 | Ht 69.5 in | Wt 173.0 lb

## 2015-12-25 DIAGNOSIS — E785 Hyperlipidemia, unspecified: Secondary | ICD-10-CM | POA: Diagnosis not present

## 2015-12-25 DIAGNOSIS — I10 Essential (primary) hypertension: Secondary | ICD-10-CM | POA: Diagnosis not present

## 2015-12-25 DIAGNOSIS — I2510 Atherosclerotic heart disease of native coronary artery without angina pectoris: Secondary | ICD-10-CM

## 2015-12-25 NOTE — Patient Instructions (Signed)
Medication Instructions:  The current medical regimen is effective;  continue present plan and medications.  Testing/Procedures: Your physician has requested that you have an exercise tolerance test at [facility].  You will be contacted about getting this scheduled.  Follow-Up: Follow up as needed with Dr Percival Spanish.  If you need a refill on your cardiac medications before your next appointment, please call your pharmacy.  Thank you for choosing [facility]!!

## 2015-12-27 ENCOUNTER — Ambulatory Visit (INDEPENDENT_AMBULATORY_CARE_PROVIDER_SITE_OTHER): Payer: Medicare Other | Admitting: *Deleted

## 2015-12-27 VITALS — BP 130/73 | HR 80 | Temp 97.5°F | Ht 71.0 in | Wt 171.0 lb

## 2015-12-27 DIAGNOSIS — Z0000 Encounter for general adult medical examination without abnormal findings: Secondary | ICD-10-CM | POA: Diagnosis not present

## 2015-12-27 DIAGNOSIS — I10 Essential (primary) hypertension: Secondary | ICD-10-CM

## 2015-12-27 NOTE — Progress Notes (Signed)
Subjective:   Matthew Daugherty is a 70 y.o. male who presents for an Initial Medicare Annual Wellness Visit. Matthew Daugherty retired from the [employer] 2 years ago after 60 years of service. He lives at home with his wife. He has a dog that visits between his house and the neighbors house. He recently saw Dr Percival Spanish and was cleared for an in office exercise tolerance test.    Review of Systems  All systems negative      Objective:    Today's Vitals   12/27/15 0832  BP: 130/73  Pulse: 80  Temp: 97.5 F (36.4 C)  TempSrc: Oral  Weight: 171 lb (77.6 kg)  Height: 5\' 11"  (1.803 m)   Body mass index is 23.85 kg/m.  Current Medications (verified) Outpatient Encounter Prescriptions as of 12/27/2015  Medication Sig  . aspirin 81 MG EC tablet Take 81 mg by mouth daily.    Marland Kitchen atorvastatin (LIPITOR) 80 MG tablet Take 80 mg by mouth daily.  . Cholecalciferol (VITAMIN D-3) 5000 UNITS TABS Take 1 tablet by mouth daily.    Marland Kitchen ezetimibe (ZETIA) 10 MG tablet Take 1 tablet (10 mg total) by mouth daily.  . fenofibrate 160 MG tablet Take 160 mg by mouth daily.  Marland Kitchen levothyroxine (SYNTHROID, LEVOTHROID) 75 MCG tablet Take 1 tablet (75 mcg total) by mouth daily before breakfast.  . metFORMIN (GLUCOPHAGE) 1000 MG tablet TAKE 1 TABLET TWICE DAILY WITH MEAL  . Multiple Vitamin (MULTIVITAMIN WITH MINERALS) TABS tablet Take 1 tablet by mouth daily.  . valsartan-hydrochlorothiazide (DIOVAN-HCT) 320-25 MG tablet Take 1 tablet by mouth daily.   No facility-administered encounter medications on file as of 12/27/2015.     Allergies (verified) Niaspan [niacin er]   History: Past Medical History:  Diagnosis Date  . Cardiac dysrhythmia, unspecified   . Diabetes mellitus without complication ([place])   . Essential hypertension, benign   . Hyperplasia of prostate   . Other and unspecified hyperlipidemia   . Thyroid disease   . Unspecified transient cerebral ischemia    Past Surgical History:  Procedure  Laterality Date  . APPENDECTOMY    . CHOLECYSTECTOMY    . LAPAROSCOPIC CHOLECYSTECTOMY SINGLE SITE WITH INTRAOPERATIVE CHOLANGIOGRAM N/A 06/22/2015   Procedure: LAPAROSCOPIC CHOLECYSTECTOMY SINGLE SITE WITH INTRAOPERATIVE CHOLANGIOGRAM, PRIMARY UMBILICAL HERNIA REPAIR;  Surgeon: Michael Boston, MD;  Location: [facility];  Service: General;  Laterality: N/A;   Family History  Problem Relation Age of Onset  . Cancer Mother     bone  . Other Father 51    MI  . Asthma Father    1 sister and 4 brothers that are healthy. 1 son that is healthy 2 granddaughters that are healthy  Social History   Occupational History  . Not on file.   Social History Main Topics  . Smoking status: Former Smoker    Packs/day: 1.00    Types: Cigarettes    Start date: 04/20/1954    Quit date: 06/13/1998  . Smokeless tobacco: Never Used  . Alcohol use No  . Drug use: No  . Sexual activity: Not on file   Tobacco Counseling No tobacco use  Activities of Daily Living In your present state of health, do you have any difficulty performing the following activities: 12/27/2015 06/20/2015  Hearing? N N  Vision? N N  Difficulty concentrating or making decisions? N N  Walking or climbing stairs? N N  Dressing or bathing? N N  Doing errands, shopping? N N  Some recent  data might be hidden    Immunizations and Health Maintenance Immunization History  Administered Date(s) Administered  . Influenza Whole 11/18/2008  . Influenza, High Dose Seasonal PF 02/22/2015  . Influenza,inj,Quad PF,36+ Mos 04/11/2013, 04/19/2014  . Pneumococcal Conjugate-13 04/11/2013  . Pneumococcal Polysaccharide-23 04/19/2014  . Tdap 06/18/2008  . Zoster 04/30/2010   Health Maintenance Due  Topic Date Due  . INFLUENZA VACCINE  11/19/2015    Patient Care Team: Chipper Herb, MD as PCP - General (Family Medicine) Clarene Essex, MD (Gastroenterology)  Minus Breeding, MD (Cardiology)     Assessment:   This is a routine wellness  examination for Matthew Daugherty.   Hearing/Vision screen No hearing deficit. Vision normal with glasses. Eye exam due 01/2016. He will request a copy of the report be sent to our office.   Dietary issues and exercise activities discussed:    Goals    . Exercise 3x per week (30 min per time)          Walk for 30 minutes at least 3 times weekly     No formal exercise but he does garden, yard work, and Clinical research associate.   Depression Screen PHQ 2/9 Scores 12/27/2015 10/17/2015 06/19/2015 06/12/2015  PHQ - 2 Score 0 0 0 0    Fall Risk Fall Risk  12/27/2015 10/17/2015 06/19/2015 06/12/2015 01/09/2015  Falls in the past year? No No No No No  Risk for fall due to : - - - - -    Cognitive Function: MMSE - Mini Mental State Exam 12/27/2015  Orientation to time 5  Orientation to Place 5  Registration 3  Attention/ Calculation 5  Recall 3  Language- name 2 objects 2  Language- repeat 1  Language- follow 3 step command 3  Language- read & follow direction 1  Write a sentence 1  Copy design 1  Total score 30  No deficit  Screening Tests Health Maintenance  Topic Date Due  . INFLUENZA VACCINE  11/19/2015  . Hepatitis C Screening  06/17/2016 (Originally [birth year])  . OPHTHALMOLOGY EXAM  02/03/2016  . HEMOGLOBIN A1C  04/03/2016  . COLON CANCER SCREENING ANNUAL FOBT  06/13/2016  . FOOT EXAM  10/16/2016  . TETANUS/TDAP  06/19/2018  . COLONOSCOPY  11/23/2018  . ZOSTAVAX  Completed  . PNA vac Low Risk Adult  Completed        Plan:   Keep follow up appointment with Dr Laurance Flatten in Nov 2017. Exercise Tolerance Test scheduled with Dr Dettinger for 01/09/16 at 9:30.   During the course of the visit Matthew Daugherty was educated and counseled about the following appropriate screening and preventive services:   Vaccines to include Pneumoccal,Tdap, Zostavax- Up to date. Influenza vaccine due at the end of September. Patient plans to wait until f/u appt with Dr Laurance Flatten in 02/2016.  Electrocardiogram-2 days ago with Dr  Percival Spanish  Colorectal cancer screening-2 years ago. Due 2020.  Cardiovascular disease screening-patient of Dr Percival Spanish  Glaucoma screening-Yearly. Due 01/2017  Nutrition counseling-Plenty of low glycemic fruits, vegetables, lean proteins, and limited fried foods.  Prostate cancer screening-yearly   Patient Instructions (the written plan) were given to the patient.   Chong Sicilian, RN   12/27/2015

## 2015-12-27 NOTE — Patient Instructions (Signed)
  Mr. Matthew Daugherty ,  Thank you for taking time to come for your Medicare Wellness Visit. I appreciate your ongoing commitment to your health goals. Please review the following plan we discussed and let me know if I can assist you in the future.   These are the goals we discussed: Goals    . Exercise 3x per week (30 min per time)          Walk for 30 minutes at least 3 times weekly       This is a list of the screening recommended for you and due dates:  Health Maintenance  Topic Date Due  . Flu Shot  11/19/2015  .  Hepatitis C: One time screening is recommended by Center for Disease Control  (CDC) for  adults born from [birth year] through 1963.   06/17/2016*  . Eye exam for diabetics  02/03/2016  . Hemoglobin A1C  04/03/2016  . Stool Blood Test  06/13/2016  . Complete foot exam   10/16/2016  . Tetanus Vaccine  06/19/2018  . Colon Cancer Screening  11/23/2018  . Shingles Vaccine  Completed  . Pneumonia vaccines  Completed  *Topic was postponed. The date shown is not the original due date.

## 2016-01-01 ENCOUNTER — Telehealth: Payer: Self-pay | Admitting: *Deleted

## 2016-01-01 NOTE — Telephone Encounter (Signed)
-----   Message from Worthy Rancher, MD sent at 01/01/2016  1:12 PM EDT ----- This guy is probably too high risk for STD here because of his diabetes with other risk factors.   ----- Message ----- From: Thana Ates, LPN Sent: [id number]  10:32 AM To: Georga Kaufmann, LPN, Fransisca Kaufmann Dettinger, MD  Please review for treadmill

## 2016-01-01 NOTE — Telephone Encounter (Signed)
Cyril Mourning will notify patient.

## 2016-01-03 ENCOUNTER — Other Ambulatory Visit: Payer: Self-pay | Admitting: *Deleted

## 2016-01-03 DIAGNOSIS — I10 Essential (primary) hypertension: Secondary | ICD-10-CM

## 2016-01-03 NOTE — Progress Notes (Signed)
Patient was referred to Dr Matthew Daugherty by Dr Laurance Flatten for a cardiac stress test. Patient was cleared for the stress test and asked to be scheduled at our office. Dr Matthew Daugherty ordered the test. That order was cancelled and the order used at [facility] was placed so that it could be scheduled here. Dr Dettinger reviewed the record and said that he was not a candidate to have the treadmill at [facility]. He is being referred back to cardiology for them to schedule the stress test. Patient is aware.

## 2016-01-06 ENCOUNTER — Telehealth: Payer: Self-pay | Admitting: Cardiology

## 2016-01-06 NOTE — Telephone Encounter (Signed)
New Message  Pt calling to set up an appt for an echo in the [place] office. No order in the system. Please call back to discuss

## 2016-01-06 NOTE — Telephone Encounter (Signed)
Clarified w/ patient - this is to set up exercise stress test - patient is aware already, the call request was documented incorrectly. There is a current order in system - will relay to scheduling pool to set up.

## 2016-01-06 NOTE — Telephone Encounter (Signed)
Call back number for previous message

## 2016-01-08 ENCOUNTER — Other Ambulatory Visit: Payer: Self-pay | Admitting: *Deleted

## 2016-01-08 DIAGNOSIS — E785 Hyperlipidemia, unspecified: Secondary | ICD-10-CM

## 2016-01-08 DIAGNOSIS — Z9189 Other specified personal risk factors, not elsewhere classified: Secondary | ICD-10-CM

## 2016-01-08 DIAGNOSIS — I10 Essential (primary) hypertension: Secondary | ICD-10-CM

## 2016-01-10 ENCOUNTER — Telehealth: Payer: Self-pay

## 2016-01-14 ENCOUNTER — Other Ambulatory Visit: Payer: Self-pay | Admitting: Family Medicine

## 2016-01-17 ENCOUNTER — Ambulatory Visit (INDEPENDENT_AMBULATORY_CARE_PROVIDER_SITE_OTHER): Payer: Medicare Other

## 2016-01-17 DIAGNOSIS — E785 Hyperlipidemia, unspecified: Secondary | ICD-10-CM

## 2016-01-17 DIAGNOSIS — I10 Essential (primary) hypertension: Secondary | ICD-10-CM

## 2016-01-17 DIAGNOSIS — Z9189 Other specified personal risk factors, not elsewhere classified: Secondary | ICD-10-CM | POA: Diagnosis not present

## 2016-01-17 LAB — EXERCISE TOLERANCE TEST
CHL CUP STRESS STAGE 1 DBP: 87 mmHg
CHL CUP STRESS STAGE 1 GRADE: 0 %
CHL CUP STRESS STAGE 1 HR: 102 {beats}/min
CHL CUP STRESS STAGE 1 SBP: 151 mmHg
CHL CUP STRESS STAGE 1 SPEED: 0 mph
CHL CUP STRESS STAGE 2 GRADE: 0 %
CHL CUP STRESS STAGE 2 HR: 102 {beats}/min
CHL CUP STRESS STAGE 3 GRADE: 0.1 %
CHL CUP STRESS STAGE 3 HR: 103 {beats}/min
CHL CUP STRESS STAGE 4 SBP: 194 mmHg
CHL CUP STRESS STAGE 5 SPEED: 2.5 mph
CHL CUP STRESS STAGE 6 DBP: 90 mmHg
CHL CUP STRESS STAGE 6 GRADE: 0 %
CHL CUP STRESS STAGE 6 SPEED: 0 mph
CHL CUP STRESS STAGE 7 DBP: 75 mmHg
CHL CUP STRESS STAGE 7 GRADE: 0 %
CHL CUP STRESS STAGE 7 HR: 103 {beats}/min
CHL CUP STRESS STAGE 7 SBP: 155 mmHg
CSEPED: 4 min
CSEPHR: 102 %
Estimated workload: 5.8 METS
Exercise duration (sec): 0 s
MPHR: 153 {beats}/min
Peak HR: 155 {beats}/min
Percent of predicted max HR: 101 %
RPE: 15
Rest HR: 93 {beats}/min
Stage 2 Speed: 1 mph
Stage 3 Speed: 1 mph
Stage 4 DBP: 94 mmHg
Stage 4 Grade: 10 %
Stage 4 HR: 148 {beats}/min
Stage 4 Speed: 1.7 mph
Stage 5 Grade: 12 %
Stage 5 HR: 155 {beats}/min
Stage 6 HR: 150 {beats}/min
Stage 6 SBP: 192 mmHg
Stage 7 Speed: 0 mph

## 2016-01-31 DIAGNOSIS — E119 Type 2 diabetes mellitus without complications: Secondary | ICD-10-CM | POA: Diagnosis not present

## 2016-01-31 DIAGNOSIS — H40033 Anatomical narrow angle, bilateral: Secondary | ICD-10-CM | POA: Diagnosis not present

## 2016-01-31 LAB — HM DIABETES EYE EXAM

## 2016-02-05 ENCOUNTER — Other Ambulatory Visit: Payer: Self-pay | Admitting: Family Medicine

## 2016-02-10 ENCOUNTER — Other Ambulatory Visit: Payer: Self-pay | Admitting: Family Medicine

## 2016-02-12 ENCOUNTER — Other Ambulatory Visit (INDEPENDENT_AMBULATORY_CARE_PROVIDER_SITE_OTHER): Payer: Medicare Other

## 2016-02-12 DIAGNOSIS — E559 Vitamin D deficiency, unspecified: Secondary | ICD-10-CM

## 2016-02-12 DIAGNOSIS — E7800 Pure hypercholesterolemia, unspecified: Secondary | ICD-10-CM

## 2016-02-12 DIAGNOSIS — E119 Type 2 diabetes mellitus without complications: Secondary | ICD-10-CM

## 2016-02-12 DIAGNOSIS — I10 Essential (primary) hypertension: Secondary | ICD-10-CM | POA: Diagnosis not present

## 2016-02-12 DIAGNOSIS — N400 Enlarged prostate without lower urinary tract symptoms: Secondary | ICD-10-CM

## 2016-02-12 LAB — BAYER DCA HB A1C WAIVED: HB A1C: 6.7 % (ref <7.0)

## 2016-02-13 LAB — CBC WITH DIFFERENTIAL/PLATELET
BASOS ABS: 0 10*3/uL (ref 0.0–0.2)
Basos: 0 %
EOS (ABSOLUTE): 0.1 10*3/uL (ref 0.0–0.4)
Eos: 1 %
HEMOGLOBIN: 13.1 g/dL (ref 12.6–17.7)
Hematocrit: 40 % (ref 37.5–51.0)
Immature Grans (Abs): 0 10*3/uL (ref 0.0–0.1)
Immature Granulocytes: 0 %
LYMPHS ABS: 2.6 10*3/uL (ref 0.7–3.1)
Lymphs: 33 %
MCH: 28.9 pg (ref 26.6–33.0)
MCHC: 32.8 g/dL (ref 31.5–35.7)
MCV: 88 fL (ref 79–97)
MONOCYTES: 8 %
MONOS ABS: 0.6 10*3/uL (ref 0.1–0.9)
NEUTROS ABS: 4.6 10*3/uL (ref 1.4–7.0)
Neutrophils: 58 %
Platelets: 400 10*3/uL — ABNORMAL HIGH (ref 150–379)
RBC: 4.54 x10E6/uL (ref 4.14–5.80)
RDW: 13.5 % (ref 12.3–15.4)
WBC: 8 10*3/uL (ref 3.4–10.8)

## 2016-02-13 LAB — NMR, LIPOPROFILE
CHOLESTEROL: 113 mg/dL (ref 100–199)
HDL CHOLESTEROL BY NMR: 37 mg/dL — AB (ref >39)
HDL Particle Number: 34.4 umol/L (ref >=30.5)
LDL PARTICLE NUMBER: 828 nmol/L (ref <1000)
LDL Size: 20.2 nm (ref >20.5)
LDL-C: 54 mg/dL (ref 0–99)
LP-IR Score: 66 — ABNORMAL HIGH (ref <=45)
Small LDL Particle Number: 529 nmol/L — ABNORMAL HIGH (ref <=527)
TRIGLYCERIDES BY NMR: 108 mg/dL (ref 0–149)

## 2016-02-13 LAB — HEPATIC FUNCTION PANEL
ALBUMIN: 4.8 g/dL (ref 3.6–4.8)
ALT: 31 IU/L (ref 0–44)
AST: 34 IU/L (ref 0–40)
Alkaline Phosphatase: 52 IU/L (ref 39–117)
Bilirubin Total: 0.5 mg/dL (ref 0.0–1.2)
Bilirubin, Direct: 0.2 mg/dL (ref 0.00–0.40)
TOTAL PROTEIN: 7.2 g/dL (ref 6.0–8.5)

## 2016-02-13 LAB — BMP8+EGFR
BUN/Creatinine Ratio: 14 (ref 10–24)
BUN: 16 mg/dL (ref 8–27)
CHLORIDE: 100 mmol/L (ref 96–106)
CO2: 24 mmol/L (ref 18–29)
Calcium: 10.5 mg/dL — ABNORMAL HIGH (ref 8.6–10.2)
Creatinine, Ser: 1.16 mg/dL (ref 0.76–1.27)
GFR calc Af Amer: 75 mL/min/{1.73_m2} (ref >59)
GFR calc non Af Amer: 65 mL/min/{1.73_m2} (ref >59)
GLUCOSE: 134 mg/dL — AB (ref 65–99)
POTASSIUM: 5.7 mmol/L — AB (ref 3.5–5.2)
SODIUM: 140 mmol/L (ref 134–144)

## 2016-02-13 LAB — VITAMIN D 25 HYDROXY (VIT D DEFICIENCY, FRACTURES): Vit D, 25-Hydroxy: 50 ng/mL (ref 30.0–100.0)

## 2016-02-26 ENCOUNTER — Ambulatory Visit: Payer: Medicare Other | Admitting: Family Medicine

## 2016-03-09 ENCOUNTER — Encounter: Payer: Self-pay | Admitting: Family Medicine

## 2016-03-09 ENCOUNTER — Ambulatory Visit (INDEPENDENT_AMBULATORY_CARE_PROVIDER_SITE_OTHER): Payer: Medicare Other | Admitting: Family Medicine

## 2016-03-09 VITALS — BP 136/71 | HR 92 | Temp 97.6°F | Ht 71.0 in | Wt 175.0 lb

## 2016-03-09 DIAGNOSIS — E119 Type 2 diabetes mellitus without complications: Secondary | ICD-10-CM

## 2016-03-09 DIAGNOSIS — N400 Enlarged prostate without lower urinary tract symptoms: Secondary | ICD-10-CM | POA: Diagnosis not present

## 2016-03-09 DIAGNOSIS — E559 Vitamin D deficiency, unspecified: Secondary | ICD-10-CM | POA: Diagnosis not present

## 2016-03-09 DIAGNOSIS — I10 Essential (primary) hypertension: Secondary | ICD-10-CM

## 2016-03-09 DIAGNOSIS — Z23 Encounter for immunization: Secondary | ICD-10-CM

## 2016-03-09 DIAGNOSIS — E7800 Pure hypercholesterolemia, unspecified: Secondary | ICD-10-CM | POA: Diagnosis not present

## 2016-03-09 DIAGNOSIS — E875 Hyperkalemia: Secondary | ICD-10-CM

## 2016-03-09 DIAGNOSIS — I2510 Atherosclerotic heart disease of native coronary artery without angina pectoris: Secondary | ICD-10-CM | POA: Diagnosis not present

## 2016-03-09 NOTE — Patient Instructions (Addendum)
Medicare Annual Wellness Visit  [facility] and the medical providers at [facility] strive to bring you the best medical care.  In doing so we not only want to address your current medical conditions and concerns but also to detect new conditions early and prevent illness, disease and health-related problems.    Medicare offers a yearly Wellness Visit which allows our clinical staff to assess your need for preventative services including immunizations, lifestyle education, counseling to decrease risk of preventable diseases and screening for fall risk and other medical concerns.    This visit is provided free of charge (no copay) for all Medicare recipients. The clinical pharmacists at [facility] have begun to conduct these Wellness Visits which will also include a thorough review of all your medications.    As you primary medical provider recommend that you make an appointment for your Annual Wellness Visit if you have not done so already this year.  You may set up this appointment before you leave today or you may call back [phone] and schedule an appointment.  Please make sure when you call that you mention that you are scheduling your Annual Wellness Visit with the clinical pharmacist so that the appointment may be made for the proper length of time.     Continue current medications. Continue good therapeutic lifestyle changes which include good diet and exercise. Fall precautions discussed with patient. If an FOBT was given today- please return it to our front desk. If you are over 79 years old - you may need Prevnar 86 or the adult Pneumonia vaccine.  **Flu shots are available--- please call and schedule a FLU-CLINIC appointment**  After your visit with [place] today you will receive a survey in the mail or online from [employer] regarding your care with [place]. Please take a moment to fill this out. Your feedback is very  important to [place] as you can help [place] better understand your patient needs as well as improve your experience and satisfaction. WE CARE ABOUT YOU!!!   Stay active physically Keep feet checked regularly Avoid irritating environments and wear respiratory protection when needed Drink plenty of fluids and stay well hydrated this winter and keep the house as cool as possible We will call with the repeat BMP results as soon as that becomes available

## 2016-03-09 NOTE — Progress Notes (Signed)
Subjective:    Patient ID: Matthew Daugherty, male    DOB: July 03, 1945, 70 y.o.   MRN: [id number]  HPI Pt here for follow up and management of chronic medical problems which includes diabetes, hyperlipidemia, and hypertension. He is taking medications regularly.The patient is doing well overall with no specific complaints. He is had lab work done and this will be reviewed with him during the visit today. All of his cholesterol numbers with advanced lipid testing were excellent with a total LDL particle number being 828 and LDL C being good at 54. The CBC was within normal limits except for slightly elevated platelet count. The blood sugar was elevated at 134 and the creatinine and electrolytes were within normal limits except the potassium was slightly elevated and we will ask that that be repeated today. The vitamin D level was good at 50 an A1c was excellent at 6.7%. The patient denies any chest pain pressure or palpitations or shortness of breath. He denies any problems with heartburn indigestion nausea vomiting blood in the stool or black tarry bowel movements. He is passing his water without problems. He does remind me today that with his last colonoscopy he did have a polyp and therefore his next colonoscopy will be repeated in August 2020. Patient will get his flu shot today.    Patient Active Problem List   Diagnosis Date Noted  . Gallstone pancreatitis s/p lap cholecystectomy 06/22/2015 06/19/2015  . Diabetes mellitus type 2, controlled ([place]) 06/19/2015  . Essential hypertension 06/19/2015  . Pancreatitis 06/19/2015  . Hypothyroid 12/05/2012  . Vitamin D deficiency 12/05/2012  . Type 2 diabetes mellitus ([place]) 12/05/2012  . Hyperlipemia 08/02/2012  . Hypertension   . Cardiac dysrhythmia, unspecified, history of   . BPH (benign prostatic hyperplasia)    Outpatient Encounter Prescriptions as of 03/09/2016  Medication Sig  . aspirin 81 MG EC tablet Take 81 mg by mouth daily.    Marland Kitchen  atorvastatin (LIPITOR) 80 MG tablet TAKE 1 TABLET EVERY DAY  AT  6PM  . Cholecalciferol (VITAMIN D-3) 5000 UNITS TABS Take 1 tablet by mouth daily.    Marland Kitchen ezetimibe (ZETIA) 10 MG tablet Take 1 tablet (10 mg total) by mouth daily.  . fenofibrate 160 MG tablet TAKE 1 TABLET EVERY DAY  . levothyroxine (SYNTHROID, LEVOTHROID) 75 MCG tablet TAKE 1 TABLET EVERY DAY BEFORE BREAKFAST  . metFORMIN (GLUCOPHAGE) 1000 MG tablet TAKE 1 TABLET TWICE DAILY WITH MEAL  . Multiple Vitamin (MULTIVITAMIN WITH MINERALS) TABS tablet Take 1 tablet by mouth daily.  . valsartan-hydrochlorothiazide (DIOVAN-HCT) 320-25 MG tablet TAKE 1 TABLET EVERY DAY   No facility-administered encounter medications on file as of 03/09/2016.       Review of Systems  Constitutional: Negative.   HENT: Negative.   Eyes: Negative.   Respiratory: Negative.   Cardiovascular: Negative.   Gastrointestinal: Negative.   Endocrine: Negative.   Genitourinary: Negative.   Musculoskeletal: Negative.   Skin: Negative.   Allergic/Immunologic: Negative.   Neurological: Negative.   Hematological: Negative.   Psychiatric/Behavioral: Negative.        Objective:   Physical Exam  Constitutional: He is oriented to person, place, and time. He appears well-developed and well-nourished. No distress.  Pleasant and relaxed and alert  HENT:  Head: Normocephalic and atraumatic.  Right Ear: External ear normal.  Left Ear: External ear normal.  Mouth/Throat: Oropharynx is clear and moist. No oropharyngeal exudate.  Slight nasal congestion with clear rhinorrhea on the left  Eyes: Conjunctivae  and EOM are normal. Pupils are equal, round, and reactive to light. Right eye exhibits no discharge. Left eye exhibits no discharge. No scleral icterus.  Neck: Normal range of motion. Neck supple. No thyromegaly present.  No bruits thyromegaly or anterior cervical adenopathy  Cardiovascular: Normal rate, regular rhythm, normal heart sounds and intact distal  pulses.   No murmur heard. The heart has a regular rate and rhythm at 84/m  Pulmonary/Chest: Effort normal and breath sounds normal. No respiratory distress. He has no wheezes. He has no rales. He exhibits no tenderness.  Clear anteriorly and posteriorly and no axillary adenopathy  Abdominal: Soft. Bowel sounds are normal. He exhibits no mass. There is no tenderness. There is no rebound and no guarding.  No abdominal tenderness or organ enlargement bruits or masses  Musculoskeletal: Normal range of motion. He exhibits no edema.  Lymphadenopathy:    He has no cervical adenopathy.  Neurological: He is alert and oriented to person, place, and time. He has normal reflexes. No cranial nerve deficit.  Skin: Skin is warm and dry. No rash noted.  Psychiatric: He has a normal mood and affect. His behavior is normal. Judgment and thought content normal.  Nursing note and vitals reviewed.  BP 136/71 (BP Location: Left Arm)   Pulse 92   Temp 97.6 F (36.4 C) (Oral)   Ht '5\' 11"'  (1.803 m)   Wt 175 lb (79.4 kg)   BMI 24.41 kg/m         Assessment & Plan:  1. Hypertension -The blood pressure is good today and the patient will continue with current treatment  2. Type 2 diabetes mellitus without complication, without long-term current use of insulin (H[facility]) -The hemoglobin A1c was also good he will continue with current treatment and aggressive therapeutic lifestyle changes  3. Pure hypercholesterolemia -Cholesterol numbers were excellent and he will continue with therapeutic lifestyle changes and current treatment  4. Vitamin D deficiency -Continue with current vitamin D dosage  5. Benign prostatic hyperplasia, unspecified whether lower urinary tract symptoms present -The patient describes no symptoms with this today and is passing his water well.  6. Hyperkalemia -Repeat BMP for elevated potassium and slightly elevated calcium. The patient is not fasting for this. - BMP8+EGFR  Patient  Instructions                       Medicare Annual Wellness Visit  [facility] and the medical providers at [facility] strive to bring you the best medical care.  In doing so we not only want to address your current medical conditions and concerns but also to detect new conditions early and prevent illness, disease and health-related problems.    Medicare offers a yearly Wellness Visit which allows our clinical staff to assess your need for preventative services including immunizations, lifestyle education, counseling to decrease risk of preventable diseases and screening for fall risk and other medical concerns.    This visit is provided free of charge (no copay) for all Medicare recipients. The clinical pharmacists at [facility] have begun to conduct these Wellness Visits which will also include a thorough review of all your medications.    As you primary medical provider recommend that you make an appointment for your Annual Wellness Visit if you have not done so already this year.  You may set up this appointment before you leave today or you may call back [phone]) and schedule an appointment.  Please  make sure when you call that you mention that you are scheduling your Annual Wellness Visit with the clinical pharmacist so that the appointment may be made for the proper length of time.     Continue current medications. Continue good therapeutic lifestyle changes which include good diet and exercise. Fall precautions discussed with patient. If an FOBT was given today- please return it to our front desk. If you are over 54 years old - you may need Prevnar 3 or the adult Pneumonia vaccine.  **Flu shots are available--- please call and schedule a FLU-CLINIC appointment**  After your visit with [place] today you will receive a survey in the mail or online from [employer] regarding your care with [place]. Please take a moment to fill this out. Your feedback  is very important to [place] as you can help [place] better understand your patient needs as well as improve your experience and satisfaction. WE CARE ABOUT YOU!!!   Stay active physically Keep feet checked regularly Avoid irritating environments and wear respiratory protection when needed Drink plenty of fluids and stay well hydrated this winter and keep the house as cool as possible We will call with the repeat BMP results as soon as that becomes available    Arrie Senate MD

## 2016-03-10 LAB — BMP8+EGFR
BUN/Creatinine Ratio: 13 (ref 10–24)
BUN: 14 mg/dL (ref 8–27)
CALCIUM: 10.4 mg/dL — AB (ref 8.6–10.2)
CO2: 27 mmol/L (ref 18–29)
CREATININE: 1.06 mg/dL (ref 0.76–1.27)
Chloride: 96 mmol/L (ref 96–106)
GFR calc Af Amer: 84 mL/min/{1.73_m2} (ref >59)
GFR calc non Af Amer: 72 mL/min/{1.73_m2} (ref >59)
GLUCOSE: 195 mg/dL — AB (ref 65–99)
Potassium: 4.5 mmol/L (ref 3.5–5.2)
SODIUM: 138 mmol/L (ref 134–144)

## 2016-03-30 ENCOUNTER — Other Ambulatory Visit: Payer: Self-pay | Admitting: Family Medicine

## 2016-04-14 ENCOUNTER — Other Ambulatory Visit: Payer: Self-pay | Admitting: Family Medicine

## 2016-04-15 ENCOUNTER — Other Ambulatory Visit: Payer: Self-pay | Admitting: Family Medicine

## 2016-05-26 ENCOUNTER — Other Ambulatory Visit: Payer: Self-pay | Admitting: Family Medicine

## 2016-05-26 DIAGNOSIS — R911 Solitary pulmonary nodule: Secondary | ICD-10-CM

## 2016-06-03 ENCOUNTER — Ambulatory Visit (HOSPITAL_COMMUNITY)
Admission: RE | Admit: 2016-06-03 | Discharge: 2016-06-03 | Disposition: A | Discharge status: Home / Self Care | Payer: Medicare Other | Source: Ambulatory Visit | Attending: Family Medicine | Admitting: Family Medicine

## 2016-06-03 DIAGNOSIS — I700 Atherosclerosis of aorta: Secondary | ICD-10-CM | POA: Diagnosis not present

## 2016-06-03 DIAGNOSIS — I2510 Atherosclerotic heart disease of native coronary artery without angina pectoris: Secondary | ICD-10-CM | POA: Diagnosis not present

## 2016-06-03 DIAGNOSIS — R911 Solitary pulmonary nodule: Secondary | ICD-10-CM

## 2016-06-03 DIAGNOSIS — R918 Other nonspecific abnormal finding of lung field: Secondary | ICD-10-CM | POA: Diagnosis not present

## 2016-06-08 ENCOUNTER — Other Ambulatory Visit: Payer: Self-pay | Admitting: *Deleted

## 2016-06-08 MED ORDER — AZITHROMYCIN 250 MG PO TABS: ORAL_TABLET | ORAL | 0 refills | Status: DC

## 2016-06-17 ENCOUNTER — Other Ambulatory Visit: Payer: Self-pay | Admitting: Family Medicine

## 2016-06-24 ENCOUNTER — Telehealth: Payer: Self-pay

## 2016-07-10 ENCOUNTER — Other Ambulatory Visit (INDEPENDENT_AMBULATORY_CARE_PROVIDER_SITE_OTHER): Payer: Medicare Other

## 2016-07-10 DIAGNOSIS — E559 Vitamin D deficiency, unspecified: Secondary | ICD-10-CM

## 2016-07-10 DIAGNOSIS — E119 Type 2 diabetes mellitus without complications: Secondary | ICD-10-CM

## 2016-07-10 DIAGNOSIS — I10 Essential (primary) hypertension: Secondary | ICD-10-CM | POA: Diagnosis not present

## 2016-07-10 DIAGNOSIS — N400 Enlarged prostate without lower urinary tract symptoms: Secondary | ICD-10-CM

## 2016-07-10 DIAGNOSIS — E7800 Pure hypercholesterolemia, unspecified: Secondary | ICD-10-CM | POA: Diagnosis not present

## 2016-07-10 LAB — BAYER DCA HB A1C WAIVED: HB A1C: 7.5 % — AB (ref <7.0)

## 2016-07-11 LAB — BMP8+EGFR
BUN/Creatinine Ratio: 11 (ref 10–24)
BUN: 16 mg/dL (ref 8–27)
CALCIUM: 10.3 mg/dL — AB (ref 8.6–10.2)
CHLORIDE: 98 mmol/L (ref 96–106)
CO2: 22 mmol/L (ref 18–29)
Creatinine, Ser: 1.4 mg/dL — ABNORMAL HIGH (ref 0.76–1.27)
GFR calc Af Amer: 59 mL/min/{1.73_m2} — ABNORMAL LOW (ref >59)
GFR calc non Af Amer: 51 mL/min/{1.73_m2} — ABNORMAL LOW (ref >59)
GLUCOSE: 185 mg/dL — AB (ref 65–99)
POTASSIUM: 5.3 mmol/L — AB (ref 3.5–5.2)
Sodium: 138 mmol/L (ref 134–144)

## 2016-07-11 LAB — HEPATIC FUNCTION PANEL
ALBUMIN: 4.7 g/dL (ref 3.6–4.8)
ALK PHOS: 60 IU/L (ref 39–117)
ALT: 30 IU/L (ref 0–44)
AST: 26 IU/L (ref 0–40)
BILIRUBIN TOTAL: 0.4 mg/dL (ref 0.0–1.2)
Bilirubin, Direct: 0.18 mg/dL (ref 0.00–0.40)
TOTAL PROTEIN: 7.2 g/dL (ref 6.0–8.5)

## 2016-07-11 LAB — CBC WITH DIFFERENTIAL/PLATELET
Basophils Absolute: 0 10*3/uL (ref 0.0–0.2)
Basos: 0 %
EOS (ABSOLUTE): 0.1 10*3/uL (ref 0.0–0.4)
EOS: 1 %
HEMATOCRIT: 39.8 % (ref 37.5–51.0)
HEMOGLOBIN: 13.4 g/dL (ref 13.0–17.7)
IMMATURE GRANULOCYTES: 1 %
Immature Grans (Abs): 0.1 10*3/uL (ref 0.0–0.1)
LYMPHS: 31 %
Lymphocytes Absolute: 2.5 10*3/uL (ref 0.7–3.1)
MCH: 29.7 pg (ref 26.6–33.0)
MCHC: 33.7 g/dL (ref 31.5–35.7)
MCV: 88 fL (ref 79–97)
MONOCYTES: 8 %
Monocytes Absolute: 0.6 10*3/uL (ref 0.1–0.9)
NEUTROS PCT: 59 %
Neutrophils Absolute: 4.8 10*3/uL (ref 1.4–7.0)
Platelets: 422 10*3/uL — ABNORMAL HIGH (ref 150–379)
RBC: 4.51 x10E6/uL (ref 4.14–5.80)
RDW: 14.3 % (ref 12.3–15.4)
WBC: 7.9 10*3/uL (ref 3.4–10.8)

## 2016-07-11 LAB — LIPID PANEL
CHOL/HDL RATIO: 3.7 ratio (ref 0.0–5.0)
Cholesterol, Total: 133 mg/dL (ref 100–199)
HDL: 36 mg/dL — ABNORMAL LOW (ref >39)
LDL CALC: 70 mg/dL (ref 0–99)
TRIGLYCERIDES: 136 mg/dL (ref 0–149)
VLDL CHOLESTEROL CAL: 27 mg/dL (ref 5–40)

## 2016-07-11 LAB — VITAMIN D 25 HYDROXY (VIT D DEFICIENCY, FRACTURES): Vit D, 25-Hydroxy: 45 ng/mL (ref 30.0–100.0)

## 2016-07-13 ENCOUNTER — Telehealth: Payer: Self-pay | Admitting: Family Medicine

## 2016-07-13 NOTE — Telephone Encounter (Signed)
Patient aware and verbalizes understanding. 

## 2016-07-24 ENCOUNTER — Encounter: Payer: Self-pay | Admitting: Family Medicine

## 2016-07-24 ENCOUNTER — Ambulatory Visit (INDEPENDENT_AMBULATORY_CARE_PROVIDER_SITE_OTHER): Payer: Medicare Other | Admitting: Family Medicine

## 2016-07-24 VITALS — BP 135/66 | HR 83 | Temp 96.8°F | Ht 71.0 in | Wt 178.0 lb

## 2016-07-24 DIAGNOSIS — E119 Type 2 diabetes mellitus without complications: Secondary | ICD-10-CM | POA: Diagnosis not present

## 2016-07-24 DIAGNOSIS — E559 Vitamin D deficiency, unspecified: Secondary | ICD-10-CM | POA: Diagnosis not present

## 2016-07-24 DIAGNOSIS — N400 Enlarged prostate without lower urinary tract symptoms: Secondary | ICD-10-CM | POA: Diagnosis not present

## 2016-07-24 DIAGNOSIS — R911 Solitary pulmonary nodule: Secondary | ICD-10-CM

## 2016-07-24 DIAGNOSIS — Z1211 Encounter for screening for malignant neoplasm of colon: Secondary | ICD-10-CM

## 2016-07-24 DIAGNOSIS — I10 Essential (primary) hypertension: Secondary | ICD-10-CM

## 2016-07-24 DIAGNOSIS — I700 Atherosclerosis of aorta: Secondary | ICD-10-CM | POA: Diagnosis not present

## 2016-07-24 DIAGNOSIS — E7800 Pure hypercholesterolemia, unspecified: Secondary | ICD-10-CM

## 2016-07-24 NOTE — Patient Instructions (Addendum)
Medicare Annual Wellness Visit  [facility] and the medical providers at [facility] strive to bring you the best medical care.  In doing so we not only want to address your current medical conditions and concerns but also to detect new conditions early and prevent illness, disease and health-related problems.    Medicare offers a yearly Wellness Visit which allows our clinical staff to assess your need for preventative services including immunizations, lifestyle education, counseling to decrease risk of preventable diseases and screening for fall risk and other medical concerns.    This visit is provided free of charge (no copay) for all Medicare recipients. The clinical pharmacists at [facility] have begun to conduct these Wellness Visits which will also include a thorough review of all your medications.    As you primary medical provider recommend that you make an appointment for your Annual Wellness Visit if you have not done so already this year.  You may set up this appointment before you leave today or you may call back [phone]) and schedule an appointment.  Please make sure when you call that you mention that you are scheduling your Annual Wellness Visit with the clinical pharmacist so that the appointment may be made for the proper length of time.    Continue current medications. Continue good therapeutic lifestyle changes which include good diet and exercise. Fall precautions discussed with patient. If an FOBT was given today- please return it to our front desk. If you are over 38 years old - you may need Prevnar 68 or the adult Pneumonia vaccine.  **Flu shots are available--- please call and schedule a FLU-CLINIC appointment**  After your visit with [place] today you will receive a survey in the mail or online from [employer] regarding your care with [place]. Please take a moment to fill this out. Your feedback is very  important to [place] as you can help [place] better understand your patient needs as well as improve your experience and satisfaction. WE CARE ABOUT YOU!!!   The patient understands that he will need a repeat noncontrast chest CT next year in February or March. If we do not call him with an appointment he is to call us back and make sure that we get this scheduled. He should continue to follow-up aggressive therapeutic lifestyle changes to keep blood sugar and cholesterol under control. He should continue with current medicines. He should avoid NSAIDs like ibuprofen and Aleve.

## 2016-07-24 NOTE — Progress Notes (Signed)
Subjective:    Patient ID: Matthew Daugherty, male    DOB: 03-27-1946, 71 y.o.   MRN: [id number]  HPI Pt here for follow up and management of chronic medical problems which includes hyperlipidemia and diabetes. He is taking medication regularly.The patient is doing well today with no specific complaints. His weight is up 3 pounds based on the previous visit. His body mass index is 24.5. He is due to return an FOBT. He has had his lab work done and this will be reviewed with him during the visit today. 11 A1c was higher than in the past and is now 7.5%. The blood sugar on his BMP was elevated at 185 fasting. The creatinine this time was elevated for the first time in the past several months at 1.40. The electrolytes including potassium had a slightly elevated potassium at 5.3. The serum calcium was also slightly elevated but lower than it has been in the past. The hemoglobin was good at 13.4 and the white blood cell count was normal. The platelet count was also slightly elevated. All liver function tests were normal and the vitamin D level was good at 45.0. Cholesterol numbers with traditional lipid testing have a total cholesterol that was good at 133. The LDL C cholesterol was good and at goal at 70. The HDL cholesterol was low but improved from what it had been in the past. The patient had a recent CT scan on 06/03/2016. This was a follow-up scan. The previously visualized pulmonary nodules at the lung bases are stable back to one year ago. The radiologist notes that no further follow-up scans are necessary if he is at low risk. If the scan is done a repeat 9 crack contrast chest CT should be done in 12 months. It is important to note that the CT scan did say that he had aortic atherosclerosis as well as left main and three-vessel coronary atherosclerosis. All this will be reviewed with the patient and he will be scheduled to have one more repeat CT scan without contrast in 1 year. The patient denies any  problems. He is had no chest pain no shortness of breath no PND no problems with nausea vomiting diarrhea blood in the stool or black tarry bowel movements. His bowel habits are unchanged. He is passing his water without problems. It is important to note that he is aware of the chest CT and he will need to get another chest CT in about 1 year because of the pulmonary nodule. Is also important to note because of the aortic and coronary atherosclerosis that he did have a visit with the cardiologist in the past year and had a normal stress test.     Patient Active Problem List   Diagnosis Date Noted  . Gallstone pancreatitis s/p lap cholecystectomy 06/22/2015 06/19/2015  . Diabetes mellitus type 2, controlled ([place]) 06/19/2015  . Essential hypertension 06/19/2015  . Pancreatitis 06/19/2015  . Hypothyroid 12/05/2012  . Vitamin D deficiency 12/05/2012  . Type 2 diabetes mellitus ([place]) 12/05/2012  . Hyperlipemia 08/02/2012  . Hypertension   . Cardiac dysrhythmia, unspecified, history of   . BPH (benign prostatic hyperplasia)    Outpatient Encounter Prescriptions as of 07/24/2016  Medication Sig  . aspirin 81 MG EC tablet Take 81 mg by mouth daily.    Marland Kitchen atorvastatin (LIPITOR) 80 MG tablet TAKE 1 TABLET EVERY DAY  AT  6PM  . Cholecalciferol (VITAMIN D-3) 5000 UNITS TABS Take 1 tablet by mouth daily.    Marland Kitchen  ezetimibe (ZETIA) 10 MG tablet Take 1 tablet (10 mg total) by mouth daily.  . fenofibrate 160 MG tablet TAKE 1 TABLET EVERY DAY  . levothyroxine (SYNTHROID, LEVOTHROID) 75 MCG tablet TAKE 1 TABLET EVERY DAY BEFORE BREAKFAST  . metFORMIN (GLUCOPHAGE) 1000 MG tablet TAKE 1 TABLET TWICE DAILY WITH MEAL  . Multiple Vitamin (MULTIVITAMIN WITH MINERALS) TABS tablet Take 1 tablet by mouth daily.  . valsartan-hydrochlorothiazide (DIOVAN-HCT) 320-25 MG tablet TAKE 1 TABLET EVERY DAY  . [DISCONTINUED] azithromycin (ZITHROMAX) 250 MG tablet Take 2 po today and then 1 tablet day 2-5   No facility-administered  encounter medications on file as of 07/24/2016.      Review of Systems  Constitutional: Negative.   HENT: Negative.   Eyes: Negative.   Respiratory: Negative.   Cardiovascular: Negative.   Gastrointestinal: Negative.   Endocrine: Negative.   Genitourinary: Negative.   Musculoskeletal: Negative.   Skin: Negative.   Allergic/Immunologic: Negative.   Neurological: Negative.   Hematological: Negative.   Psychiatric/Behavioral: Negative.        Objective:   Physical Exam  Constitutional: He is oriented to person, place, and time. He appears well-developed and well-nourished.  The patient is pleasant and alert and fully aware of his medicines in health condition.  HENT:  Head: Normocephalic and atraumatic.  Right Ear: External ear normal.  Left Ear: External ear normal.  Nose: Nose normal.  Mouth/Throat: Oropharynx is clear and moist. No oropharyngeal exudate.  Eyes: Conjunctivae and EOM are normal. Pupils are equal, round, and reactive to light. Right eye exhibits no discharge. Left eye exhibits no discharge. No scleral icterus.  Neck: Normal range of motion. Neck supple. No thyromegaly present.  No bruits thyromegaly or anterior cervical adenopathy  Cardiovascular: Normal rate, regular rhythm, normal heart sounds and intact distal pulses.   No murmur heard. Heart has a regular rate and rhythm at 84/m  Pulmonary/Chest: Effort normal and breath sounds normal. No respiratory distress. He has no wheezes. He has no rales. He exhibits no tenderness.  No axillary adenopathy  Abdominal: Soft. Bowel sounds are normal. He exhibits no mass. There is no tenderness. There is no rebound and no guarding.  No liver or spleen enlargement. No masses. No bruits. No inguinal adenopathy.  Musculoskeletal: Normal range of motion. He exhibits no edema or tenderness.  Lymphadenopathy:    He has no cervical adenopathy.  Neurological: He is alert and oriented to person, place, and time. He has normal  reflexes. No cranial nerve deficit.  Skin: Skin is warm and dry. No rash noted.  Psychiatric: He has a normal mood and affect. His behavior is normal. Judgment and thought content normal.  Nursing note and vitals reviewed.   BP 135/66 (BP Location: Left Arm)   Pulse 83   Temp (!) 96.8 F (36 C) (Oral)   Ht 5\' 11"  (1.803 m)   Wt 178 lb (80.7 kg)   BMI 24.83 kg/m        Assessment & Plan:  1. Type 2 diabetes mellitus without complication, without long-term current use of insulin ([place]) -Continue with current treatment but more aggressive therapeutic lifestyle changes. -The patient is aware of his increased A1c and we'll do better with therapeutic lifestyle changes  2. Hypertension -The blood pressure is good and he will continue with current treatment  3. Pure hypercholesterolemia -Cholesterol numbers remain good and he will continue with aggressive therapeutic lifestyle changes and current treatment  4. Vitamin D deficiency - Continue with vitamin D replacement  5. Benign prostatic hyperplasia, unspecified whether lower urinary tract symptoms present -The patient is having no symptoms with this and we'll get a rectal exam sooner at his next visit.  6. Pulmonary nodule -The recent follow-up CT scan was reviewed with the patient. He is now had 2 CT scans for following the pulmonary nodule. The patient understands that even though the CT scan showed stability that he will get one more noncontrast CT scan early next year in February or March. He understands that if we do not call him with this and he will call us and remind [place] to get the CT scan.  7. Aortic atherosclerosis ([place]) -The patient will continue with low cholesterol diet and all efforts to keep blood sugar down as low as possible.  Patient Instructions                       Medicare Annual Wellness Visit  [facility] and the medical providers at [facility] strive to bring you the best medical  care.  In doing so we not only want to address your current medical conditions and concerns but also to detect new conditions early and prevent illness, disease and health-related problems.    Medicare offers a yearly Wellness Visit which allows our clinical staff to assess your need for preventative services including immunizations, lifestyle education, counseling to decrease risk of preventable diseases and screening for fall risk and other medical concerns.    This visit is provided free of charge (no copay) for all Medicare recipients. The clinical pharmacists at [facility] have begun to conduct these Wellness Visits which will also include a thorough review of all your medications.    As you primary medical provider recommend that you make an appointment for your Annual Wellness Visit if you have not done so already this year.  You may set up this appointment before you leave today or you may call back ([phone]) and schedule an appointment.  Please make sure when you call that you mention that you are scheduling your Annual Wellness Visit with the clinical pharmacist so that the appointment may be made for the proper length of time.    Continue current medications. Continue good therapeutic lifestyle changes which include good diet and exercise. Fall precautions discussed with patient. If an FOBT was given today- please return it to our front desk. If you are over 45 years old - you may need Prevnar 19 or the adult Pneumonia vaccine.  **Flu shots are available--- please call and schedule a FLU-CLINIC appointment**  After your visit with [place] today you will receive a survey in the mail or online from [employer] regarding your care with [place]. Please take a moment to fill this out. Your feedback is very important to [place] as you can help [place] better understand your patient needs as well as improve your experience and satisfaction. WE CARE ABOUT YOU!!!   The patient understands  that he will need a repeat noncontrast chest CT next year in February or March. If we do not call him with an appointment he is to call us back and make sure that we get this scheduled. He should continue to follow-up aggressive therapeutic lifestyle changes to keep blood sugar and cholesterol under control. He should continue with current medicines. He should avoid NSAIDs like ibuprofen and Aleve.  Arrie Senate MD

## 2016-07-24 NOTE — Addendum Note (Signed)
Addended by: Zannie Cove on: 07/24/2016 03:28 PM   Modules accepted: Orders

## 2016-07-25 LAB — FECAL OCCULT BLOOD, IMMUNOCHEMICAL: FECAL OCCULT BLD: NEGATIVE

## 2016-08-17 ENCOUNTER — Other Ambulatory Visit: Payer: Self-pay | Admitting: Family Medicine

## 2016-08-31 ENCOUNTER — Other Ambulatory Visit: Payer: Self-pay | Admitting: Family Medicine

## 2016-09-23 ENCOUNTER — Other Ambulatory Visit: Payer: Self-pay | Admitting: Family Medicine

## 2016-11-03 ENCOUNTER — Other Ambulatory Visit: Payer: Self-pay | Admitting: Family Medicine

## 2016-11-24 ENCOUNTER — Telehealth: Payer: Self-pay | Admitting: *Deleted

## 2016-11-24 MED ORDER — LOSARTAN POTASSIUM-HCTZ 100-25 MG PO TABS: 1.0000 | ORAL_TABLET | Freq: Every day | ORAL | 0 refills | Status: DC

## 2016-11-24 NOTE — Telephone Encounter (Signed)
If the patient is currently taking valsartan HCTZ 320-25 one daily, he can change this to losartin 100/25 one daily. Please have patient check blood pressure readings at home and bring these readings by for review by the nurse in a couple of weeks after making this change.

## 2016-11-24 NOTE — Telephone Encounter (Addendum)
Change of medication sent to [facility] for pt regarding BP reading recommendations

## 2016-11-24 NOTE — Telephone Encounter (Signed)
Pt on Valsartan/HCTZ 320-25 mg tab  Suggested alternatives: losartan, irbesartan, losartan/hctz  Please advise

## 2016-11-24 NOTE — Addendum Note (Signed)
Addended by: Antonietta Barcelona D on: 11/24/2016 11:29 AM   Modules accepted: Orders

## 2016-11-26 ENCOUNTER — Other Ambulatory Visit: Payer: Medicare Other

## 2016-11-26 DIAGNOSIS — E7800 Pure hypercholesterolemia, unspecified: Secondary | ICD-10-CM

## 2016-11-26 DIAGNOSIS — N400 Enlarged prostate without lower urinary tract symptoms: Secondary | ICD-10-CM | POA: Diagnosis not present

## 2016-11-26 DIAGNOSIS — I10 Essential (primary) hypertension: Secondary | ICD-10-CM

## 2016-11-26 DIAGNOSIS — E119 Type 2 diabetes mellitus without complications: Secondary | ICD-10-CM

## 2016-11-26 DIAGNOSIS — R710 Precipitous drop in hematocrit: Secondary | ICD-10-CM

## 2016-11-26 DIAGNOSIS — E559 Vitamin D deficiency, unspecified: Secondary | ICD-10-CM | POA: Diagnosis not present

## 2016-11-26 LAB — BAYER DCA HB A1C WAIVED: HB A1C: 6.8 % (ref <7.0)

## 2016-11-27 LAB — CBC WITH DIFFERENTIAL/PLATELET
Basophils Absolute: 0 10*3/uL (ref 0.0–0.2)
Basos: 0 %
EOS (ABSOLUTE): 0.1 10*3/uL (ref 0.0–0.4)
EOS: 1 %
HEMATOCRIT: 39.4 % (ref 37.5–51.0)
HEMOGLOBIN: 12.8 g/dL — AB (ref 13.0–17.7)
Immature Grans (Abs): 0 10*3/uL (ref 0.0–0.1)
Immature Granulocytes: 0 %
LYMPHS ABS: 2.5 10*3/uL (ref 0.7–3.1)
Lymphs: 31 %
MCH: 28.4 pg (ref 26.6–33.0)
MCHC: 32.5 g/dL (ref 31.5–35.7)
MCV: 88 fL (ref 79–97)
MONOS ABS: 0.6 10*3/uL (ref 0.1–0.9)
Monocytes: 7 %
NEUTROS ABS: 5 10*3/uL (ref 1.4–7.0)
Neutrophils: 61 %
Platelets: 431 10*3/uL — ABNORMAL HIGH (ref 150–379)
RBC: 4.5 x10E6/uL (ref 4.14–5.80)
RDW: 14 % (ref 12.3–15.4)
WBC: 8.2 10*3/uL (ref 3.4–10.8)

## 2016-11-27 LAB — NMR, LIPOPROFILE
Cholesterol: 111 mg/dL (ref 100–199)
HDL Cholesterol by NMR: 37 mg/dL — ABNORMAL LOW (ref >39)
HDL Particle Number: 31.9 umol/L (ref >=30.5)
LDL PARTICLE NUMBER: 1314 nmol/L — AB (ref <1000)
LDL SIZE: 19.9 nm (ref >20.5)
LDL-C: 50 mg/dL (ref 0–99)
LP-IR SCORE: 76 — AB (ref <=45)
SMALL LDL PARTICLE NUMBER: 1042 nmol/L — AB (ref <=527)
Triglycerides by NMR: 120 mg/dL (ref 0–149)

## 2016-11-27 LAB — BMP8+EGFR
BUN/Creatinine Ratio: 10 (ref 10–24)
BUN: 13 mg/dL (ref 8–27)
CALCIUM: 10.1 mg/dL (ref 8.6–10.2)
CO2: 21 mmol/L (ref 20–29)
CREATININE: 1.25 mg/dL (ref 0.76–1.27)
Chloride: 100 mmol/L (ref 96–106)
GFR calc Af Amer: 68 mL/min/{1.73_m2} (ref >59)
GFR calc non Af Amer: 59 mL/min/{1.73_m2} — ABNORMAL LOW (ref >59)
GLUCOSE: 153 mg/dL — AB (ref 65–99)
POTASSIUM: 5.6 mmol/L — AB (ref 3.5–5.2)
SODIUM: 139 mmol/L (ref 134–144)

## 2016-11-27 LAB — HEPATIC FUNCTION PANEL
ALK PHOS: 60 IU/L (ref 39–117)
ALT: 30 IU/L (ref 0–44)
AST: 30 IU/L (ref 0–40)
Albumin: 4.5 g/dL (ref 3.6–4.8)
Bilirubin Total: 0.4 mg/dL (ref 0.0–1.2)
Bilirubin, Direct: 0.18 mg/dL (ref 0.00–0.40)
TOTAL PROTEIN: 7 g/dL (ref 6.0–8.5)

## 2016-11-27 LAB — VITAMIN D 25 HYDROXY (VIT D DEFICIENCY, FRACTURES): Vit D, 25-Hydroxy: 45.5 ng/mL (ref 30.0–100.0)

## 2016-11-27 LAB — PSA, TOTAL AND FREE
PROSTATE SPECIFIC AG, SERUM: 1.9 ng/mL (ref 0.0–4.0)
PSA FREE PCT: 17.9 %
PSA FREE: 0.34 ng/mL

## 2016-11-27 LAB — PLEASE NOTE

## 2016-12-10 ENCOUNTER — Ambulatory Visit (INDEPENDENT_AMBULATORY_CARE_PROVIDER_SITE_OTHER): Payer: Medicare Other | Admitting: Family Medicine

## 2016-12-10 ENCOUNTER — Encounter: Payer: Self-pay | Admitting: Family Medicine

## 2016-12-10 VITALS — BP 127/71 | HR 79 | Temp 97.0°F | Ht 71.0 in | Wt 172.0 lb

## 2016-12-10 DIAGNOSIS — I700 Atherosclerosis of aorta: Secondary | ICD-10-CM | POA: Diagnosis not present

## 2016-12-10 DIAGNOSIS — E875 Hyperkalemia: Secondary | ICD-10-CM | POA: Diagnosis not present

## 2016-12-10 DIAGNOSIS — D696 Thrombocytopenia, unspecified: Secondary | ICD-10-CM | POA: Diagnosis not present

## 2016-12-10 DIAGNOSIS — Z860100 Personal history of colon polyps, unspecified: Secondary | ICD-10-CM | POA: Insufficient documentation

## 2016-12-10 DIAGNOSIS — N400 Enlarged prostate without lower urinary tract symptoms: Secondary | ICD-10-CM | POA: Diagnosis not present

## 2016-12-10 DIAGNOSIS — E559 Vitamin D deficiency, unspecified: Secondary | ICD-10-CM | POA: Diagnosis not present

## 2016-12-10 DIAGNOSIS — R911 Solitary pulmonary nodule: Secondary | ICD-10-CM | POA: Diagnosis not present

## 2016-12-10 DIAGNOSIS — R7989 Other specified abnormal findings of blood chemistry: Secondary | ICD-10-CM

## 2016-12-10 DIAGNOSIS — E119 Type 2 diabetes mellitus without complications: Secondary | ICD-10-CM | POA: Diagnosis not present

## 2016-12-10 DIAGNOSIS — I10 Essential (primary) hypertension: Secondary | ICD-10-CM

## 2016-12-10 DIAGNOSIS — E7800 Pure hypercholesterolemia, unspecified: Secondary | ICD-10-CM | POA: Diagnosis not present

## 2016-12-10 LAB — CBC WITH DIFFERENTIAL/PLATELET
Basophils Absolute: 0 10*3/uL (ref 0.0–0.2)
Basos: 0 %
EOS (ABSOLUTE): 0.1 10*3/uL (ref 0.0–0.4)
EOS: 1 %
HEMATOCRIT: 41.9 % (ref 37.5–51.0)
HEMOGLOBIN: 13.5 g/dL (ref 13.0–17.7)
IMMATURE GRANULOCYTES: 1 %
Immature Grans (Abs): 0 10*3/uL (ref 0.0–0.1)
LYMPHS: 31 %
Lymphocytes Absolute: 2.4 10*3/uL (ref 0.7–3.1)
MCH: 28.6 pg (ref 26.6–33.0)
MCHC: 32.2 g/dL (ref 31.5–35.7)
MCV: 89 fL (ref 79–97)
MONOCYTES: 7 %
Monocytes Absolute: 0.6 10*3/uL (ref 0.1–0.9)
NEUTROS PCT: 60 %
Neutrophils Absolute: 4.9 10*3/uL (ref 1.4–7.0)
Platelets: 443 10*3/uL — ABNORMAL HIGH (ref 150–379)
RBC: 4.72 x10E6/uL (ref 4.14–5.80)
RDW: 13.9 % (ref 12.3–15.4)
WBC: 8 10*3/uL (ref 3.4–10.8)

## 2016-12-10 LAB — URINALYSIS, COMPLETE
BILIRUBIN UA: NEGATIVE
Glucose, UA: NEGATIVE
Ketones, UA: NEGATIVE
Leukocytes, UA: NEGATIVE
Nitrite, UA: NEGATIVE
PH UA: 8.5 — AB (ref 5.0–7.5)
PROTEIN UA: NEGATIVE
RBC, UA: NEGATIVE
SPEC GRAV UA: 1.015 (ref 1.005–1.030)
Urobilinogen, Ur: 0.2 mg/dL (ref 0.2–1.0)

## 2016-12-10 LAB — MICROSCOPIC EXAMINATION
Bacteria, UA: NONE SEEN
EPITHELIAL CELLS (NON RENAL): NONE SEEN /HPF (ref 0–10)
RBC MICROSCOPIC, UA: NONE SEEN /HPF (ref 0–?)
Renal Epithel, UA: NONE SEEN /hpf
WBC UA: NONE SEEN /HPF (ref 0–?)

## 2016-12-10 LAB — BMP8+EGFR
BUN / CREAT RATIO: 13 (ref 10–24)
BUN: 16 mg/dL (ref 8–27)
CALCIUM: 10.1 mg/dL (ref 8.6–10.2)
CO2: 22 mmol/L (ref 20–29)
CREATININE: 1.26 mg/dL (ref 0.76–1.27)
Chloride: 95 mmol/L — ABNORMAL LOW (ref 96–106)
GFR calc Af Amer: 67 mL/min/{1.73_m2} (ref >59)
GFR calc non Af Amer: 58 mL/min/{1.73_m2} — ABNORMAL LOW (ref >59)
GLUCOSE: 130 mg/dL — AB (ref 65–99)
Potassium: 5.3 mmol/L — ABNORMAL HIGH (ref 3.5–5.2)
Sodium: 135 mmol/L (ref 134–144)

## 2016-12-10 NOTE — Progress Notes (Signed)
Subjective:    Patient ID: Matthew Daugherty, male    DOB: [birth year], 71 y.o.   MRN: [id number]  HPI Pt here for follow up and management of chronic medical problems which includes diabetes, hyperlipidemia and hypertension. He is taking medication regularly.The patient is doing well overall and has no specific complaints. His weight is down 6 pounds compared to previously and this is good his vital signs are stable. He is had blood work done and this will be reviewed with him during the visit today the total LDL particle number is elevated at 1314 and should be less than 1000. The LDL C however is good at 50. There is an incongruent see her in the advanced lipid testing carries more relevance. The triglycerides are good at 120 and the good cholesterol the HDL particle number is within normal limits. The CBC had a normal white blood cell count with a slightly decreased hemoglobin at 12.8 and the platelet count that was slightly increased at 431,000. The blood sugar was elevated at 153. The creatinine, the most important kidney function test was within normal limits and all of the electrolytes are good except the potassium was also slightly increased. The PSA was within normal limits and we will continue to monitor this yearly. The vitamin D level was good at 45.5 he should continue with current treatment all liver function tests were within normal limits. The hemoglobin A1c was stable at 6.8 and at goal of less than 7%. The patient brings in outside blood pressures for review and all of these are good except for one outlier that had a systolic reading of the 409. All of the other systolic readings were in the 120s. These will be scanned into the record. A chest CT from February was reviewed and this was a follow-up imaging for a pulmonary nodule. The reading indicated the pulmonary nodules at the lung bases or stable back to March 2017 and are considered to be benign. There is an was however a right lower lobe  solitary pulmonary nodule which had not been seen before. No follow-up was needed if the patient was at low risk. They did indicate that a noncontrast chest CT can be considered for 12 months later. There was aortic atherosclerosis and three-vessel coronary atherosclerosis. This will be reviewed again with the patient he will be given a copy of this report and we will most likely set up him to have a 9 contrast chest CT in February 2019. We also make sure that somewhere along the way he has seen the cardiologist and had some kind of stress test because of his risk factors for heart disease. The patient denies any chest pain pressure tightness or palpitations. He denies any shortness of breath. He denies any trouble with his his intestinal tract including nausea vomiting heartburn change in bowel habits blood in the stool or black tarry bowel movements. He is passing his water without problems. He did indicate that Dr. Watt Climes indicated he needed to have another colonoscopy in 5 years after the last one which should be sometime in 2020.     Patient Active Problem List   Diagnosis Date Noted  . Pulmonary nodule 07/24/2016  . Gallstone pancreatitis s/p lap cholecystectomy 06/22/2015 06/19/2015  . Diabetes mellitus type 2, controlled ([place]) 06/19/2015  . Essential hypertension 06/19/2015  . Pancreatitis 06/19/2015  . Hypothyroid 12/05/2012  . Vitamin D deficiency 12/05/2012  . Type 2 diabetes mellitus ([place]) 12/05/2012  . Hyperlipemia 08/02/2012  . Hypertension   .  Cardiac dysrhythmia, unspecified, history of   . BPH (benign prostatic hyperplasia)    Outpatient Encounter Prescriptions as of 12/10/2016  Medication Sig  . aspirin 81 MG EC tablet Take 81 mg by mouth daily.    Marland Kitchen atorvastatin (LIPITOR) 80 MG tablet TAKE 1 TABLET EVERY DAY  AT  6PM  . Cholecalciferol (VITAMIN D-3) 5000 UNITS TABS Take 1 tablet by mouth daily.    Marland Kitchen ezetimibe (ZETIA) 10 MG tablet Take 1 tablet (10 mg total) by mouth daily.  .  fenofibrate 160 MG tablet TAKE 1 TABLET EVERY DAY  . levothyroxine (SYNTHROID, LEVOTHROID) 75 MCG tablet TAKE 1 TABLET EVERY DAY BEFORE BREAKFAST  . losartan-hydrochlorothiazide (HYZAAR) 100-25 MG tablet Take 1 tablet by mouth daily.  . metFORMIN (GLUCOPHAGE) 1000 MG tablet TAKE 1 TABLET TWICE DAILY WITH MEALS  . Multiple Vitamin (MULTIVITAMIN WITH MINERALS) TABS tablet Take 1 tablet by mouth daily.   No facility-administered encounter medications on file as of 12/10/2016.      Review of Systems  Constitutional: Negative.   HENT: Negative.   Eyes: Negative.   Respiratory: Negative.   Cardiovascular: Negative.   Gastrointestinal: Negative.   Endocrine: Negative.   Genitourinary: Negative.   Musculoskeletal: Negative.   Skin: Negative.   Allergic/Immunologic: Negative.   Neurological: Negative.   Hematological: Negative.   Psychiatric/Behavioral: Negative.        Objective:   Physical Exam  Constitutional: He is oriented to person, place, and time. He appears well-developed and well-nourished. No distress.  HENT:  Head: Normocephalic and atraumatic.  Right Ear: External ear normal.  Left Ear: External ear normal.  Nose: Nose normal.  Mouth/Throat: Oropharynx is clear and moist. No oropharyngeal exudate.  Eyes: Pupils are equal, round, and reactive to light. Conjunctivae and EOM are normal. Right eye exhibits no discharge. Left eye exhibits no discharge. No scleral icterus.  The patient's next eye exam is coming up sometime in October at [facility].  Neck: Normal range of motion. Neck supple. No thyromegaly present.  No bruits thyromegaly or anterior cervical adenopathy  Cardiovascular: Normal rate, regular rhythm, normal heart sounds and intact distal pulses.   No murmur heard. The heart has a regular rate and rhythm at 72/m  Pulmonary/Chest: Effort normal and breath sounds normal. No respiratory distress. He has no wheezes. He has no rales. He exhibits no tenderness.  Clear  anteriorly and posteriorly and no axillary adenopathy  Abdominal: Soft. Bowel sounds are normal. He exhibits no mass. There is no tenderness. There is no rebound and no guarding.  No liver or spleen enlargement. No masses. No inguinal adenopathy. Normal bowel sounds without bruits.  Genitourinary: Rectum normal and penis normal.  Genitourinary Comments: The prostate was slightly enlarged but soft and smooth. There are no rectal masses. There were no inguinal hernias or testicular masses. The external genitalia were within normal limits.  Musculoskeletal: Normal range of motion. He exhibits no edema.  Lymphadenopathy:    He has no cervical adenopathy.  Neurological: He is alert and oriented to person, place, and time. He has normal reflexes. No cranial nerve deficit.  Skin: Skin is warm and dry. No rash noted.  Psychiatric: He has a normal mood and affect. His behavior is normal. Judgment and thought content normal.  Nursing note and vitals reviewed.  BP 127/71 (BP Location: Left Arm)   Pulse 79   Temp (!) 97 F (36.1 C) (Oral)   Ht _0  (1.803 m)   Wt 172 lb (78 kg)  BMI 23.99 kg/m         Assessment & Plan:  1. Type 2 diabetes mellitus without complication, without long-term current use of insulin ([facility]) The hemoglobin A1c was good at 6.8%. The patient does however make note that he is not watching his diet as closely and we will arrange has not gotten exercise that he was getting before the summertime started and he will try to do better with all of this. - Microalbumin / creatinine urine ratio - BMP8+EGFR  2. Hypertension -The blood pressure is good today and he will continue with current treatment - BMP8+EGFR  3. Pure hypercholesterolemia -The total LDL particle number was elevated as well as the small LDL particle number by advanced lipid testing. The LDL C however was good at 50. This is definitely an incongruent 7 we should rely more on the advanced lipid testing and the  patient needs to try to do better with diet exercise and he plans to do that. It is important to note that he did see the cardiologist and had a stress test which was fine as a result of the aortic atherosclerosis and coronary artery atherosclerosis.  4. Vitamin D deficiency -Continue current treatment  5. Benign prostatic hyperplasia, unspecified whether lower urinary tract symptoms present -The patient is having no symptoms with this and no problems with sexual dysfunction - Urinalysis, Complete  6. Thrombocytosis ([facility]) - CBC with Differential/Platelet  7. Aortic atherosclerosis ([place]) -Continue with aggressive therapeutic lifestyle changes exercise diet and good blood sugar control  8. Benign prostatic hyperplasia without lower urinary tract symptoms -Patient has no symptoms with this  9. Pulmonary nodule -The pulmonary nodule the you'll is in the upper part of the right lower lobe and we will plan to repeat his CT scan in February 2019.  10. Hyperkalemia -Potassium today.  11. History of colon polyps -Repeat colonoscopy in 2020  Patient Instructions                       Medicare Annual Wellness Visit  [facility] and the medical providers at [facility] strive to bring you the best medical care.  In doing so we not only want to address your current medical conditions and concerns but also to detect new conditions early and prevent illness, disease and health-related problems.    Medicare offers a yearly Wellness Visit which allows our clinical staff to assess your need for preventative services including immunizations, lifestyle education, counseling to decrease risk of preventable diseases and screening for fall risk and other medical concerns.    This visit is provided free of charge (no copay) for all Medicare recipients. The clinical pharmacists at [facility] have begun to conduct these Wellness Visits which will also include a  thorough review of all your medications.    As you primary medical provider recommend that you make an appointment for your Annual Wellness Visit if you have not done so already this year.  You may set up this appointment before you leave today or you may call back ([phone]) and schedule an appointment.  Please make sure when you call that you mention that you are scheduling your Annual Wellness Visit with the clinical pharmacist so that the appointment may be made for the proper length of time.     Continue current medications. Continue good therapeutic lifestyle changes which include good diet and exercise. Fall precautions discussed with patient. If an FOBT was given today-  please return it to our front desk. If you are over 10 years old - you may need Prevnar 3 or the adult Pneumonia vaccine.  **Flu shots are available--- please call and schedule a FLU-CLINIC appointment**  After your visit with [place] today you will receive a survey in the mail or online from [employer] regarding your care with [place]. Please take a moment to fill this out. Your feedback is very important to [place] as you can help [place] better understand your patient needs as well as improve your experience and satisfaction. WE CARE ABOUT YOU!!!   Get back in a routine with diet and exercise Drink more water Do not forget to have the colonoscopy repeated in 2020 You will need a repeat CT scan because of the pulmonary nodule in February 2019 Continue to monitor blood sugars closely and continue to check blood pressures at home Return the FOBT because of the slight drop in hemoglobin We will call with the results of the CBC and platelet count along with the potassium as soon as those results are returned.    Arrie Senate MD

## 2016-12-10 NOTE — Patient Instructions (Addendum)
Medicare Annual Wellness Visit  [facility] and the medical providers at [facility] strive to bring you the best medical care.  In doing so we not only want to address your current medical conditions and concerns but also to detect new conditions early and prevent illness, disease and health-related problems.    Medicare offers a yearly Wellness Visit which allows our clinical staff to assess your need for preventative services including immunizations, lifestyle education, counseling to decrease risk of preventable diseases and screening for fall risk and other medical concerns.    This visit is provided free of charge (no copay) for all Medicare recipients. The clinical pharmacists at [facility] have begun to conduct these Wellness Visits which will also include a thorough review of all your medications.    As you primary medical provider recommend that you make an appointment for your Annual Wellness Visit if you have not done so already this year.  You may set up this appointment before you leave today or you may call back [phone]) and schedule an appointment.  Please make sure when you call that you mention that you are scheduling your Annual Wellness Visit with the clinical pharmacist so that the appointment may be made for the proper length of time.     Continue current medications. Continue good therapeutic lifestyle changes which include good diet and exercise. Fall precautions discussed with patient. If an FOBT was given today- please return it to our front desk. If you are over 64 years old - you may need Prevnar 63 or the adult Pneumonia vaccine.  **Flu shots are available--- please call and schedule a FLU-CLINIC appointment**  After your visit with [place] today you will receive a survey in the mail or online from [employer] regarding your care with [place]. Please take a moment to fill this out. Your feedback is very  important to [place] as you can help [place] better understand your patient needs as well as improve your experience and satisfaction. WE CARE ABOUT YOU!!!   Get back in a routine with diet and exercise Drink more water Do not forget to have the colonoscopy repeated in 2020 You will need a repeat CT scan because of the pulmonary nodule in February 2019 Continue to monitor blood sugars closely and continue to check blood pressures at home Return the FOBT because of the slight drop in hemoglobin We will call with the results of the CBC and platelet count along with the potassium as soon as those results are returned.

## 2016-12-11 ENCOUNTER — Other Ambulatory Visit: Payer: Medicare Other

## 2016-12-11 DIAGNOSIS — Z1211 Encounter for screening for malignant neoplasm of colon: Secondary | ICD-10-CM

## 2016-12-11 LAB — MICROALBUMIN / CREATININE URINE RATIO: CREATININE, UR: 56.8 mg/dL

## 2016-12-11 NOTE — Addendum Note (Signed)
Addended by: Thana Ates on: 12/11/2016 02:35 PM   Modules accepted: Orders

## 2016-12-14 ENCOUNTER — Encounter: Payer: Self-pay | Admitting: *Deleted

## 2016-12-15 LAB — FECAL OCCULT BLOOD, IMMUNOCHEMICAL: Fecal Occult Bld: NEGATIVE

## 2017-01-04 ENCOUNTER — Other Ambulatory Visit: Payer: Self-pay | Admitting: Family Medicine

## 2017-01-26 ENCOUNTER — Encounter (HOSPITAL_COMMUNITY): Payer: Self-pay

## 2017-01-26 ENCOUNTER — Encounter (HOSPITAL_COMMUNITY): Payer: Medicare Other

## 2017-01-26 ENCOUNTER — Encounter (HOSPITAL_COMMUNITY): Payer: Medicare Other | Attending: Oncology | Admitting: Oncology

## 2017-01-26 DIAGNOSIS — N400 Enlarged prostate without lower urinary tract symptoms: Secondary | ICD-10-CM | POA: Diagnosis not present

## 2017-01-26 DIAGNOSIS — Z87891 Personal history of nicotine dependence: Secondary | ICD-10-CM | POA: Diagnosis not present

## 2017-01-26 DIAGNOSIS — G459 Transient cerebral ischemic attack, unspecified: Secondary | ICD-10-CM | POA: Diagnosis not present

## 2017-01-26 DIAGNOSIS — E079 Disorder of thyroid, unspecified: Secondary | ICD-10-CM | POA: Diagnosis not present

## 2017-01-26 DIAGNOSIS — I10 Essential (primary) hypertension: Secondary | ICD-10-CM | POA: Insufficient documentation

## 2017-01-26 DIAGNOSIS — E119 Type 2 diabetes mellitus without complications: Secondary | ICD-10-CM | POA: Insufficient documentation

## 2017-01-26 DIAGNOSIS — Z79899 Other long term (current) drug therapy: Secondary | ICD-10-CM | POA: Insufficient documentation

## 2017-01-26 DIAGNOSIS — E785 Hyperlipidemia, unspecified: Secondary | ICD-10-CM | POA: Insufficient documentation

## 2017-01-26 DIAGNOSIS — D473 Essential (hemorrhagic) thrombocythemia: Secondary | ICD-10-CM | POA: Diagnosis not present

## 2017-01-26 DIAGNOSIS — D75839 Thrombocytosis, unspecified: Secondary | ICD-10-CM

## 2017-01-26 DIAGNOSIS — Z7984 Long term (current) use of oral hypoglycemic drugs: Secondary | ICD-10-CM | POA: Insufficient documentation

## 2017-01-26 DIAGNOSIS — Z9049 Acquired absence of other specified parts of digestive tract: Secondary | ICD-10-CM | POA: Diagnosis not present

## 2017-01-26 DIAGNOSIS — Z7982 Long term (current) use of aspirin: Secondary | ICD-10-CM | POA: Diagnosis not present

## 2017-01-26 DIAGNOSIS — I499 Cardiac arrhythmia, unspecified: Secondary | ICD-10-CM | POA: Diagnosis not present

## 2017-01-26 LAB — CBC WITH DIFFERENTIAL/PLATELET
BASOS PCT: 0 %
Basophils Absolute: 0 10*3/uL (ref 0.0–0.1)
EOS ABS: 0 10*3/uL (ref 0.0–0.7)
Eosinophils Relative: 0 %
HEMATOCRIT: 38.8 % — AB (ref 39.0–52.0)
Hemoglobin: 13.3 g/dL (ref 13.0–17.0)
Lymphocytes Relative: 26 %
Lymphs Abs: 2.4 10*3/uL (ref 0.7–4.0)
MCH: 29.9 pg (ref 26.0–34.0)
MCHC: 34.3 g/dL (ref 30.0–36.0)
MCV: 87.2 fL (ref 78.0–100.0)
MONO ABS: 0.6 10*3/uL (ref 0.1–1.0)
MONOS PCT: 7 %
NEUTROS ABS: 6.2 10*3/uL (ref 1.7–7.7)
Neutrophils Relative %: 67 %
PLATELETS: 383 10*3/uL (ref 150–400)
RBC: 4.45 MIL/uL (ref 4.22–5.81)
RDW: 13.9 % (ref 11.5–15.5)
WBC: 9.3 10*3/uL (ref 4.0–10.5)

## 2017-01-26 LAB — IRON AND TIBC
Iron: 49 ug/dL (ref 45–182)
Saturation Ratios: 11 % — ABNORMAL LOW (ref 17.9–39.5)
TIBC: 454 ug/dL — ABNORMAL HIGH (ref 250–450)
UIBC: 405 ug/dL

## 2017-01-26 LAB — COMPREHENSIVE METABOLIC PANEL
ALK PHOS: 59 U/L (ref 38–126)
ALT: 26 U/L (ref 17–63)
AST: 29 U/L (ref 15–41)
Albumin: 4.6 g/dL (ref 3.5–5.0)
Anion gap: 12 (ref 5–15)
BUN: 20 mg/dL (ref 6–20)
CALCIUM: 9.8 mg/dL (ref 8.9–10.3)
CHLORIDE: 99 mmol/L — AB (ref 101–111)
CO2: 24 mmol/L (ref 22–32)
CREATININE: 1.26 mg/dL — AB (ref 0.61–1.24)
GFR calc Af Amer: >60 mL/min (ref >60)
GFR calc non Af Amer: 57 mL/min — ABNORMAL LOW (ref >60)
Glucose, Bld: 137 mg/dL — ABNORMAL HIGH (ref 65–99)
Potassium: 4.3 mmol/L (ref 3.5–5.1)
SODIUM: 135 mmol/L (ref 135–145)
Total Bilirubin: 0.7 mg/dL (ref 0.3–1.2)
Total Protein: 7.9 g/dL (ref 6.5–8.1)

## 2017-01-26 LAB — FERRITIN: FERRITIN: 63 ng/mL (ref 24–336)

## 2017-01-26 NOTE — Progress Notes (Signed)
[facility] Initial Visit:  Patient Care Team: Chipper Herb, MD as PCP - General (Family Medicine) Clarene Essex, MD (Gastroenterology) Minus Breeding, MD as Consulting Physician (Cardiology)  CHIEF COMPLAINTS/PURPOSE OF CONSULTATION:  Thrombocytosis  HISTORY OF PRESENTING ILLNESS: Matthew Daugherty 71 y.o. male presents today for evaluation of thrombocytosis. Review of his CBCs for the past year demonstrated his platelet count has been between 400 K-443K. His platelets have been slowly progressively increasing. His most recent CBC from 12/10/2016 demonstrated WBC 8K, hemoglobin 13.5 g/dL, hematocrit 41.9%, MCV 89, platelet count 443K, differential is normal. His baseline platelet count from 10/03/2015 was 364K. Overall patient states that he feels well. He has no complaints today. He denies any headache, blurry vision, chest pain, shortness breath, abdominal pain, changes in bowel habits, bleeding, bruising, focal weakness. His appetite is good, he denies any weight loss. He denies any history of iron deficiency due to bleeding. He denies history of splenectomy. He denies any recent infections or chronic inflammation.  Review of Systems - Oncology ROS as per HPI otherwise 12 point ROS is negative.  MEDICAL HISTORY: Past Medical History:  Diagnosis Date  . Cardiac dysrhythmia, unspecified   . Diabetes mellitus without complication ([facility])   . Essential hypertension, benign   . Hyperplasia of prostate   . Other and unspecified hyperlipidemia   . Thyroid disease   . Unspecified transient cerebral ischemia     SURGICAL HISTORY: Past Surgical History:  Procedure Laterality Date  . APPENDECTOMY    . CHOLECYSTECTOMY    . LAPAROSCOPIC CHOLECYSTECTOMY SINGLE SITE WITH INTRAOPERATIVE CHOLANGIOGRAM N/A 06/22/2015   Procedure: LAPAROSCOPIC CHOLECYSTECTOMY SINGLE SITE WITH INTRAOPERATIVE CHOLANGIOGRAM, PRIMARY UMBILICAL HERNIA REPAIR;  Surgeon: Michael Boston, MD;  Location: [facility];   Service: General;  Laterality: N/A;    SOCIAL HISTORY: Social History   Social History  . Marital status: Married    Spouse name: N/A  . Number of children: N/A  . Years of education: N/A   Occupational History  . Not on file.   Social History Main Topics  . Smoking status: Former Smoker    Packs/day: 1.00    Types: Cigarettes    Start date: 04/20/1954    Quit date: 06/13/1998  . Smokeless tobacco: Never Used  . Alcohol use No  . Drug use: No  . Sexual activity: Not on file   Other Topics Concern  . Not on file   Social History Narrative  . No narrative on file    FAMILY HISTORY Family History  Problem Relation Age of Onset  . Cancer Mother        bone  . Other Father 59       MI  . Asthma Father     ALLERGIES:  is allergic to niaspan [niacin er].  MEDICATIONS:  Current Outpatient Prescriptions  Medication Sig Dispense Refill  . aspirin 81 MG EC tablet Take 81 mg by mouth daily.      Marland Kitchen atorvastatin (LIPITOR) 80 MG tablet TAKE 1 TABLET EVERY DAY  AT  6PM 90 tablet 0  . Cholecalciferol (VITAMIN D-3) 5000 UNITS TABS Take 1 tablet by mouth daily.      Marland Kitchen ezetimibe (ZETIA) 10 MG tablet Take 1 tablet (10 mg total) by mouth daily. 90 tablet 3  . fenofibrate 160 MG tablet TAKE 1 TABLET EVERY DAY 90 tablet 0  . levothyroxine (SYNTHROID, LEVOTHROID) 75 MCG tablet TAKE 1 TABLET EVERY DAY BEFORE BREAKFAST 90 tablet 0  . losartan-hydrochlorothiazide (  HYZAAR) 100-25 MG tablet Take 1 tablet by mouth daily. 90 tablet 0  . metFORMIN (GLUCOPHAGE) 1000 MG tablet TAKE 1 TABLET TWICE DAILY WITH MEALS 180 tablet 0  . Multiple Vitamin (MULTIVITAMIN WITH MINERALS) TABS tablet Take 1 tablet by mouth daily.     No current facility-administered medications for this visit.     PHYSICAL EXAMINATION:  ECOG PERFORMANCE STATUS: 0 - Asymptomatic   Vitals:   01/26/17 0837  BP: (!) 157/80  Pulse: (!) 101  Resp: 16  SpO2: 100%    Filed Weights   01/26/17 0837  Weight: 172 lb (78  kg)     Physical Exam  Constitutional: Well-developed, well-nourished, and in no distress.   HENT:  Head: Normocephalic and atraumatic.  Mouth/Throat: No oropharyngeal exudate. Mucosa moist. Eyes: Pupils are equal, round, and reactive to light. Conjunctivae are normal. No scleral icterus.  Neck: Normal range of motion. Neck supple. No JVD present.  Cardiovascular: Normal rate, regular rhythm and normal heart sounds.  Exam reveals no gallop and no friction rub.   No murmur heard. Pulmonary/Chest: Effort normal and breath sounds normal. No respiratory distress. No wheezes.No rales.  Abdominal: Soft. Bowel sounds are normal. No distension. There is no tenderness. There is no guarding.  Musculoskeletal: No edema or tenderness.  Lymphadenopathy:    No cervical or supraclavicular adenopathy.  Neurological: Alert and oriented to person, place, and time. No cranial nerve deficit.  Skin: Skin is warm and dry. No rash noted. No erythema. No pallor.  Psychiatric: Affect and judgment normal.   LABORATORY DATA: I have personally reviewed the data as listed:  No visits with results within 1 Month(s) from this visit.  Latest known visit with results is:  Lab on 12/11/2016  Component Date Value Ref Range Status  . Fecal Occult Bld 12/11/2016 Negative  Negative Final    RADIOGRAPHIC STUDIES: I have personally reviewed the radiological images as listed and agree with the findings in the report  No results found.  ASSESSMENT: Mild thrombocytosis  PLAN: -Patient has mild normocytosis that has been slowly increasing. -Recheck CBC.  -I will rule out essential thrombocytosis by checking a JAK2 mutation with reflex to CALR/E12/MPL. -Rule out iron deficiency as a cause of his mild thrombocytosis.  RTC in 4 weeks for follow up.  Orders Placed This Encounter  Procedures  . CBC with Differential    Standing Status:   Future    Number of Occurrences:   1    Standing Expiration Date:    01/26/2018  . Comprehensive metabolic panel    Standing Status:   Future    Number of Occurrences:   1    Standing Expiration Date:   01/26/2018  . JAK2 V617F, w Reflex to CALR/E12/MPL  . Iron and TIBC    Standing Status:   Future    Number of Occurrences:   1    Standing Expiration Date:   01/26/2018  . Ferritin    Standing Status:   Future    Number of Occurrences:   1    Standing Expiration Date:   01/26/2018    All questions were answered. The patient knows to call the clinic with any problems, questions or concerns.  This note was electronically signed.    Twana First, MD  01/26/2017 9:07 AM

## 2017-01-27 ENCOUNTER — Other Ambulatory Visit: Payer: Self-pay | Admitting: Family Medicine

## 2017-01-29 DIAGNOSIS — H40033 Anatomical narrow angle, bilateral: Secondary | ICD-10-CM | POA: Diagnosis not present

## 2017-01-29 DIAGNOSIS — E119 Type 2 diabetes mellitus without complications: Secondary | ICD-10-CM | POA: Diagnosis not present

## 2017-01-29 LAB — HM DIABETES EYE EXAM

## 2017-02-09 LAB — JAK2 V617F, W REFLEX TO CALR/E12/MPL

## 2017-02-09 LAB — CALR + JAK2 E12-15 + MPL (REFLEXED)

## 2017-02-22 ENCOUNTER — Encounter (HOSPITAL_COMMUNITY): Payer: Self-pay | Admitting: Oncology

## 2017-02-22 ENCOUNTER — Encounter (HOSPITAL_COMMUNITY): Payer: Medicare Other | Attending: Oncology | Admitting: Oncology

## 2017-02-22 VITALS — BP 167/83 | HR 99 | Temp 97.9°F | Resp 18 | Wt 176.8 lb

## 2017-02-22 DIAGNOSIS — Z862 Personal history of diseases of the blood and blood-forming organs and certain disorders involving the immune mechanism: Secondary | ICD-10-CM | POA: Diagnosis not present

## 2017-02-22 DIAGNOSIS — Z79899 Other long term (current) drug therapy: Secondary | ICD-10-CM | POA: Insufficient documentation

## 2017-02-22 DIAGNOSIS — E079 Disorder of thyroid, unspecified: Secondary | ICD-10-CM | POA: Insufficient documentation

## 2017-02-22 DIAGNOSIS — E785 Hyperlipidemia, unspecified: Secondary | ICD-10-CM | POA: Insufficient documentation

## 2017-02-22 DIAGNOSIS — N400 Enlarged prostate without lower urinary tract symptoms: Secondary | ICD-10-CM | POA: Insufficient documentation

## 2017-02-22 DIAGNOSIS — Z9049 Acquired absence of other specified parts of digestive tract: Secondary | ICD-10-CM | POA: Insufficient documentation

## 2017-02-22 DIAGNOSIS — I10 Essential (primary) hypertension: Secondary | ICD-10-CM | POA: Insufficient documentation

## 2017-02-22 DIAGNOSIS — D75839 Thrombocytosis, unspecified: Secondary | ICD-10-CM

## 2017-02-22 DIAGNOSIS — G459 Transient cerebral ischemic attack, unspecified: Secondary | ICD-10-CM | POA: Insufficient documentation

## 2017-02-22 DIAGNOSIS — I499 Cardiac arrhythmia, unspecified: Secondary | ICD-10-CM | POA: Insufficient documentation

## 2017-02-22 DIAGNOSIS — Z7984 Long term (current) use of oral hypoglycemic drugs: Secondary | ICD-10-CM | POA: Insufficient documentation

## 2017-02-22 DIAGNOSIS — Z7982 Long term (current) use of aspirin: Secondary | ICD-10-CM | POA: Insufficient documentation

## 2017-02-22 DIAGNOSIS — D473 Essential (hemorrhagic) thrombocythemia: Secondary | ICD-10-CM | POA: Insufficient documentation

## 2017-02-22 DIAGNOSIS — Z87891 Personal history of nicotine dependence: Secondary | ICD-10-CM | POA: Insufficient documentation

## 2017-02-22 DIAGNOSIS — E119 Type 2 diabetes mellitus without complications: Secondary | ICD-10-CM | POA: Insufficient documentation

## 2017-02-22 NOTE — Patient Instructions (Signed)
[facility] at [facility] Discharge Instructions  RECOMMENDATIONS MADE BY THE CONSULTANT AND ANY TEST RESULTS WILL BE SENT TO YOUR REFERRING PHYSICIAN.  You were seen today by Dr. Louise Zhou    Thank you for choosing [facility] at [facility] to provide your oncology and hematology care.  To afford each patient quality time with our provider, please arrive at least 15 minutes before your scheduled appointment time.    If you have a lab appointment with the Cancer Center please come in thru the  Main Entrance and check in at the main information desk  You need to re-schedule your appointment should you arrive 10 or more minutes late.  We strive to give you quality time with our providers, and arriving late affects you and other patients whose appointments are after yours.  Also, if you no show three or more times for appointments you may be dismissed from the clinic at the providers discretion.     Again, thank you for choosing [facility].  Our hope is that these requests will decrease the amount of time that you wait before being seen by our physicians.       _____________________________________________________________  Should you have questions after your visit to [facility], please contact our office at [phone] between the hours of 8:30 a.m. and 4:30 p.m.  Voicemails left after 4:30 p.m. will not be returned until the following business day.  For prescription refill requests, have your pharmacy contact our office.       Resources For Cancer Patients and their Caregivers ? American Cancer Society: Can assist with transportation, wigs, general needs, runs Look Good Feel Better.        1-888-227-6333 ? Cancer Care: Provides financial assistance, online support groups, medication/co-pay assistance.  1-800-813-HOPE (4673) ? [facility] Assists [place] cancer patients and their  families through emotional , educational and financial support.  336-427-4357 ? [place] DSS Where to apply for food stamps, Medicaid and utility assistance. 336-342-1394 ? [facility]: Transportation to medical appointments. 336-347-2287 ? Social Security Administration: May apply for disability if have a Stage IV cancer. 336-342-7796 1-800-772-1213 ? [place] Aging, Disability and Transit Services: Assists with nutrition, care and transit needs. 336-349-2343  Cancer Center Support Programs: @10RELATIVEDAYS@ > Cancer Support Group  2nd Tuesday of the month 1pm-2pm, Journey Room  > Creative Journey  3rd Tuesday of the month 1130am-1pm, Journey Room  > Look Good Feel Better  1st Wednesday of the month 10am-12 noon, Journey Room (Call American Cancer Society to register 1-800-395-5775)    

## 2017-02-22 NOTE — Progress Notes (Signed)
[facility] Initial Visit:  Patient Care Team: Chipper Herb, MD as PCP - General (Family Medicine) Clarene Essex, MD (Gastroenterology) Minus Breeding, MD as Consulting Physician (Cardiology)  CHIEF COMPLAINTS/PURPOSE OF CONSULTATION:  Thrombocytosis  HISTORY OF PRESENTING ILLNESS: CASE Matthew Daugherty 71 y.o. male presents today for evaluation of thrombocytosis. Review of his CBCs for the past year demonstrated his platelet count has been between 400 K-443K. His platelets have been slowly progressively increasing. His most recent CBC from 12/10/2016 demonstrated WBC 8K, hemoglobin 13.5 g/dL, hematocrit 41.9%, MCV 89, platelet count 443K, differential is normal. His baseline platelet count from 10/03/2015 was 364K. Overall patient states that he feels well. He has no complaints today. He denies any headache, blurry vision, chest pain, shortness breath, abdominal pain, changes in bowel habits, bleeding, bruising, focal weakness. His appetite is good, he denies any weight loss. He denies any history of iron deficiency due to bleeding. He denies history of splenectomy. He denies any recent infections or chronic inflammation.  INTERVAL HISTORY: Patient presents today for follow-up and review of his lab workup for thrombocytosis.  JAK2 with reflex to exon 12, CALR, and MPL were all negative. Iron studies revealed a serum iron of 49, TIBC 454, Saturation ratio of 11%, ferritin 63.  Repeat CBC on 01/26/2017 demonstrated WBC 9.3K, hemoglobin 13.3 g/dL, hematocrit 38.8%, platelet count 383K.  Today patient states that he feels well and has no complaints.  Review of Systems - Oncology ROS as per HPI otherwise 12 point ROS is negative.  MEDICAL HISTORY: Past Medical History:  Diagnosis Date  . Cardiac dysrhythmia, unspecified   . Diabetes mellitus without complication ([place])   . Essential hypertension, benign   . Hyperplasia of prostate   . Other and unspecified hyperlipidemia   .  Thyroid disease   . Unspecified transient cerebral ischemia     SURGICAL HISTORY: Past Surgical History:  Procedure Laterality Date  . APPENDECTOMY    . CHOLECYSTECTOMY      SOCIAL HISTORY: Social History   Socioeconomic History  . Marital status: Married    Spouse name: Not on file  . Number of children: Not on file  . Years of education: Not on file  . Highest education level: Not on file  Social Needs  . Financial resource strain: Not on file  . Food insecurity - worry: Not on file  . Food insecurity - inability: Not on file  . Transportation needs - medical: Not on file  . Transportation needs - non-medical: Not on file  Occupational History  . Not on file  Tobacco Use  . Smoking status: Former Smoker    Packs/day: 1.00    Types: Cigarettes    Start date: 04/20/1954    Last attempt to quit: 06/13/1998    Years since quitting: 18.7  . Smokeless tobacco: Never Used  Substance and Sexual Activity  . Alcohol use: No  . Drug use: No  . Sexual activity: Not on file  Other Topics Concern  . Not on file  Social History Narrative  . Not on file    FAMILY HISTORY Family History  Problem Relation Age of Onset  . Cancer Mother        bone  . Other Father 22       MI  . Asthma Father     ALLERGIES:  is allergic to niaspan [niacin er].  MEDICATIONS:  Current Outpatient Medications  Medication Sig Dispense Refill  . aspirin 81 MG EC tablet  Take 81 mg by mouth daily.      Marland Kitchen atorvastatin (LIPITOR) 80 MG tablet TAKE 1 TABLET EVERY DAY  AT  6PM 90 tablet 0  . Cholecalciferol (VITAMIN D-3) 5000 UNITS TABS Take 1 tablet by mouth daily.      Marland Kitchen ezetimibe (ZETIA) 10 MG tablet Take 1 tablet (10 mg total) by mouth daily. 90 tablet 3  . fenofibrate 160 MG tablet TAKE 1 TABLET EVERY DAY 90 tablet 0  . levothyroxine (SYNTHROID, LEVOTHROID) 75 MCG tablet TAKE 1 TABLET EVERY DAY BEFORE BREAKFAST 90 tablet 0  . losartan-hydrochlorothiazide (HYZAAR) 100-25 MG tablet TAKE 1 TABLET  EVERY DAY 90 tablet 0  . metFORMIN (GLUCOPHAGE) 1000 MG tablet TAKE 1 TABLET TWICE DAILY WITH MEALS 180 tablet 0  . Multiple Vitamin (MULTIVITAMIN WITH MINERALS) TABS tablet Take 1 tablet by mouth daily.     No current facility-administered medications for this visit.     PHYSICAL EXAMINATION:  ECOG PERFORMANCE STATUS: 0 - Asymptomatic   Vitals:   02/22/17 1114  BP: (!) 167/83  Pulse: 99  Resp: 18  Temp: 97.9 F (36.6 C)  SpO2: 98%    Filed Weights   02/22/17 1114  Weight: 176 lb 12.8 oz (80.2 kg)     Physical Exam  Constitutional: Well-developed, well-nourished, and in no distress.   HENT:  Head: Normocephalic and atraumatic.  Mouth/Throat: No oropharyngeal exudate. Mucosa moist. Eyes: Pupils are equal, round, and reactive to light. Conjunctivae are normal. No scleral icterus.  Neck: Normal range of motion. Neck supple. No JVD present.  Cardiovascular: Normal rate, regular rhythm and normal heart sounds.  Exam reveals no gallop and no friction rub.   No murmur heard. Pulmonary/Chest: Effort normal and breath sounds normal. No respiratory distress. No wheezes.No rales.  Abdominal: Soft. Bowel sounds are normal. No distension. There is no tenderness. There is no guarding.  Musculoskeletal: No edema or tenderness.  Lymphadenopathy:    No cervical or supraclavicular adenopathy.  Neurological: Alert and oriented to person, place, and time. No cranial nerve deficit.  Skin: Skin is warm and dry. No rash noted. No erythema. No pallor.  Psychiatric: Affect and judgment normal.   LABORATORY DATA: I have personally reviewed the data as listed:  Appointment on 01/26/2017  Component Date Value Ref Range Status  . WBC 01/26/2017 9.3  4.0 - 10.5 K/uL Final  . RBC 01/26/2017 4.45  4.22 - 5.81 MIL/uL Final  . Hemoglobin 01/26/2017 13.3  13.0 - 17.0 g/dL Final  . HCT 01/26/2017 38.8* 39.0 - 52.0 % Final  . MCV 01/26/2017 87.2  78.0 - 100.0 fL Final  . MCH 01/26/2017 29.9  26.0  - 34.0 pg Final  . MCHC 01/26/2017 34.3  30.0 - 36.0 g/dL Final  . RDW 01/26/2017 13.9  11.5 - 15.5 % Final  . Platelets 01/26/2017 383  150 - 400 K/uL Final  . Neutrophils Relative % 01/26/2017 67  % Final  . Neutro Abs 01/26/2017 6.2  1.7 - 7.7 K/uL Final  . Lymphocytes Relative 01/26/2017 26  % Final  . Lymphs Abs 01/26/2017 2.4  0.7 - 4.0 K/uL Final  . Monocytes Relative 01/26/2017 7  % Final  . Monocytes Absolute 01/26/2017 0.6  0.1 - 1.0 K/uL Final  . Eosinophils Relative 01/26/2017 0  % Final  . Eosinophils Absolute 01/26/2017 0.0  0.0 - 0.7 K/uL Final  . Basophils Relative 01/26/2017 0  % Final  . Basophils Absolute 01/26/2017 0.0  0.0 - 0.1 K/uL Final  .  Sodium 01/26/2017 135  135 - 145 mmol/L Final  . Potassium 01/26/2017 4.3  3.5 - 5.1 mmol/L Final  . Chloride 01/26/2017 99* 101 - 111 mmol/L Final  . CO2 01/26/2017 24  22 - 32 mmol/L Final  . Glucose, Bld 01/26/2017 137* 65 - 99 mg/dL Final  . BUN 01/26/2017 20  6 - 20 mg/dL Final  . Creatinine, Ser 01/26/2017 1.26* 0.61 - 1.24 mg/dL Final  . Calcium 01/26/2017 9.8  8.9 - 10.3 mg/dL Final  . Total Protein 01/26/2017 7.9  6.5 - 8.1 g/dL Final  . Albumin 01/26/2017 4.6  3.5 - 5.0 g/dL Final  . AST 01/26/2017 29  15 - 41 U/L Final  . ALT 01/26/2017 26  17 - 63 U/L Final  . Alkaline Phosphatase 01/26/2017 59  38 - 126 U/L Final  . Total Bilirubin 01/26/2017 0.7  0.3 - 1.2 mg/dL Final  . GFR calc non Af Amer 01/26/2017 57* >60 mL/min Final  . GFR calc Af Amer 01/26/2017 >60  >60 mL/min Final   Comment: (NOTE) The eGFR has been calculated using the CKD EPI equation. This calculation has not been validated in all clinical situations. eGFR's persistently <60 mL/min signify possible Chronic Kidney Disease.   . Anion gap 01/26/2017 12  5 - 15 Final  . Iron 01/26/2017 49  45 - 182 ug/dL Final  . TIBC 01/26/2017 454* 250 - 450 ug/dL Final  . Saturation Ratios 01/26/2017 11* 17.9 - 39.5 % Final  . UIBC 01/26/2017 405  ug/dL  Final   Performed at [facility], [place] [address] [place], [place] [address]  . Ferritin 01/26/2017 63  24 - 336 ng/mL Final   Performed at [facility] [address], [place], [place] [address]  Office Visit on 01/26/2017  Component Date Value Ref Range Status  . JAK2 GenotypR 01/26/2017 Comment   Final   Comment: (NOTE) Result: NEGATIVE for the JAK2 V617F mutation. Interpretation:  The G to T nucleotide change encoding the V617F mutation was not detected.  This result does not rule out the presence of the JAK2 mutation at a level below the sensitivity of detection of this assay, or the presence of other mutations within JAK2 not detected by this assay.  This result does not rule out a diagnosis of polycythemia vera, essential thrombocythemia or idiopathic myelofibrosis as the V617F mutation is not detected in all patients with these disorders.   Marland Kitchen BACKGROUND: 01/26/2017 Comment   Final   Comment: (NOTE) JAK2 is a cytoplasmic tyrosine kinase with a key role in signal transduction from multiple hematopoietic growth factor receptors. A point mutation within exon 14 of the JAK2 gene ([id number]) encoding a valine to phenylalanine substitution at position 617 of the JAK2 protein (V617F) has been identified in most patients with polycythemia vera, and in about half of those with either essential thrombocythemia or idiopathic myelofibrosis. The V617F has also been detected, although infrequently, in other myeloid disorders such as chronic myelomonocytic leukemia and chronic neutrophilic luekemia. V617F is an acquired mutation that alters a highly conserved valine present in the negative regulatory JH2 domain of the JAK2 protein and is predicted to dysregulate kinase activity. Methodology: Total genomic DNA was extracted and subjected to TaqMan real-time PCR amplification/detection. Two amplification products per sample were monitored by real-time PCR using primers/probes s  pecific to JAK2 wild type (WT) and JAK2 mutant V617F. The ABI7900 Absolute Quantitation software will compare the patient specimen valuse to the standard curves and generate percent values for wild type and mutant type. In vitro studies have indicated that this assay has an analytical sensitivity of 1%. References: Baxter EJ, Scott Phineas Real, et al. Acquired mutation of the tyrosine kinase JAK2 in human myeloproliferative disorders. Lancet. 2005 Mar 19-25; 365(9464):1054-1061. Alfonso Ramus Couedic JP. A unique clonal JAK2 mutation leading to constitutive signaling causes polycythaemia vera. Nature. 2005 Apr 28; 434(7037):1144-1148. Kralovics R, Passamonti F, Buser AS, et al. A gain-of-function mutation of JAK2 in myeloproliferative disorders. N Engl J Med. 2005 Apr 28; 352(17):1779-1790.   . Director Review, JAK2 01/26/2017 Comment   Final   Comment: (NOTE) Constance Goltz, PhD, [facility]               Director, [place] for [place], [place]               [phone] This test was developed and its performance characteristics determined by [facility]. It has not been cleared or approved by the Food and Drug Administration.   Marland Kitchen REFLEX: 01/26/2017 Comment   Final   Comment: (NOTE) Reflex to CALR Mutation Analysis, JAK2 Exon 12-15 Mutation Analysis, and MPL Mutation Analysis is indicated.   Marland Kitchen Extraction 01/26/2017 Completed   Corrected   Comment: (NOTE) Performed At: [facility] [address] [place], [place] [id number] Nechama Guard MD [id number] Performed At: [facility] [place], [place] [id number] Nechama Guard MD QM:[id number]   . CALR Mutation Detection Result 01/26/2017 Comment   Final   Comment: (NOTE) NEGATIVE No insertions or deletions were detected within the analyzed region of the  calreticulin (CALR) gene. A negative result does not entirely exclude the possibility of a clonal population carrying CALR gene mutations that are not covered by this assay. Results should be interpreted in conjunction with clinical and laboratory findings for the most accurate interpretation.   . Background: 01/26/2017 Comment   Final   Comment: (NOTE) The calcium-binding endoplasmic reticulin chaperone protein, calreticulin (CALR), is somatically mutated in approximately 70% of patients with JAK2-negative essential thrombocythemia (ET) and 60- 88% of patients with JAK2-negative primary myelofibrosis(PMF). Only a minority of patients (approximately 8%) with myelodysplasia have mutations in  CALR gene. CALR mutations are rarely detected in patients with de novo acute myeloid leukemia, chronic myelogenous leukemia, lymphoid leukemia, or solid tumors. CALR mutations are not detected in polycythemia and generally appear to be mutually exclusive with JAK2 mutations and MPL mutations. The majority of mutational changes involve a variety of insertion or deletion mutations in exon 9 of the calreticulin gene: approximately 53% of all CALR mutations are a 52 bp deletion (type-1) while the second most prevalent mutation (approximately 32%) contains a 5 bp insertion (type-2). Other mutations (non-type 1 or type 2) are seen                           in a small minority of cases. CALR mutations in PMF tend to be associated with a favorable prognosis compared to JAK2  V617F mutations, whereas primary myelofibrosis negative for CALR, JAK2 V617F and MPL mutations (so-called triple negative) is associated with a poor prognosis and shorter survival. The detection of a CALR gene mutation aids in the specific diagnosis of a myeloproliferative neoplasm, and help distinguish this clonal disease from a benign reactive process.   . Methodology: 01/26/2017 Comment   Final   Comment: (NOTE) Genomic DNA was  isolated from the provided specimen. Polymerase chain reaction (PCR) of exon 9 of the CALR gene was performed with specific fluorescent-labeled primers, and the PCR product was analyzed by capillary gel electrophoresis to determine the size of the PCR products. This PCR assay is capable of detecting a mutant cell population with a sensitivity of 5 mutant cells per 100 normal cells. A negative result does not exclude the presence of a myeloproliferative disorder or other neoplastic process. This test was developed and its performance characteristics determined by [facility]. It has not been cleared or approved by the Food and Drug Administration. The FDA has determined that such clearance or approval is not necessary.   . References: 01/26/2017 Comment   Final   Comment: (NOTE) 1. Klampfel, T. et al. (2013) Somatic mutations of calreticulin in   myeloproliferative neoplasms. New Engl. J. Med. 101:7510-2585. 2. Haynes Kerns et al. (2013) Somatic CALR mutations in   myeloproliferative neoplasms with nonmutated JAK2. New Engl. J.   Med. [phone].   Marland Kitchen Director Review 01/26/2017 Comment   Final   Comment: (NOTE) Constance Goltz, PhD, [facility]               Director, [place] for [place] and [place], [place]   . JAK2 Exons 12-15 Mut Det PCR: 01/26/2017 Comment   Final   Comment: (NOTE) NEGATIVE JAK2 mutations were not detected in exons 12, 13, 14 and 15. This result does not rule out the presence of JAK2 mutation at a level below the detection sensitivity of this assay, the presence of other mutations outside the analyzed region of the JAK2 gene, or the presence of a myeloproliferative or other neoplasm. Result must be correlated with other clinical data for the most accurate diagnosis.   . Indications 01/26/2017 [place]   Final  . Specimen Type 01/26/2017 Comment    Final   No specimen type provided.  Marland Kitchen BACKGROUND: 01/26/2017 Comment   Final   Comment: (NOTE) JAK2 V617F mutation is detected in patients with polycythemia vera (95%), essential thrombocythemia (50%) and primary myelofibrosis (50%). A small percentage of JAK2 mutation positive patients (3.3%) contain other non-V617F mutations within exons 12 to 15. The detection of a JAK2 gene mutation aids in the specific diagnosis of a myeloproliferative neoplasm, and help distinguish this clonal disease from a benign reactive process.   . Method 01/26/2017 Comment   Final   Comment: (NOTE) Total RNA was purified from the provided specimen. The JAK2 gene region covering exons 12 to 15 was subjected to reverse- transcription coupled PCR amplification, and bi-directional sequencing to identify sequence variations. This assay has a sensitivity to detect approximately 15% population of cells containing the JAK2 mutations in a background of non-mutant cells. This test was  developed and its performance characteristics determined by [facility]. It has not been cleared or approved by the Food and Drug Administration.   . References 01/26/2017 Comment   Final   Comment: (NOTE) Algasham, N. et al. Detection of mutations in JAK2 exons 12-15 by Sanger sequencing. Int J Lab Hemato. 2015, 38:34-41. Joelene Millin al. Mutation profile of JAK2 transcripts in patients with chronic myeloproliferative neoplasias. J Mol Diagn. 2009, 11:49-53.   Marland Kitchen DIRECTOR REVIEW: 01/26/2017 Comment   Final   Comment: (NOTE) Loni Muse, PhD  Director, [place] [facility]  Research [place], [place] [address]  [phone]   . MPL MUTATION ANALYSIS RESULT: 01/26/2017 Comment   Final   Comment: (NOTE) No MPL mutation was identified in the provided specimen of this individual. Results should be interpreted in conjunction with clinical and other laboratory findings for the most  accurate interpretation.   Marland Kitchen BACKGROUND: 01/26/2017 Comment   Final   Comment: (NOTE) MPL (myeloproliferative leukemia virus oncogene homology) belongs to the hematopoietin superfamily and enables its ligand thrombopoietin to facilitate both global hematopoiesis and megakaryocyte growth and differentiation. MPL W515 mutations are present in patients with primary myelofibrosis (PMF) and essential thrombocythemia (ET) at a frequency of approximately 5% and 1% respectively. The S505 mutation is detected in patients with hereditary thrombocythemia.   Marland Kitchen METHODOLOGY: 01/26/2017 Comment   Final   Comment: (NOTE) Genomic DNA was purified from the provided specimen. MPL gene region covering the S505N and W515L/K mutations were subjected to PCR amplification and bi-directional sequencing in duplicate to identify sequence variations. This assay has a sensitivity to detect approximately 20-25% population of cells containing the MPL mutations in a background of non-mutant cells. This assay will not detect the mutation below the sensitivity of this assay. Molecular- based testing is highly accurate, but as in any laboratory test, rare diagnostic errors may occur.   Marland Kitchen REFERENCES: 01/26/2017 Comment   Final   Comment: (NOTE) 1. Pardanani AD, et al. (2006). MPL515 mutations in   myeloproliferative and other myeloid disorders: a study   of 1182 patients. Blood 696:2952-8413. 2. Andre Lefort and Levine RL. (2008). JAK2 and MPL   mutations in myeloproliferative neoplasms: discovery and   science. Leukemia 22:1813-1817. 3. Juline Patch, et al. (2009). Evidence for a founder effect   of the MPL-S505N mutation in eight [place] pedigrees with   hereditary thrombocythemia. Haematologica 94(10):1368-   2440.   Marland Kitchen DIRECTOR REVIEW: 01/26/2017 Comment   Final   Comment: (NOTE) Loni Muse, PhD  Director, [place] for [facility] and [place], [place]  [address]  [phone] This test was developed and its performance characteristics determined by [facility]. It has not been cleared or approved by the Food and Drug Administration.   . Extraction 01/26/2017 Comment   Final   Comment: (NOTE) This sample has been received and DNA extraction has been performed. Performed At: [facility] [address] [place], [place] [id number] Nechama Guard MD IE:[id number] Performed At: [facility] [place], [place] [id number] Nechama Guard MD FU:[id number]     RADIOGRAPHIC STUDIES: I have personally reviewed the radiological images as listed and agree with the findings in the report  No results found.  ASSESSMENT: Mild thrombocytosis  PLAN: -Reviewed patients labs with him in detail. He has been ruled out for essential thrombocytosis with a negative JAK2 mutation with reflex to CALR/E12/MPL. -His platelet count has returned  back to normal. I believe his mild thrombocytosis may be due to mild iron deficiency. I have advised him to take an OTC oral iron supplementation daily. He verbalized understanding. He will follow up with his PCP regarding monitoring of his iron levels in the future. He has an upcoming appointment with Dr. Laurance Flatten in December. -RTC PRN.  All questions were answered. The patient knows to call the clinic with any problems, questions or concerns.  This note was electronically signed.    Twana First, MD  02/22/2017 11:26 AM

## 2017-03-09 ENCOUNTER — Other Ambulatory Visit: Payer: Self-pay | Admitting: Family Medicine

## 2017-03-31 ENCOUNTER — Other Ambulatory Visit: Payer: Self-pay | Admitting: Family Medicine

## 2017-04-01 ENCOUNTER — Other Ambulatory Visit: Payer: Medicare Other

## 2017-04-01 DIAGNOSIS — E7800 Pure hypercholesterolemia, unspecified: Secondary | ICD-10-CM

## 2017-04-01 DIAGNOSIS — I700 Atherosclerosis of aorta: Secondary | ICD-10-CM | POA: Diagnosis not present

## 2017-04-01 DIAGNOSIS — D696 Thrombocytopenia, unspecified: Secondary | ICD-10-CM

## 2017-04-01 DIAGNOSIS — N400 Enlarged prostate without lower urinary tract symptoms: Secondary | ICD-10-CM

## 2017-04-01 DIAGNOSIS — E559 Vitamin D deficiency, unspecified: Secondary | ICD-10-CM | POA: Diagnosis not present

## 2017-04-01 DIAGNOSIS — E119 Type 2 diabetes mellitus without complications: Secondary | ICD-10-CM

## 2017-04-01 DIAGNOSIS — I10 Essential (primary) hypertension: Secondary | ICD-10-CM | POA: Diagnosis not present

## 2017-04-01 LAB — BAYER DCA HB A1C WAIVED: HB A1C (BAYER DCA - WAIVED): 6.9 % (ref <7.0)

## 2017-04-02 ENCOUNTER — Encounter: Payer: Self-pay | Admitting: Family Medicine

## 2017-04-02 DIAGNOSIS — D509 Iron deficiency anemia, unspecified: Secondary | ICD-10-CM | POA: Insufficient documentation

## 2017-04-02 LAB — CBC WITH DIFFERENTIAL/PLATELET
BASOS ABS: 0 10*3/uL (ref 0.0–0.2)
Basos: 0 %
EOS (ABSOLUTE): 0.1 10*3/uL (ref 0.0–0.4)
Eos: 1 %
Hematocrit: 37.6 % (ref 37.5–51.0)
Hemoglobin: 12.5 g/dL — ABNORMAL LOW (ref 13.0–17.7)
Immature Grans (Abs): 0 10*3/uL (ref 0.0–0.1)
Immature Granulocytes: 0 %
LYMPHS ABS: 2.3 10*3/uL (ref 0.7–3.1)
Lymphs: 33 %
MCH: 28.8 pg (ref 26.6–33.0)
MCHC: 33.2 g/dL (ref 31.5–35.7)
MCV: 87 fL (ref 79–97)
MONOCYTES: 7 %
MONOS ABS: 0.5 10*3/uL (ref 0.1–0.9)
NEUTROS PCT: 59 %
Neutrophils Absolute: 4.1 10*3/uL (ref 1.4–7.0)
Platelets: 428 10*3/uL — ABNORMAL HIGH (ref 150–379)
RBC: 4.34 x10E6/uL (ref 4.14–5.80)
RDW: 13.9 % (ref 12.3–15.4)
WBC: 7 10*3/uL (ref 3.4–10.8)

## 2017-04-02 LAB — BMP8+EGFR
BUN / CREAT RATIO: 9 — AB (ref 10–24)
BUN: 11 mg/dL (ref 8–27)
CHLORIDE: 100 mmol/L (ref 96–106)
CO2: 25 mmol/L (ref 20–29)
Calcium: 10.4 mg/dL — ABNORMAL HIGH (ref 8.6–10.2)
Creatinine, Ser: 1.2 mg/dL (ref 0.76–1.27)
GFR calc Af Amer: 71 mL/min/{1.73_m2} (ref >59)
GFR calc non Af Amer: 62 mL/min/{1.73_m2} (ref >59)
GLUCOSE: 145 mg/dL — AB (ref 65–99)
Potassium: 5 mmol/L (ref 3.5–5.2)
SODIUM: 141 mmol/L (ref 134–144)

## 2017-04-02 LAB — LIPID PANEL
CHOLESTEROL TOTAL: 110 mg/dL (ref 100–199)
Chol/HDL Ratio: 2.9 ratio (ref 0.0–5.0)
HDL: 38 mg/dL — ABNORMAL LOW (ref >39)
LDL Calculated: 55 mg/dL (ref 0–99)
TRIGLYCERIDES: 84 mg/dL (ref 0–149)
VLDL Cholesterol Cal: 17 mg/dL (ref 5–40)

## 2017-04-02 LAB — HEPATIC FUNCTION PANEL
ALBUMIN: 4.7 g/dL (ref 3.6–4.8)
ALK PHOS: 52 IU/L (ref 39–117)
ALT: 30 IU/L (ref 0–44)
AST: 29 IU/L (ref 0–40)
BILIRUBIN TOTAL: 0.4 mg/dL (ref 0.0–1.2)
BILIRUBIN, DIRECT: 0.22 mg/dL (ref 0.00–0.40)
Total Protein: 7 g/dL (ref 6.0–8.5)

## 2017-04-02 LAB — VITAMIN D 25 HYDROXY (VIT D DEFICIENCY, FRACTURES): Vit D, 25-Hydroxy: 42.9 ng/mL (ref 30.0–100.0)

## 2017-04-03 LAB — FERRITIN: Ferritin: 100 ng/mL (ref 30–400)

## 2017-04-03 LAB — SPECIMEN STATUS REPORT

## 2017-04-03 LAB — IRON AND TIBC
IRON: 53 ug/dL (ref 38–169)
Iron Saturation: 14 % — ABNORMAL LOW (ref 15–55)
Total Iron Binding Capacity: 370 ug/dL (ref 250–450)
UIBC: 317 ug/dL (ref 111–343)

## 2017-04-05 ENCOUNTER — Other Ambulatory Visit: Payer: Self-pay | Admitting: *Deleted

## 2017-04-05 MED ORDER — INTEGRA PLUS PO CAPS: 1.0000 | ORAL_CAPSULE | Freq: Every day | ORAL | 3 refills | Status: DC

## 2017-04-15 ENCOUNTER — Ambulatory Visit: Payer: Medicare Other | Admitting: Family Medicine

## 2017-04-30 ENCOUNTER — Ambulatory Visit (INDEPENDENT_AMBULATORY_CARE_PROVIDER_SITE_OTHER): Payer: Medicare Other | Admitting: Family Medicine

## 2017-04-30 ENCOUNTER — Encounter: Payer: Self-pay | Admitting: Family Medicine

## 2017-04-30 VITALS — BP 133/74 | HR 88 | Temp 97.2°F | Ht 70.0 in | Wt 168.0 lb

## 2017-04-30 DIAGNOSIS — D696 Thrombocytopenia, unspecified: Secondary | ICD-10-CM

## 2017-04-30 DIAGNOSIS — D508 Other iron deficiency anemias: Secondary | ICD-10-CM

## 2017-04-30 DIAGNOSIS — E559 Vitamin D deficiency, unspecified: Secondary | ICD-10-CM

## 2017-04-30 DIAGNOSIS — I10 Essential (primary) hypertension: Secondary | ICD-10-CM

## 2017-04-30 DIAGNOSIS — I700 Atherosclerosis of aorta: Secondary | ICD-10-CM

## 2017-04-30 DIAGNOSIS — E119 Type 2 diabetes mellitus without complications: Secondary | ICD-10-CM | POA: Diagnosis not present

## 2017-04-30 DIAGNOSIS — Z23 Encounter for immunization: Secondary | ICD-10-CM

## 2017-04-30 DIAGNOSIS — E7800 Pure hypercholesterolemia, unspecified: Secondary | ICD-10-CM | POA: Diagnosis not present

## 2017-04-30 DIAGNOSIS — N400 Enlarged prostate without lower urinary tract symptoms: Secondary | ICD-10-CM | POA: Diagnosis not present

## 2017-04-30 DIAGNOSIS — R911 Solitary pulmonary nodule: Secondary | ICD-10-CM

## 2017-04-30 DIAGNOSIS — R9389 Abnormal findings on diagnostic imaging of other specified body structures: Secondary | ICD-10-CM

## 2017-04-30 NOTE — Patient Instructions (Addendum)
Medicare Annual Wellness Visit  [facility] and the medical providers at [facility] strive to bring you the best medical care.  In doing so we not only want to address your current medical conditions and concerns but also to detect new conditions early and prevent illness, disease and health-related problems.    Medicare offers a yearly Wellness Visit which allows our clinical staff to assess your need for preventative services including immunizations, lifestyle education, counseling to decrease risk of preventable diseases and screening for fall risk and other medical concerns.    This visit is provided free of charge (no copay) for all Medicare recipients. The clinical pharmacists at [facility] have begun to conduct these Wellness Visits which will also include a thorough review of all your medications.    As you primary medical provider recommend that you make an appointment for your Annual Wellness Visit if you have not done so already this year.  You may set up this appointment before you leave today or you may call back ([phone]) and schedule an appointment.  Please make sure when you call that you mention that you are scheduling your Annual Wellness Visit with the clinical pharmacist so that the appointment may be made for the proper length of time.     Continue current medications. Continue good therapeutic lifestyle changes which include good diet and exercise. Fall precautions discussed with patient. If an FOBT was given today- please return it to our front desk. If you are over 22 years old - you may need Prevnar 54 or the adult Pneumonia vaccine.  **Flu shots are available--- please call and schedule a FLU-CLINIC appointment**  After your visit with [place] today you will receive a survey in the mail or online from [employer] regarding your care with [place]. Please take a moment to fill this out. Your feedback is very  important to [place] as you can help [place] better understand your patient needs as well as improve your experience and satisfaction. WE CARE ABOUT YOU!!!   Continue to take the iron medication as prescribed and we will check another CBC at the next visit We will also arrange for you to have a repeat CT scan of the chest sometime in February because of the abnormality found a year ago. Continue to eat healthy and stay active physically and check blood sugars regularly.

## 2017-04-30 NOTE — Progress Notes (Signed)
Subjective:    Patient ID: Matthew Daugherty, male    DOB: [birth year], 72 y.o.   MRN: [id number]  HPI Pt here for follow up and management of chronic medical problems which includes diabetes, hypertension and hyperlipidemia. He is taking medication regularly.  The patient is doing well overall and has no specific requesting any refills today.  His blood work has been done.  This will be reviewed with him during the visit today.  His cholesterol numbers with traditional lipid testing are all excellent and at goal except the good cholesterol is low.  The blood sugar was elevated at 145 with a normal creatinine and normal electrolytes except for a slightly elevated serum calcium.  The CBC had a normal white blood cell count with a hemoglobin that remains slightly decreased at 12.5.  Platelet count was increased this time and has been increase in the past.  We will continue to monitor this.  The vitamin D level was good at 42.9.  All liver function tests were normal.  The iron saturation was slightly decreased and we will therefore continue him on his current iron replacement.  The serum iron level was within normal limits.  Ferritin level was also within normal.  The hemoglobin A1c was good at 6.9%.  The patient is doing well with no specific complaints.  He denies chest pain shortness of breath trouble swallowing heartburn nausea vomiting diarrhea or blood in the stool.  His next colonoscopy is due in 2020 and he is aware of this.  This is with Dr. May God.  He is passing his water without problems.  He had his last eye exam in October at [place].  We did review the CT scan findings and a solid nodule was noted and it was recommended that he would have a repeat CT scan in 1 year from the previous one would which would be sometime in February of this year.  He agrees to get this done.     Patient Active Problem List   Diagnosis Date Noted  . Anemia, iron deficiency 04/02/2017  . Thrombocytosis ([place])  01/26/2017  . History of colon polyps 12/10/2016  . Pulmonary nodule 07/24/2016  . Gallstone pancreatitis s/p lap cholecystectomy 06/22/2015 06/19/2015  . Diabetes mellitus type 2, controlled ([place]) 06/19/2015  . Essential hypertension 06/19/2015  . Pancreatitis 06/19/2015  . Hypothyroid 12/05/2012  . Vitamin D deficiency 12/05/2012  . Type 2 diabetes mellitus ([place]) 12/05/2012  . Hyperlipemia 08/02/2012  . Hypertension   . Cardiac dysrhythmia, unspecified, history of   . BPH (benign prostatic hyperplasia)    Outpatient Encounter Medications as of 04/30/2017  Medication Sig  . aspirin 81 MG EC tablet Take 81 mg by mouth daily.    Marland Kitchen atorvastatin (LIPITOR) 80 MG tablet TAKE 1 TABLET EVERY DAY  AT  6PM  . Cholecalciferol (VITAMIN D-3) 5000 UNITS TABS Take 1 tablet by mouth daily.    Marland Kitchen ezetimibe (ZETIA) 10 MG tablet Take 1 tablet (10 mg total) by mouth daily.  Marland Kitchen FeFum-FePoly-FA-B Cmp-C-Biot (INTEGRA PLUS) CAPS Take 1 capsule by mouth daily.  . fenofibrate 160 MG tablet TAKE 1 TABLET EVERY DAY  . levothyroxine (SYNTHROID, LEVOTHROID) 75 MCG tablet TAKE 1 TABLET EVERY DAY BEFORE BREAKFAST  . losartan-hydrochlorothiazide (HYZAAR) 100-25 MG tablet TAKE 1 TABLET EVERY DAY  . metFORMIN (GLUCOPHAGE) 1000 MG tablet TAKE 1 TABLET TWICE DAILY WITH MEALS  . Multiple Vitamin (MULTIVITAMIN WITH MINERALS) TABS tablet Take 1 tablet by mouth daily.   No  facility-administered encounter medications on file as of 04/30/2017.      Review of Systems  Constitutional: Negative.   HENT: Negative.   Eyes: Negative.   Respiratory: Negative.   Cardiovascular: Negative.   Gastrointestinal: Negative.   Endocrine: Negative.   Genitourinary: Negative.   Musculoskeletal: Negative.   Skin: Negative.   Allergic/Immunologic: Negative.   Neurological: Negative.   Hematological: Negative.   Psychiatric/Behavioral: Negative.        Objective:   Physical Exam  Constitutional: He is oriented to person, place, and  time. He appears well-developed and well-nourished.  The patient is pleasant and alert and trying to do well with maintaining a healthy lifestyle.  HENT:  Head: Normocephalic and atraumatic.  Right Ear: External ear normal.  Left Ear: External ear normal.  Mouth/Throat: Oropharynx is clear and moist. No oropharyngeal exudate.  Slight nasal congestion left greater than right  Eyes: Conjunctivae and EOM are normal. Pupils are equal, round, and reactive to light. Right eye exhibits no discharge. Left eye exhibits no discharge. No scleral icterus.  Eye exam was done in October at [facility]  Neck: Normal range of motion. Neck supple. No thyromegaly present.  No bruits thyromegaly or anterior cervical adenopathy  Cardiovascular: Normal rate, regular rhythm, normal heart sounds and intact distal pulses.  No murmur heard. Heart is regular at 60/min  Pulmonary/Chest: Effort normal and breath sounds normal. No respiratory distress. He has no wheezes. He has no rales. He exhibits no tenderness.  Lungs are clear anteriorly and posteriorly with no axillary adenopathy  Abdominal: Soft. Bowel sounds are normal. He exhibits no mass. There is no tenderness. There is no rebound and no guarding.  The abdomen is soft without masses tenderness or organ enlargement or bruits  Musculoskeletal: Normal range of motion. He exhibits no edema.  Lymphadenopathy:    He has no cervical adenopathy.  Neurological: He is alert and oriented to person, place, and time. He has normal reflexes. No cranial nerve deficit.  Skin: Skin is warm and dry. No rash noted.  Psychiatric: He has a normal mood and affect. His behavior is normal. Judgment and thought content normal.  Nursing note and vitals reviewed.   BP 133/74 (BP Location: Left Arm)   Pulse 88   Temp (!) 97.2 F (36.2 C) (Oral)   Ht 5\' 10"  (1.778 m)   Wt 168 lb (76.2 kg)   BMI 24.11 kg/m        Assessment & Plan:  1. Type 2 diabetes mellitus without  complication, without long-term current use of insulin (HCC) -Continue current treatment and aggressive therapeutic lifestyle changes with good hemoglobin A1c and at goal.  2. Hypertension -Continue current treatment and sodium restriction  3. Pure hypercholesterolemia -Continue current treatment and aggressive therapeutic lifestyle changes  4. Vitamin D deficiency -Continue with vitamin D replacement  5. Benign prostatic hyperplasia, unspecified whether lower urinary tract symptoms present -No complaints with voiding today  6. Thrombocytosis -Take iron medication and recheck CBC at next visit  7. Aortic atherosclerosis ([place]) -Continue aggressive therapeutic lifestyle changes and current cholesterol treatment  8. Iron deficiency anemia secondary to inadequate dietary iron intake -Continue with iron replacement and check CBC at next visit along with ferritin levels  9. Abnormal chest CT -Get repeat CT of chest in February as recommended by radiology  Patient Instructions                       Medicare Annual Wellness Visit  [facility] and the medical providers at [facility] strive to bring you the best medical care.  In doing so we not only want to address your current medical conditions and concerns but also to detect new conditions early and prevent illness, disease and health-related problems.    Medicare offers a yearly Wellness Visit which allows our clinical staff to assess your need for preventative services including immunizations, lifestyle education, counseling to decrease risk of preventable diseases and screening for fall risk and other medical concerns.    This visit is provided free of charge (no copay) for all Medicare recipients. The clinical pharmacists at [facility] have begun to conduct these Wellness Visits which will also include a thorough review of all your medications.    As you primary medical provider  recommend that you make an appointment for your Annual Wellness Visit if you have not done so already this year.  You may set up this appointment before you leave today or you may call back ([phone]) and schedule an appointment.  Please make sure when you call that you mention that you are scheduling your Annual Wellness Visit with the clinical pharmacist so that the appointment may be made for the proper length of time.     Continue current medications. Continue good therapeutic lifestyle changes which include good diet and exercise. Fall precautions discussed with patient. If an FOBT was given today- please return it to our front desk. If you are over 1 years old - you may need Prevnar 44 or the adult Pneumonia vaccine.  **Flu shots are available--- please call and schedule a FLU-CLINIC appointment**  After your visit with [place] today you will receive a survey in the mail or online from [employer] regarding your care with [place]. Please take a moment to fill this out. Your feedback is very important to [place] as you can help [place] better understand your patient needs as well as improve your experience and satisfaction. WE CARE ABOUT YOU!!!   Continue to take the iron medication as prescribed and we will check another CBC at the next visit We will also arrange for you to have a repeat CT scan of the chest sometime in February because of the abnormality found a year ago. Continue to eat healthy and stay active physically and check blood sugars regularly.    Arrie Senate MD

## 2017-05-07 ENCOUNTER — Ambulatory Visit (HOSPITAL_COMMUNITY)
Admission: RE | Admit: 2017-05-07 | Discharge: 2017-05-07 | Disposition: A | Discharge status: Home / Self Care | Payer: Medicare Other | Source: Ambulatory Visit | Attending: Family Medicine | Admitting: Family Medicine

## 2017-05-07 DIAGNOSIS — I700 Atherosclerosis of aorta: Secondary | ICD-10-CM | POA: Insufficient documentation

## 2017-05-07 DIAGNOSIS — R918 Other nonspecific abnormal finding of lung field: Secondary | ICD-10-CM | POA: Insufficient documentation

## 2017-05-07 DIAGNOSIS — R911 Solitary pulmonary nodule: Secondary | ICD-10-CM | POA: Insufficient documentation

## 2017-05-14 ENCOUNTER — Other Ambulatory Visit: Payer: Self-pay | Admitting: Family Medicine

## 2017-05-17 NOTE — Telephone Encounter (Signed)
Last seen 04/30/17  Last thyroid 11/14/15

## 2017-05-25 ENCOUNTER — Ambulatory Visit (INDEPENDENT_AMBULATORY_CARE_PROVIDER_SITE_OTHER): Payer: Medicare Other | Admitting: *Deleted

## 2017-05-25 VITALS — BP 115/74 | HR 84 | Temp 96.6°F | Ht 70.0 in | Wt 169.0 lb

## 2017-05-25 DIAGNOSIS — Z0000 Encounter for general adult medical examination without abnormal findings: Secondary | ICD-10-CM | POA: Diagnosis not present

## 2017-05-25 NOTE — Progress Notes (Signed)
Subjective:   Matthew Daugherty is a 72 y.o. male who presents for Medicare Annual/Subsequent preventive examination. He is retired from [employer]. His hobbies include yard work, gardening, Marine scientist and traveling. He walks and works outside for exercise. He states that his diet is semi-healthy and he gets in 3 meals a day. He is involved in his church and he lives at home with his wife, Matthew Daugherty. He has one son that lives next to him. He has one cat and fall hazards were discussed at todays visit. He states that his health is about the same as it was a year ago.   Cardiac Risk Factors include: advanced age (>13men, >65 women);male gender;hypertension;diabetes mellitus     Objective:    Vitals: BP 115/74 (BP Location: Right Arm)   Pulse 84   Temp (!) 96.6 F (35.9 C) (Oral)   Ht 5\' 10"  (1.778 m)   Wt 169 lb (76.7 kg)   BMI 24.25 kg/m   Body mass index is 24.25 kg/m.  Advanced Directives 05/25/2017 02/22/2017 01/26/2017 12/27/2015 06/20/2015  Does Patient Have a Medical Advance Directive? No Yes Yes Yes No  Type of Advance Directive - Healthcare Power of [place] -  Does patient want to make changes to medical advance directive? - No - Patient declined No - Patient declined No - Patient declined -  Copy of [place] in Chart? - No - copy requested No - copy requested No - copy requested -  Would patient like information on creating a medical advance directive? Yes (MAU/Ambulatory/Procedural Areas - Information given) - - - -    Tobacco Social History   Tobacco Use  Smoking Status Former Smoker  . Packs/day: 1.00  . Types: Cigarettes  . Start date: 04/20/1954  . Last attempt to quit: 06/13/1998  . Years since quitting: 18.9  Smokeless Tobacco Never Used     Counseling given: Not Answered   Clinical Intake:                       Past Medical History:  Diagnosis Date  . Cardiac dysrhythmia, unspecified    . Diabetes mellitus without complication ([place])   . Essential hypertension, benign   . Hyperplasia of prostate   . Other and unspecified hyperlipidemia   . Thyroid disease   . Unspecified transient cerebral ischemia    Past Surgical History:  Procedure Laterality Date  . APPENDECTOMY    . CHOLECYSTECTOMY    . LAPAROSCOPIC CHOLECYSTECTOMY SINGLE SITE WITH INTRAOPERATIVE CHOLANGIOGRAM N/A 06/22/2015   Procedure: LAPAROSCOPIC CHOLECYSTECTOMY SINGLE SITE WITH INTRAOPERATIVE CHOLANGIOGRAM, PRIMARY UMBILICAL HERNIA REPAIR;  Surgeon: Michael Boston, MD;  Location: [facility];  Service: General;  Laterality: N/A;   Family History  Problem Relation Age of Onset  . Cancer Mother        bone  . Other Father 60       MI  . Asthma Father    Social History   Socioeconomic History  . Marital status: Married    Spouse name: Matthew Daugherty  . Number of children: 1  . Years of education: None  . Highest education level: None  Social Needs  . Financial resource strain: None  . Food insecurity - worry: None  . Food insecurity - inability: None  . Transportation needs - medical: None  . Transportation needs - non-medical: None  Occupational History  . Occupation: retired    Fish farm manager: [employer]  Tobacco Use  . Smoking status: Former Smoker    Packs/day: 1.00    Types: Cigarettes    Start date: 04/20/1954    Last attempt to quit: 06/13/1998    Years since quitting: 18.9  . Smokeless tobacco: Never Used  Substance and Sexual Activity  . Alcohol use: No  . Drug use: No  . Sexual activity: None  Other Topics Concern  . None  Social History Narrative  . None    Outpatient Encounter Medications as of 05/25/2017  Medication Sig  . aspirin 81 MG EC tablet Take 81 mg by mouth daily.    Marland Kitchen atorvastatin (LIPITOR) 80 MG tablet TAKE 1 TABLET EVERY DAY  AT  6PM  . Cholecalciferol (VITAMIN D-3) 5000 UNITS TABS Take 1 tablet by mouth daily.    Marland Kitchen ezetimibe (ZETIA) 10 MG tablet Take 1 tablet (10 mg total) by mouth  daily.  Marland Kitchen FeFum-FePoly-FA-B Cmp-C-Biot (INTEGRA PLUS) CAPS Take 1 capsule by mouth daily.  . fenofibrate 160 MG tablet TAKE 1 TABLET EVERY DAY  . levothyroxine (SYNTHROID, LEVOTHROID) 75 MCG tablet TAKE 1 TABLET EVERY DAY BEFORE BREAKFAST  . losartan-hydrochlorothiazide (HYZAAR) 100-25 MG tablet TAKE 1 TABLET EVERY DAY  . metFORMIN (GLUCOPHAGE) 1000 MG tablet TAKE 1 TABLET TWICE DAILY WITH MEALS  . Multiple Vitamin (MULTIVITAMIN WITH MINERALS) TABS tablet Take 1 tablet by mouth daily.   No facility-administered encounter medications on file as of 05/25/2017.     Activities of Daily Living In your present state of health, do you have any difficulty performing the following activities: 05/25/2017  Hearing? N  Vision? Y  Comment rx glasses   Difficulty concentrating or making decisions? N  Walking or climbing stairs? N  Dressing or bathing? N  Doing errands, shopping? N  Preparing Food and eating ? N  Using the Toilet? N  In the past six months, have you accidently leaked urine? N  Do you have problems with loss of bowel control? N  Managing your Medications? N  Managing your Finances? N  Housekeeping or managing your Housekeeping? N  Some recent data might be hidden    Patient Care Team: Chipper Herb, MD as PCP - General (Family Medicine) Clarene Essex, MD (Gastroenterology) Minus Breeding, MD as Consulting Physician (Cardiology)   Assessment:   This is a routine wellness examination for Matthew Daugherty.  Exercise Activities and Dietary recommendations Current Exercise Habits: Home exercise routine, Type of exercise: walking, Time (Minutes): 45, Frequency (Times/Week): 7, Weekly Exercise (Minutes/Week): 315, Intensity: Mild, Exercise limited by: None identified  Goals    . Exercise 3x per week (30 min per time)     Walk for 30 minutes at least 3 times weekly    . Increase physical activity    . Prevent falls       Fall Risk Fall Risk  05/25/2017 04/30/2017 12/10/2016 07/24/2016 03/09/2016   Falls in the past year? No No No No No  Risk for fall due to : - - - - -   Is the patient's home free of loose throw rugs in walkways, pet beds, electrical cords, etc?  Fall hazards and risks were discussed today   Depression Screen PHQ 2/9 Scores 05/25/2017 04/30/2017 12/10/2016 07/24/2016  PHQ - 2 Score 0 0 0 0    Cognitive Function MMSE - Mini Mental State Exam 05/25/2017 12/27/2015  Orientation to time 5 5  Orientation to Place 5 5  Registration 3 3  Attention/ Calculation 5 5  Recall 3 3  Language- name 2 objects 2 2  Language- repeat 1 1  Language- follow 3 step command 3 3  Language- read & follow direction 1 1  Write a sentence 1 1  Copy design 1 1  Total score 30 30        Immunization History  Administered Date(s) Administered  . Influenza Whole 11/18/2008  . Influenza, High Dose Seasonal PF 02/22/2015, 03/09/2016, 04/30/2017  . Influenza,inj,Quad PF,6+ Mos 04/11/2013, 04/19/2014  . Pneumococcal Conjugate-13 04/11/2013  . Pneumococcal Polysaccharide-23 04/19/2014  . Tdap 06/18/2008  . Zoster 04/30/2010    Qualifies for Shingles Vaccine? Discussed - this was deferred today due to cost.  Screening Tests Health Maintenance  Topic Date Due  . Hepatitis C Screening  07/25/2022 (Originally 1946-02-23)  . HEMOGLOBIN A1C  09/30/2017  . COLON CANCER SCREENING ANNUAL FOBT  12/11/2017  . OPHTHALMOLOGY EXAM  02/02/2018  . FOOT EXAM  04/30/2018  . TETANUS/TDAP  06/19/2018  . COLONOSCOPY  11/23/2018  . INFLUENZA VACCINE  Completed  . PNA vac Low Risk Adult  Completed   Cancer Screenings: Lung: Low Dose CT Chest recommended if Age 60-80 years, 30 pack-year currently smoking OR have quit w/in 15years. Patient does not qualify. He has CT scans yearly  Colorectal: done 8/18  Additional Screenings: Hepatitis B/HIV/Syphillis: Hepatitis C Screening:    denied Plan:    Pt is to keep follow up with Dr Laurance Flatten and other specialist He will review the handout on advanced  directives We did check on cost of shingrix today and pt request to wait.  I have personally reviewed and noted the following in the patient's chart:   . Medical and social history . Use of alcohol, tobacco or illicit drugs  . Current medications and supplements . Functional ability and status . Nutritional status . Physical activity . Advanced directives . List of other physicians . Hospitalizations, surgeries, and ER visits in previous 12 months . Vitals . Screenings to include cognitive, depression, and falls . Referrals and appointments  In addition, I have reviewed and discussed with patient certain preventive protocols, quality metrics, and best practice recommendations. A written personalized care plan for preventive services as well as general preventive health recommendations were provided to patient.     Jillienne Egner, Cameron Proud, LPN  06/24/6281  I have reviewed and agree with the above AWV documentation.   Mary-Margaret Hassell Done, FNP

## 2017-05-25 NOTE — Patient Instructions (Signed)
  Matthew Daugherty , Thank you for taking time to come for your Medicare Wellness Visit. I appreciate your ongoing commitment to your health goals. Please review the following plan we discussed and let me know if I can assist you in the future.   These are the goals we discussed: Goals    . Exercise 3x per week (30 min per time)     Walk for 30 minutes at least 3 times weekly    . Increase physical activity    . Prevent falls       This is a list of the screening recommended for you and due dates:  Health Maintenance  Topic Date Due  .  Hepatitis C: One time screening is recommended by Center for Disease Control  (CDC) for  adults born from [birth year] through 1963.   07/25/2022*  . Hemoglobin A1C  09/30/2017  . Stool Blood Test  12/11/2017  . Eye exam for diabetics  02/02/2018  . Complete foot exam   04/30/2018  . Tetanus Vaccine  06/19/2018  . Colon Cancer Screening  11/23/2018  . Flu Shot  Completed  . Pneumonia vaccines  Completed  *Topic was postponed. The date shown is not the original due date.   Keep follow up with Dr Laurance Flatten and other specialists Review the advanced directives We will check the [facility] cost at your next OV

## 2017-06-02 ENCOUNTER — Other Ambulatory Visit: Payer: Self-pay | Admitting: Family Medicine

## 2017-07-19 ENCOUNTER — Other Ambulatory Visit: Payer: Self-pay | Admitting: Family Medicine

## 2017-07-19 NOTE — Telephone Encounter (Signed)
OV 09/02/17 

## 2017-08-26 ENCOUNTER — Other Ambulatory Visit: Payer: Medicare Other

## 2017-08-26 DIAGNOSIS — E7800 Pure hypercholesterolemia, unspecified: Secondary | ICD-10-CM | POA: Diagnosis not present

## 2017-08-26 DIAGNOSIS — D508 Other iron deficiency anemias: Secondary | ICD-10-CM

## 2017-08-26 DIAGNOSIS — E119 Type 2 diabetes mellitus without complications: Secondary | ICD-10-CM | POA: Diagnosis not present

## 2017-08-26 LAB — BAYER DCA HB A1C WAIVED: HB A1C: 6.4 % (ref <7.0)

## 2017-08-27 LAB — LIPID PANEL
Chol/HDL Ratio: 2.6 ratio (ref 0.0–5.0)
Cholesterol, Total: 103 mg/dL (ref 100–199)
HDL: 39 mg/dL — ABNORMAL LOW (ref >39)
LDL CALC: 49 mg/dL (ref 0–99)
Triglycerides: 77 mg/dL (ref 0–149)
VLDL CHOLESTEROL CAL: 15 mg/dL (ref 5–40)

## 2017-08-27 LAB — CBC WITH DIFFERENTIAL/PLATELET
BASOS: 0 %
Basophils Absolute: 0 10*3/uL (ref 0.0–0.2)
EOS (ABSOLUTE): 0.1 10*3/uL (ref 0.0–0.4)
Eos: 1 %
HEMOGLOBIN: 11.8 g/dL — AB (ref 13.0–17.7)
Hematocrit: 36.2 % — ABNORMAL LOW (ref 37.5–51.0)
Immature Grans (Abs): 0 10*3/uL (ref 0.0–0.1)
Immature Granulocytes: 0 %
Lymphocytes Absolute: 2.3 10*3/uL (ref 0.7–3.1)
Lymphs: 36 %
MCH: 29.4 pg (ref 26.6–33.0)
MCHC: 32.6 g/dL (ref 31.5–35.7)
MCV: 90 fL (ref 79–97)
MONOCYTES: 8 %
MONOS ABS: 0.5 10*3/uL (ref 0.1–0.9)
NEUTROS ABS: 3.4 10*3/uL (ref 1.4–7.0)
Neutrophils: 55 %
Platelets: 365 10*3/uL (ref 150–379)
RBC: 4.02 x10E6/uL — ABNORMAL LOW (ref 4.14–5.80)
RDW: 13.8 % (ref 12.3–15.4)
WBC: 6.3 10*3/uL (ref 3.4–10.8)

## 2017-08-27 LAB — IRON AND TIBC
Iron Saturation: 19 % (ref 15–55)
Iron: 65 ug/dL (ref 38–169)
Total Iron Binding Capacity: 351 ug/dL (ref 250–450)
UIBC: 286 ug/dL (ref 111–343)

## 2017-08-27 LAB — BMP8+EGFR
BUN / CREAT RATIO: 11 (ref 10–24)
BUN: 13 mg/dL (ref 8–27)
CO2: 22 mmol/L (ref 20–29)
CREATININE: 1.21 mg/dL (ref 0.76–1.27)
Calcium: 9.9 mg/dL (ref 8.6–10.2)
Chloride: 97 mmol/L (ref 96–106)
GFR, EST AFRICAN AMERICAN: 70 mL/min/{1.73_m2} (ref >59)
GFR, EST NON AFRICAN AMERICAN: 61 mL/min/{1.73_m2} (ref >59)
GLUCOSE: 134 mg/dL — AB (ref 65–99)
Potassium: 4.5 mmol/L (ref 3.5–5.2)
SODIUM: 139 mmol/L (ref 134–144)

## 2017-08-27 LAB — HEPATIC FUNCTION PANEL
ALBUMIN: 4.5 g/dL (ref 3.6–4.8)
ALK PHOS: 56 IU/L (ref 39–117)
ALT: 26 IU/L (ref 0–44)
AST: 33 IU/L (ref 0–40)
Bilirubin Total: 0.5 mg/dL (ref 0.0–1.2)
Bilirubin, Direct: 0.2 mg/dL (ref 0.00–0.40)
Total Protein: 6.6 g/dL (ref 6.0–8.5)

## 2017-09-02 ENCOUNTER — Encounter: Payer: Self-pay | Admitting: Family Medicine

## 2017-09-02 ENCOUNTER — Ambulatory Visit (INDEPENDENT_AMBULATORY_CARE_PROVIDER_SITE_OTHER): Payer: Medicare Other | Admitting: Family Medicine

## 2017-09-02 VITALS — BP 131/67 | HR 76 | Temp 96.9°F | Ht 70.0 in | Wt 171.0 lb

## 2017-09-02 DIAGNOSIS — I700 Atherosclerosis of aorta: Secondary | ICD-10-CM | POA: Diagnosis not present

## 2017-09-02 DIAGNOSIS — E7800 Pure hypercholesterolemia, unspecified: Secondary | ICD-10-CM | POA: Diagnosis not present

## 2017-09-02 DIAGNOSIS — D696 Thrombocytopenia, unspecified: Secondary | ICD-10-CM | POA: Diagnosis not present

## 2017-09-02 DIAGNOSIS — D508 Other iron deficiency anemias: Secondary | ICD-10-CM | POA: Diagnosis not present

## 2017-09-02 DIAGNOSIS — I10 Essential (primary) hypertension: Secondary | ICD-10-CM | POA: Diagnosis not present

## 2017-09-02 DIAGNOSIS — N400 Enlarged prostate without lower urinary tract symptoms: Secondary | ICD-10-CM

## 2017-09-02 DIAGNOSIS — R9389 Abnormal findings on diagnostic imaging of other specified body structures: Secondary | ICD-10-CM

## 2017-09-02 DIAGNOSIS — R710 Precipitous drop in hematocrit: Secondary | ICD-10-CM | POA: Diagnosis not present

## 2017-09-02 DIAGNOSIS — E119 Type 2 diabetes mellitus without complications: Secondary | ICD-10-CM

## 2017-09-02 DIAGNOSIS — E559 Vitamin D deficiency, unspecified: Secondary | ICD-10-CM | POA: Diagnosis not present

## 2017-09-02 IMAGING — RF DG CHOLANGIOGRAM OPERATIVE
1 series · 4 of 4 positions shown · non-contrast
Comparison: MR 06/20/2015

CLINICAL DATA: Cholecystitis

EXAM:
INTRAOPERATIVE CHOLANGIOGRAM
TECHNIQUE: Cholangiographic images from the C-arm fluoroscopic device were
submitted for interpretation post-operatively. Please see the
procedural report for the amount of contrast and the fluoroscopy
time utilized.

[Series 1: run · 4 of 73 frames shown]
[frame 7/73]
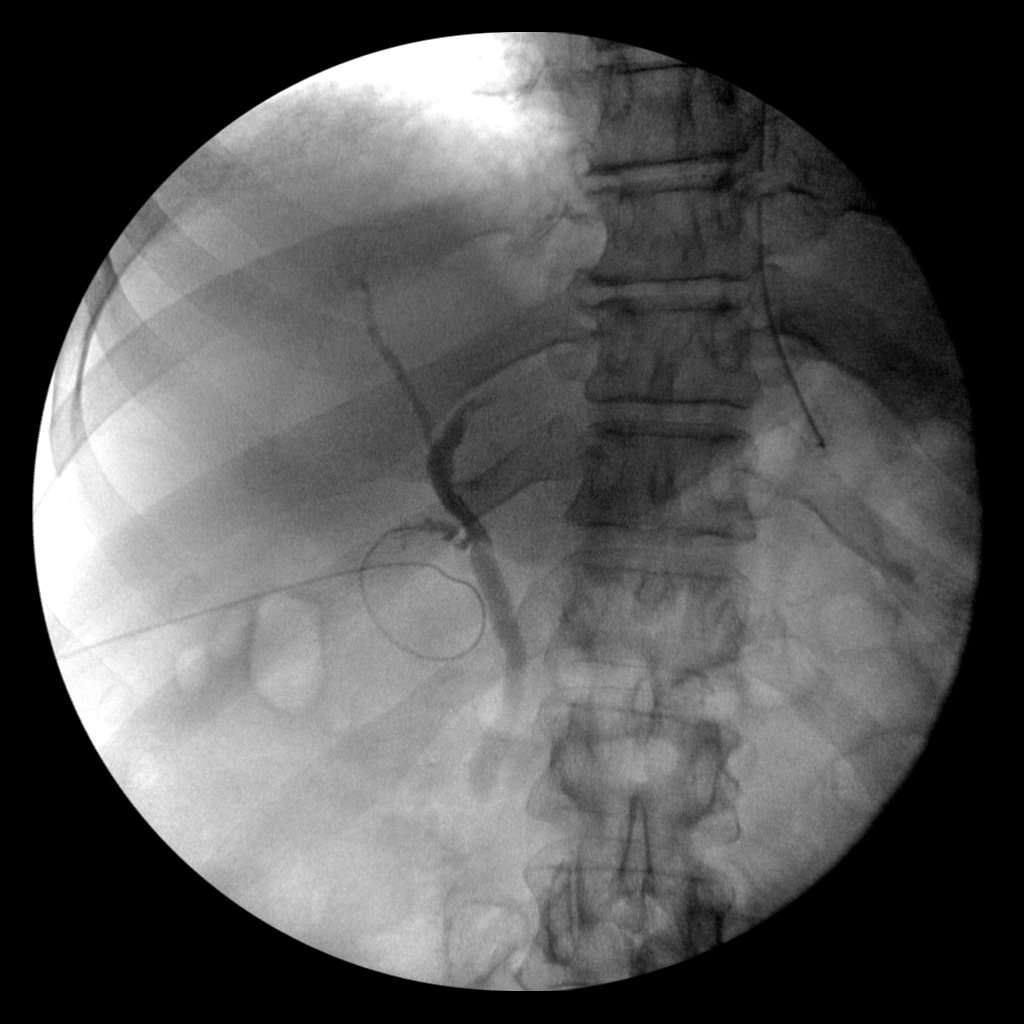
[frame 11/73]
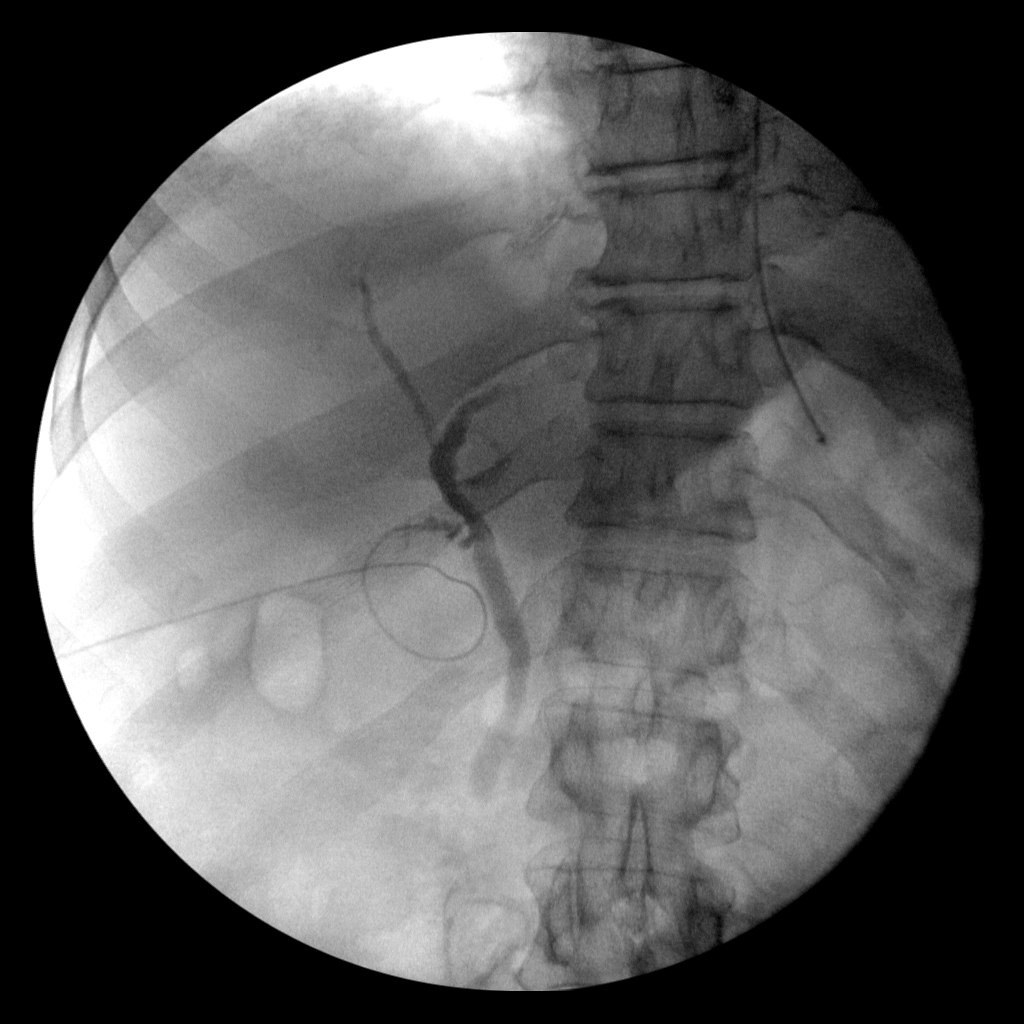
[frame 37/73]
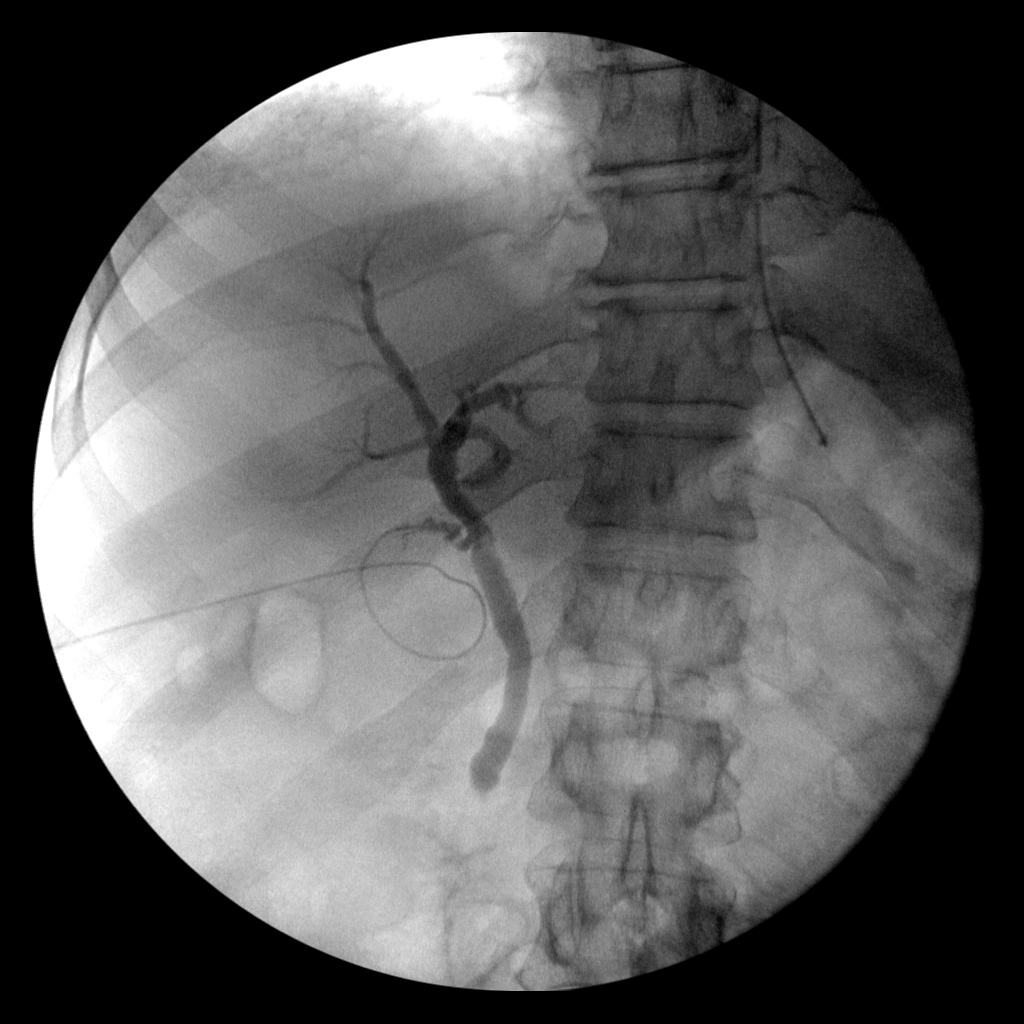
[frame 63/73]
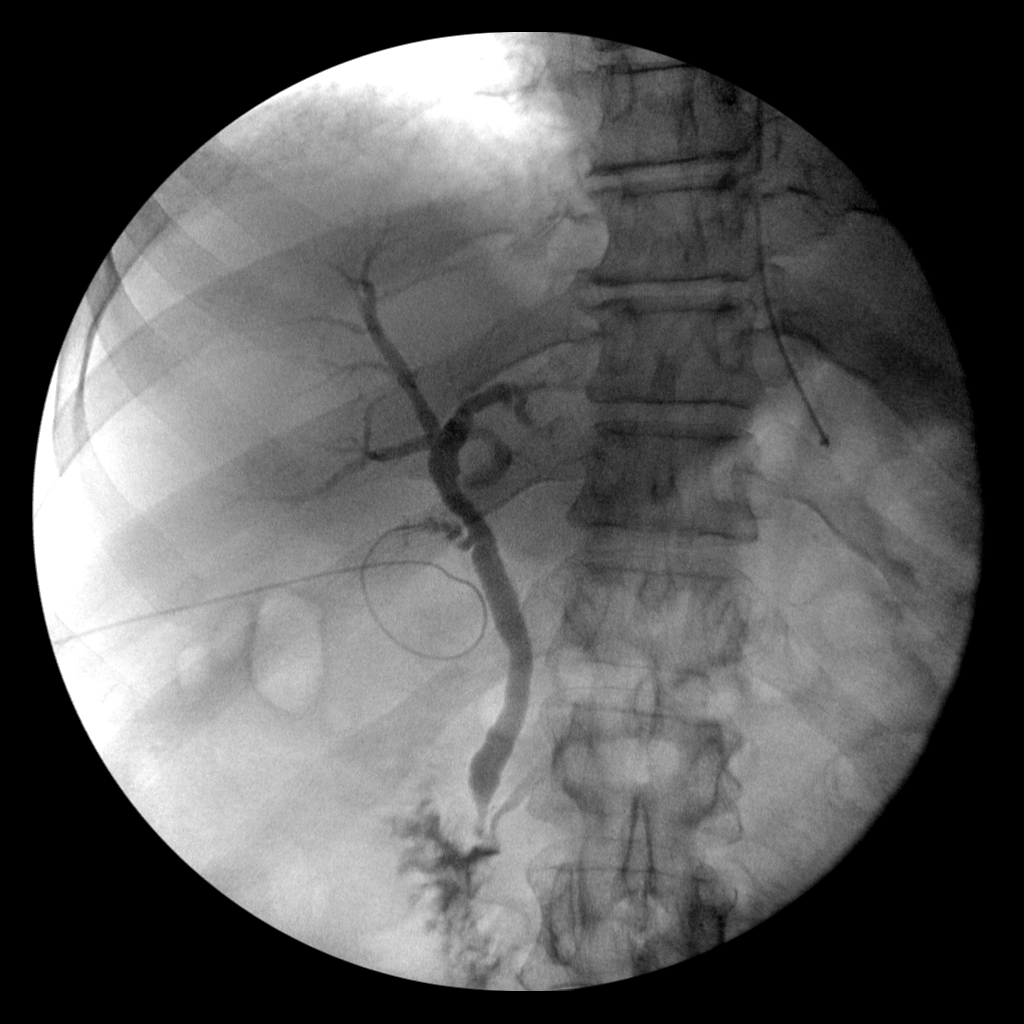

[4 of 4 positions shown; findings below may reference images not displayed]

FINDINGS: No persistent filling defects in the common duct. Intrahepatic ducts
are incompletely visualized, appearing decompressed centrally.
Contrast passes into the duodenum.

:
Negative for retained common duct stone.

## 2017-09-02 NOTE — Progress Notes (Signed)
Subjective:    Patient ID: Matthew Daugherty, male    DOB: 22-Jun-1945, 72 y.o.   MRN: [id number]  HPI Pt here for follow up and management of chronic medical problems which includes hypertension and diabetes. He is taking medication regularly.  The patient is doing well overall with no specific complaints.  He did return his FOBT.  He has had lab work done and this will be reviewed with him during the visit today.  The hemoglobin A1c was excellent at 6.4%.  The blood sugar was elevated at 134.  The creatinine, and all of the electrolytes including potassium were normal.  The CBC had a normal white blood cell count.  The hemoglobin was slightly decreased at 11.8 and he should stay on his current iron preparation.  Platelet count was adequate.  All liver function tests were normal.  Cholesterol numbers with traditional lipid testing were excellent including a low LDL C at 49 and triglycerides being good at 77.  The good cholesterol the HDL remains low as it has been in the past.  The serum iron levels continue to improve but the patient should continue with his current iron replacement.  The patient did have a CT of the chest without contrast in January of this year.  The right lower lobe nodule has been stable over one year and was could considered to be benign.  Additional pulmonary nodules are stable and consistent with benign etiology.  The patient will be given a copy of this report for his records.  Patient has last colonoscopy in August 2015.  At that time, he did have one polyp and this was done by Dr. Watt Climes.  The patient is pleasant and alert and just brought his FOBT back.  He denies any chest pain pressure tightness or shortness of breath.  He denies any trouble with swallowing heartburn indigestion nausea vomiting diarrhea blood in the stool or change in bowel habits.  He continues to take his iron regularly.  His next colonoscopy was going to be 5 years after the last one.  This would be next August  2020.  He is passing his water without problems.  He is up-to-date on his eye exam.    Patient Active Problem List   Diagnosis Date Noted  . Anemia, iron deficiency 04/02/2017  . Thrombocytosis ([place]) 01/26/2017  . History of colon polyps 12/10/2016  . Pulmonary nodule 07/24/2016  . Gallstone pancreatitis s/p lap cholecystectomy 06/22/2015 06/19/2015  . Diabetes mellitus type 2, controlled ([place]) 06/19/2015  . Essential hypertension 06/19/2015  . Pancreatitis 06/19/2015  . Hypothyroid 12/05/2012  . Vitamin D deficiency 12/05/2012  . Type 2 diabetes mellitus ([place]) 12/05/2012  . Hyperlipemia 08/02/2012  . Hypertension   . Cardiac dysrhythmia, unspecified, history of   . BPH (benign prostatic hyperplasia)    Outpatient Encounter Medications as of 09/02/2017  Medication Sig  . aspirin 81 MG EC tablet Take 81 mg by mouth daily.    Marland Kitchen atorvastatin (LIPITOR) 80 MG tablet TAKE 1 TABLET EVERY DAY  AT  6PM  . Cholecalciferol (VITAMIN D-3) 5000 UNITS TABS Take 1 tablet by mouth daily.    Marland Kitchen ezetimibe (ZETIA) 10 MG tablet Take 1 tablet (10 mg total) by mouth daily.  Marland Kitchen FeFum-FePoly-FA-B Cmp-C-Biot (INTEGRA PLUS) CAPS Take 1 capsule by mouth daily.  . fenofibrate 160 MG tablet TAKE 1 TABLET EVERY DAY  . levothyroxine (SYNTHROID, LEVOTHROID) 75 MCG tablet TAKE 1 TABLET EVERY DAY BEFORE BREAKFAST  . losartan-hydrochlorothiazide (HYZAAR) 100-25  MG tablet TAKE 1 TABLET EVERY DAY  . metFORMIN (GLUCOPHAGE) 1000 MG tablet TAKE 1 TABLET TWICE DAILY WITH MEALS  . Multiple Vitamin (MULTIVITAMIN WITH MINERALS) TABS tablet Take 1 tablet by mouth daily.   No facility-administered encounter medications on file as of 09/02/2017.       Review of Systems  Constitutional: Negative.   HENT: Negative.   Eyes: Negative.   Respiratory: Negative.   Cardiovascular: Negative.   Gastrointestinal: Negative.   Endocrine: Negative.   Genitourinary: Negative.   Musculoskeletal: Negative.   Skin: Negative.     Allergic/Immunologic: Negative.   Neurological: Negative.   Hematological: Negative.   Psychiatric/Behavioral: Negative.        Objective:   Physical Exam  Constitutional: He is oriented to person, place, and time. He appears well-developed and well-nourished. No distress.  The patient is calm pleasant and relaxed and active and feeling well  HENT:  Head: Normocephalic and atraumatic.  Right Ear: External ear normal.  Nose: Nose normal.  Mouth/Throat: Oropharynx is clear and moist. No oropharyngeal exudate.  Small amount of cerumen left ear canal  Eyes: Pupils are equal, round, and reactive to light. Conjunctivae and EOM are normal. Right eye exhibits no discharge. Left eye exhibits no discharge. No scleral icterus.  Up-to-date on eye exams with no eye problems but does wear glasses  Neck: Normal range of motion. Neck supple. No thyromegaly present.  No bruits thyromegaly or anterior cervical adenopathy  Cardiovascular: Normal rate, regular rhythm, normal heart sounds and intact distal pulses.  No murmur heard. Heart is regular at 84/min  Pulmonary/Chest: Effort normal and breath sounds normal. He has no wheezes. He has no rales. He exhibits no tenderness.  Clear anteriorly and posteriorly and no axillary adenopathy  Abdominal: Soft. Bowel sounds are normal. He exhibits no mass. There is no tenderness.  No liver or spleen enlargement no masses normal bowel sounds and no inguinal adenopathy no tenderness  Musculoskeletal: Normal range of motion. He exhibits no edema.  Lymphadenopathy:    He has no cervical adenopathy.  Neurological: He is alert and oriented to person, place, and time. He has normal reflexes. No cranial nerve deficit.  Skin: Skin is warm and dry. No rash noted.  Psychiatric: He has a normal mood and affect. His behavior is normal. Judgment and thought content normal.  Normal mood affect and behavior  Nursing note and vitals reviewed.  BP 131/67 (BP Location: Left  Arm)   Pulse 76   Temp (!) 96.9 F (36.1 C) (Oral)   Ht 5\' 10"  (1.778 m)   Wt 171 lb (77.6 kg)   BMI 24.54 kg/m         Assessment & Plan:  1. Type 2 diabetes mellitus without complication, without long-term current use of insulin (HCC) -Hemoglobin A1c was excellent and the patient will continue his current treatment regimen  2. Pure hypercholesterolemia -All cholesterol numbers were excellent other than a low HDL and he would continue with his current treatment regimen and continue with his aggressive therapeutic lifestyle changes as possible  3. Hypertension -The blood pressure is good he will continue with current treatment  4. Vitamin D deficiency -Tinea with current treatment  5. Benign prostatic hyperplasia, unspecified whether lower urinary tract symptoms present -No complaints today with voiding.  6. Thrombocytosis -Platelet count is normal this time.  7. Aortic atherosclerosis (HCC) -Cholesterol numbers were excellent and he will continue with his current physical activity therapeutic lifestyle changes and statin treatment  8.  Decreased hemoglobin -The patient continues to have iron deficiency anemia and will continue with his iron replacement.  The FOBT is still pending - Fecal occult blood, imunochemical  9. Iron deficiency anemia secondary to inadequate dietary iron intake -Continue with iron replacement, next colonoscopy is not due until next August 2020.  10. Abnormal chest CT -The most recent CT of the chest was reviewed with the patient today and the pulmonary nodules that have been present are considered to be benign because there is not been any change in them.  Patient Instructions                       Medicare Annual Wellness Visit  [facility] and the medical providers at [facility] strive to bring you the best medical care.  In doing so we not only want to address your current medical conditions and concerns but also to  detect new conditions early and prevent illness, disease and health-related problems.    Medicare offers a yearly Wellness Visit which allows our clinical staff to assess your need for preventative services including immunizations, lifestyle education, counseling to decrease risk of preventable diseases and screening for fall risk and other medical concerns.    This visit is provided free of charge (no copay) for all Medicare recipients. The clinical pharmacists at [facility] have begun to conduct these Wellness Visits which will also include a thorough review of all your medications.    As you primary medical provider recommend that you make an appointment for your Annual Wellness Visit if you have not done so already this year.  You may set up this appointment before you leave today or you may call back ([phone]) and schedule an appointment.  Please make sure when you call that you mention that you are scheduling your Annual Wellness Visit with the clinical pharmacist so that the appointment may be made for the proper length of time.     Continue current medications. Continue good therapeutic lifestyle changes which include good diet and exercise. Fall precautions discussed with patient. If an FOBT was given today- please return it to our front desk. If you are over 67 years old - you may need Prevnar 66 or the adult Pneumonia vaccine.  **Flu shots are available--- please call and schedule a FLU-CLINIC appointment**  After your visit with [place] today you will receive a survey in the mail or online from [employer] regarding your care with [place]. Please take a moment to fill this out. Your feedback is very important to [place] as you can help [place] better understand your patient needs as well as improve your experience and satisfaction. WE CARE ABOUT YOU!!!   \Stay active physically and continue with current medications we will call with results of the FOBT as soon as that becomes  available This summer drink plenty of fluids and stay well-hydrated   Arrie Senate MD

## 2017-09-02 NOTE — Patient Instructions (Addendum)
Medicare Annual Wellness Visit  Matthew Daugherty and the medical providers at [facility] strive to bring you the best medical care.  In doing so we not only want to address your current medical conditions and concerns but also to detect new conditions early and prevent illness, disease and health-related problems.    Medicare offers a yearly Wellness Visit which allows our clinical staff to assess your need for preventative services including immunizations, lifestyle education, counseling to decrease risk of preventable diseases and screening for fall risk and other medical concerns.    This visit is provided free of charge (no copay) for all Medicare recipients. The clinical pharmacists at [facility] have begun to conduct these Wellness Visits which will also include a thorough review of all your medications.    As you primary medical provider recommend that you make an appointment for your Annual Wellness Visit if you have not done so already this year.  You may set up this appointment before you leave today or you may call back ([phone]) and schedule an appointment.  Please make sure when you call that you mention that you are scheduling your Annual Wellness Visit with the clinical pharmacist so that the appointment may be made for the proper length of time.     Continue current medications. Continue good therapeutic lifestyle changes which include good diet and exercise. Fall precautions discussed with patient. If an FOBT was given today- please return it to our front desk. If you are over 83 years old - you may need Prevnar 27 or the adult Pneumonia vaccine.  **Flu shots are available--- please call and schedule a FLU-CLINIC appointment**  After your visit with [place] today you will receive a survey in the mail or online from [employer] regarding your care with [place]. Please take a moment to fill this out. Your feedback is very  important to [place] as you can help [place] better understand your patient needs as well as improve your experience and satisfaction. WE CARE ABOUT YOU!!!   Matthew Daugherty active physically and continue with current medications we will call with results of the FOBT as soon as that becomes available This summer drink plenty of fluids and stay well-hydrated

## 2017-09-04 LAB — FECAL OCCULT BLOOD, IMMUNOCHEMICAL: Fecal Occult Bld: NEGATIVE

## 2017-09-20 ENCOUNTER — Other Ambulatory Visit: Payer: Self-pay | Admitting: Family Medicine

## 2017-10-04 ENCOUNTER — Other Ambulatory Visit: Payer: Self-pay | Admitting: Family Medicine

## 2017-10-25 ENCOUNTER — Other Ambulatory Visit: Payer: Self-pay | Admitting: Family Medicine

## 2017-12-28 ENCOUNTER — Other Ambulatory Visit: Payer: Self-pay | Admitting: Family Medicine

## 2017-12-30 ENCOUNTER — Other Ambulatory Visit: Payer: Medicare Other

## 2017-12-30 DIAGNOSIS — E039 Hypothyroidism, unspecified: Secondary | ICD-10-CM

## 2017-12-30 DIAGNOSIS — E118 Type 2 diabetes mellitus with unspecified complications: Secondary | ICD-10-CM | POA: Diagnosis not present

## 2017-12-30 DIAGNOSIS — E7800 Pure hypercholesterolemia, unspecified: Secondary | ICD-10-CM | POA: Diagnosis not present

## 2017-12-30 DIAGNOSIS — I10 Essential (primary) hypertension: Secondary | ICD-10-CM | POA: Diagnosis not present

## 2017-12-31 LAB — CBC WITH DIFFERENTIAL/PLATELET
BASOS: 1 %
Basophils Absolute: 0 10*3/uL (ref 0.0–0.2)
EOS (ABSOLUTE): 0.1 10*3/uL (ref 0.0–0.4)
EOS: 1 %
HEMATOCRIT: 38.9 % (ref 37.5–51.0)
Hemoglobin: 12.8 g/dL — ABNORMAL LOW (ref 13.0–17.7)
IMMATURE GRANS (ABS): 0 10*3/uL (ref 0.0–0.1)
Immature Granulocytes: 0 %
LYMPHS: 33 %
Lymphocytes Absolute: 2.6 10*3/uL (ref 0.7–3.1)
MCH: 29.7 pg (ref 26.6–33.0)
MCHC: 32.9 g/dL (ref 31.5–35.7)
MCV: 90 fL (ref 79–97)
Monocytes Absolute: 0.7 10*3/uL (ref 0.1–0.9)
Monocytes: 8 %
NEUTROS PCT: 57 %
Neutrophils Absolute: 4.5 10*3/uL (ref 1.4–7.0)
PLATELETS: 381 10*3/uL (ref 150–450)
RBC: 4.31 x10E6/uL (ref 4.14–5.80)
RDW: 12.6 % (ref 12.3–15.4)
WBC: 7.9 10*3/uL (ref 3.4–10.8)

## 2017-12-31 LAB — HEPATIC FUNCTION PANEL
ALBUMIN: 4.8 g/dL (ref 3.6–4.8)
ALT: 22 IU/L (ref 0–44)
AST: 27 IU/L (ref 0–40)
Alkaline Phosphatase: 52 IU/L (ref 39–117)
BILIRUBIN TOTAL: 0.3 mg/dL (ref 0.0–1.2)
BILIRUBIN, DIRECT: 0.19 mg/dL (ref 0.00–0.40)
Total Protein: 7.1 g/dL (ref 6.0–8.5)

## 2017-12-31 LAB — BMP8+EGFR
BUN / CREAT RATIO: 15 (ref 10–24)
BUN: 19 mg/dL (ref 8–27)
CHLORIDE: 96 mmol/L (ref 96–106)
CO2: 22 mmol/L (ref 20–29)
Calcium: 10 mg/dL (ref 8.6–10.2)
Creatinine, Ser: 1.24 mg/dL (ref 0.76–1.27)
GFR calc Af Amer: 68 mL/min/{1.73_m2} (ref >59)
GFR calc non Af Amer: 59 mL/min/{1.73_m2} — ABNORMAL LOW (ref >59)
Glucose: 144 mg/dL — ABNORMAL HIGH (ref 65–99)
POTASSIUM: 4.8 mmol/L (ref 3.5–5.2)
SODIUM: 137 mmol/L (ref 134–144)

## 2017-12-31 LAB — LIPID PANEL
CHOLESTEROL TOTAL: 112 mg/dL (ref 100–199)
Chol/HDL Ratio: 2.9 ratio (ref 0.0–5.0)
HDL: 38 mg/dL — ABNORMAL LOW (ref >39)
LDL Calculated: 56 mg/dL (ref 0–99)
Triglycerides: 90 mg/dL (ref 0–149)
VLDL Cholesterol Cal: 18 mg/dL (ref 5–40)

## 2017-12-31 LAB — TSH: TSH: 7.08 u[IU]/mL — ABNORMAL HIGH (ref 0.450–4.500)

## 2018-01-04 ENCOUNTER — Other Ambulatory Visit: Payer: Self-pay

## 2018-01-04 DIAGNOSIS — E119 Type 2 diabetes mellitus without complications: Secondary | ICD-10-CM

## 2018-01-06 ENCOUNTER — Ambulatory Visit (INDEPENDENT_AMBULATORY_CARE_PROVIDER_SITE_OTHER): Payer: Medicare Other | Admitting: Family Medicine

## 2018-01-06 ENCOUNTER — Encounter: Payer: Self-pay | Admitting: Family Medicine

## 2018-01-06 VITALS — BP 136/82 | HR 90 | Temp 97.2°F | Ht 70.0 in | Wt 173.0 lb

## 2018-01-06 DIAGNOSIS — N400 Enlarged prostate without lower urinary tract symptoms: Secondary | ICD-10-CM | POA: Diagnosis not present

## 2018-01-06 DIAGNOSIS — E119 Type 2 diabetes mellitus without complications: Secondary | ICD-10-CM | POA: Diagnosis not present

## 2018-01-06 DIAGNOSIS — D508 Other iron deficiency anemias: Secondary | ICD-10-CM

## 2018-01-06 DIAGNOSIS — I700 Atherosclerosis of aorta: Secondary | ICD-10-CM | POA: Diagnosis not present

## 2018-01-06 DIAGNOSIS — R9389 Abnormal findings on diagnostic imaging of other specified body structures: Secondary | ICD-10-CM

## 2018-01-06 DIAGNOSIS — R710 Precipitous drop in hematocrit: Secondary | ICD-10-CM

## 2018-01-06 DIAGNOSIS — E7800 Pure hypercholesterolemia, unspecified: Secondary | ICD-10-CM | POA: Diagnosis not present

## 2018-01-06 DIAGNOSIS — E559 Vitamin D deficiency, unspecified: Secondary | ICD-10-CM

## 2018-01-06 DIAGNOSIS — E039 Hypothyroidism, unspecified: Secondary | ICD-10-CM | POA: Diagnosis not present

## 2018-01-06 DIAGNOSIS — I10 Essential (primary) hypertension: Secondary | ICD-10-CM

## 2018-01-06 LAB — URINALYSIS, COMPLETE
Bilirubin, UA: NEGATIVE
Glucose, UA: NEGATIVE
Ketones, UA: NEGATIVE
LEUKOCYTES UA: NEGATIVE
Nitrite, UA: NEGATIVE
PH UA: 8 — AB (ref 5.0–7.5)
PROTEIN UA: NEGATIVE
RBC, UA: NEGATIVE
Specific Gravity, UA: 1.015 (ref 1.005–1.030)
Urobilinogen, Ur: 0.2 mg/dL (ref 0.2–1.0)

## 2018-01-06 LAB — MICROSCOPIC EXAMINATION
BACTERIA UA: NONE SEEN
EPITHELIAL CELLS (NON RENAL): NONE SEEN /HPF (ref 0–10)
RBC, UA: NONE SEEN /hpf (ref 0–2)
RENAL EPITHEL UA: NONE SEEN /HPF
WBC, UA: NONE SEEN /hpf (ref 0–5)

## 2018-01-06 LAB — HGB A1C W/O EAG: Hgb A1c MFr Bld: 7.1 % — ABNORMAL HIGH (ref 4.8–5.6)

## 2018-01-06 LAB — SPECIMEN STATUS REPORT

## 2018-01-06 MED ORDER — LEVOTHYROXINE SODIUM 100 MCG PO TABS: 100.0000 ug | ORAL_TABLET | Freq: Every day | ORAL | 3 refills | Status: DC

## 2018-01-06 NOTE — Progress Notes (Signed)
Subjective:    Patient ID: Matthew Daugherty, male    DOB: 01-Mar-1946, 72 y.o.   MRN: [id number]  HPI  Pt here for follow up and management of chronic medical problems which includes diabetes, hypertension and hyperlipidemia. He is taking medication regularly.  Patient is doing well today with no specific complaints and he does not need any refills.  He is due to get his rectal exam.  He has had his lab work done this will be reviewed with him during the visit today.  He will also get a urinalysis and EKG.  He did have a chest CT in January of this year.  The right lower lobe pulmonary nodule over one year interval was consistent with benign etiology and additional pulmonary nodules are stable and consistent with benign etiology.  It did show aortic atherosclerosis.  The patient's hemoglobin A1c was 7.1%.  The TSH was slightly elevated indicating he is not getting enough thyroid medication.  We are going to recommend he increase his levothyroxine to 100 mcg daily.  He will need to have a repeat thyroid profile 6 to 8 weeks after doing this.  The blood sugar was elevated at 144.  The creatinine, the most important kidney function test was within normal limits all the electrolytes including sodium potassium are good.  His cholesterol numbers were good with an LDL C being 56 the good cholesterol being low and triglycerides being good at 90.  The CBC had a normal white blood cell count is slightly decreased but improved hemoglobin at 12.8 and an adequate platelet count.  All liver function tests were within normal limits.  The patient is doing well with no complaints.  He denies any chest pain pressure tightness or shortness of breath.  He denies any trouble with swallowing heartburn indigestion nausea vomiting diarrhea blood in the stool or black tarry bowel movements.  He did have an FOBT done recently that was negative.  He is being scheduled very soon for repeat colonoscopy.  He is passing his water without  problems.    Patient Active Problem List   Diagnosis Date Noted  . Anemia, iron deficiency 04/02/2017  . Thrombocytosis ([place]) 01/26/2017  . History of colon polyps 12/10/2016  . Pulmonary nodule 07/24/2016  . Gallstone pancreatitis s/p lap cholecystectomy 06/22/2015 06/19/2015  . Diabetes mellitus type 2, controlled ([place]) 06/19/2015  . Essential hypertension 06/19/2015  . Pancreatitis 06/19/2015  . Hypothyroid 12/05/2012  . Vitamin D deficiency 12/05/2012  . Type 2 diabetes mellitus ([place]) 12/05/2012  . Hyperlipemia 08/02/2012  . Hypertension   . Cardiac dysrhythmia, unspecified, history of   . BPH (benign prostatic hyperplasia)    Outpatient Encounter Medications as of 01/06/2018  Medication Sig  . aspirin 81 MG EC tablet Take 81 mg by mouth daily.    Marland Kitchen atorvastatin (LIPITOR) 80 MG tablet TAKE 1 TABLET EVERY DAY  AT  6PM  . Cholecalciferol (VITAMIN D-3) 5000 UNITS TABS Take 1 tablet by mouth daily.    Marland Kitchen FeFum-FePoly-FA-B Cmp-C-Biot (INTEGRA PLUS) CAPS Take 1 capsule by mouth daily.  . fenofibrate 160 MG tablet TAKE 1 TABLET EVERY DAY  . levothyroxine (SYNTHROID, LEVOTHROID) 75 MCG tablet TAKE 1 TABLET EVERY DAY BEFORE BREAKFAST  . losartan-hydrochlorothiazide (HYZAAR) 100-25 MG tablet TAKE 1 TABLET EVERY DAY  . metFORMIN (GLUCOPHAGE) 1000 MG tablet TAKE 1 TABLET TWICE DAILY WITH MEALS  . Multiple Vitamin (MULTIVITAMIN WITH MINERALS) TABS tablet Take 1 tablet by mouth daily.  . [DISCONTINUED] ezetimibe (ZETIA) 10  MG tablet Take 1 tablet (10 mg total) by mouth daily.   No facility-administered encounter medications on file as of 01/06/2018.       Review of Systems  Constitutional: Negative.   HENT: Negative.   Eyes: Negative.   Respiratory: Negative.   Cardiovascular: Negative.   Gastrointestinal: Negative.   Endocrine: Negative.   Genitourinary: Negative.   Musculoskeletal: Negative.   Skin: Negative.   Allergic/Immunologic: Negative.   Neurological: Negative.     Hematological: Negative.   Psychiatric/Behavioral: Negative.        Objective:   Physical Exam  Constitutional: He is oriented to person, place, and time. He appears well-developed and well-nourished. No distress.  The patient is pleasant and relaxed.  HENT:  Head: Normocephalic and atraumatic.  Right Ear: External ear normal.  Left Ear: External ear normal.  Nose: Nose normal.  Mouth/Throat: Oropharynx is clear and moist. No oropharyngeal exudate.  Eyes: Pupils are equal, round, and reactive to light. Conjunctivae and EOM are normal. Right eye exhibits no discharge. Left eye exhibits no discharge. No scleral icterus.  Neck: Normal range of motion. Neck supple. No thyromegaly present.  No bruits thyromegaly or anterior cervical adenopathy  Cardiovascular: Normal rate, regular rhythm, normal heart sounds and intact distal pulses.  No murmur heard. Heart is regular at 72/min  Pulmonary/Chest: Effort normal and breath sounds normal. He has no wheezes. He has no rales. He exhibits no tenderness.  Lungs are clear anteriorly and posteriorly and no axillary adenopathy  Abdominal: Soft. Bowel sounds are normal. He exhibits no mass. There is no tenderness. There is no rebound and no guarding.  No liver or spleen enlargement.  No epigastric tenderness.  No inguinal adenopathy.  Genitourinary: Rectum normal and penis normal.  Genitourinary Comments: The prostate gland was enlarged but soft and smooth without any lumps or masses.  There were no rectal masses.  External genitalia were within normal limits with no inguinal hernias being detected.  Musculoskeletal: Normal range of motion. He exhibits no edema.  Lymphadenopathy:    He has no cervical adenopathy.  Neurological: He is alert and oriented to person, place, and time. He has normal reflexes. No cranial nerve deficit.  Reflexes were 2+ and equal bilaterally  Skin: Skin is warm and dry. No rash noted.  Psychiatric: He has a normal mood  and affect. His behavior is normal. Judgment and thought content normal.  The patient's mood affect and behavior were all normal.  Nursing note and vitals reviewed.   BP 136/82 (BP Location: Left Arm)   Pulse 90   Temp (!) 97.2 F (36.2 C) (Oral)   Ht 5\' 10"  (1.778 m)   Wt 173 lb (78.5 kg)   BMI 24.82 kg/m        Assessment & Plan:  1. Type 2 diabetes mellitus without complication, without long-term current use of insulin (HCC) -The blood sugar was under fairly good control with an A1c at 7.1%.  2. Pure hypercholesterolemia -Cholesterol numbers were excellent and patient will continue with his current treatment - EKG 12-Lead  3. Hypertension -Blood pressure was good he will continue with current treatment - EKG 12-Lead  4. Vitamin D deficiency -Continue with vitamin D replacement  5. Benign prostatic hyperplasia, unspecified whether lower urinary tract symptoms present -Prostate was slightly enlarged but smooth without any lumps or masses and patient is having no symptoms passing his water currently. - Urinalysis, Complete  6. Aortic atherosclerosis ([place]) -Continue with aggressive therapeutic lifestyle changes and current  statin drug - EKG 12-Lead  7. Hypothyroidism, unspecified type -Will increase levothyroxine to 100 mcg daily and patient will come by in 6 to 8 weeks to have thyroid profile repeated to ensure adequate control - Thyroid Panel With TSH; Future  8. Abnormal chest CT -The most recent chest CT was reviewed with the patient and it was noted that all pulmonary nodules have a benign etiology and are stable and based on this no further CTs will be necessary.  9. Decreased hemoglobin -Continue to monitor stools for blood loss and change in color.  Colonoscopy will be done next summer.  Dr. May God is his gastroenterologist.  10. Iron deficiency anemia secondary to inadequate dietary iron intake -Continue with iron supplementation  Meds ordered this  encounter  Medications  . levothyroxine (SYNTHROID, LEVOTHROID) 100 MCG tablet    Sig: Take 1 tablet (100 mcg total) by mouth daily before breakfast.    Dispense:  90 tablet    Refill:  3   Patient Instructions                       Medicare Annual Wellness Visit  [facility] and the medical providers at [facility] strive to bring you the best medical care.  In doing so we not only want to address your current medical conditions and concerns but also to detect new conditions early and prevent illness, disease and health-related problems.    Medicare offers a yearly Wellness Visit which allows our clinical staff to assess your need for preventative services including immunizations, lifestyle education, counseling to decrease risk of preventable diseases and screening for fall risk and other medical concerns.    This visit is provided free of charge (no copay) for all Medicare recipients. The clinical pharmacists at [facility] have begun to conduct these Wellness Visits which will also include a thorough review of all your medications.    As you primary medical provider recommend that you make an appointment for your Annual Wellness Visit if you have not done so already this year.  You may set up this appointment before you leave today or you may call back ([phone]) and schedule an appointment.  Please make sure when you call that you mention that you are scheduling your Annual Wellness Visit with the clinical pharmacist so that the appointment may be made for the proper length of time.     Continue current medications. Continue good therapeutic lifestyle changes which include good diet and exercise. Fall precautions discussed with patient. If an FOBT was given today- please return it to our front desk. If you are over 51 years old - you may need Prevnar 25 or the adult Pneumonia vaccine.  **Flu shots are available--- please call and schedule a  FLU-CLINIC appointment**  After your visit with [place] today you will receive a survey in the mail or online from [employer] regarding your care with [place]. Please take a moment to fill this out. Your feedback is very important to [place] as you can help [place] better understand your patient needs as well as improve your experience and satisfaction. WE CARE ABOUT YOU!!!   Continue to stay active physically Come by in 4-6 or 4 to 8 weeks and repeat the thyroid profile after starting the new dosage of medicine Drink plenty of water and fluids Check blood sugars periodically Do not forget to get your flu shot in October  Arrie Senate MD

## 2018-01-06 NOTE — Patient Instructions (Addendum)
Medicare Annual Wellness Visit  [facility] and the medical providers at [facility] strive to bring you the best medical care.  In doing so we not only want to address your current medical conditions and concerns but also to detect new conditions early and prevent illness, disease and health-related problems.    Medicare offers a yearly Wellness Visit which allows our clinical staff to assess your need for preventative services including immunizations, lifestyle education, counseling to decrease risk of preventable diseases and screening for fall risk and other medical concerns.    This visit is provided free of charge (no copay) for all Medicare recipients. The clinical pharmacists at [facility] have begun to conduct these Wellness Visits which will also include a thorough review of all your medications.    As you primary medical provider recommend that you make an appointment for your Annual Wellness Visit if you have not done so already this year.  You may set up this appointment before you leave today or you may call back ([phone]) and schedule an appointment.  Please make sure when you call that you mention that you are scheduling your Annual Wellness Visit with the clinical pharmacist so that the appointment may be made for the proper length of time.     Continue current medications. Continue good therapeutic lifestyle changes which include good diet and exercise. Fall precautions discussed with patient. If an FOBT was given today- please return it to our front desk. If you are over 78 years old - you may need Prevnar 72 or the adult Pneumonia vaccine.  **Flu shots are available--- please call and schedule a FLU-CLINIC appointment**  After your visit with [place] today you will receive a survey in the mail or online from [employer] regarding your care with [place]. Please take a moment to fill this out. Your feedback is very  important to [place] as you can help [place] better understand your patient needs as well as improve your experience and satisfaction. WE CARE ABOUT YOU!!!   Continue to stay active physically Come by in 4-6 or 4 to 8 weeks and repeat the thyroid profile after starting the new dosage of medicine Drink plenty of water and fluids Check blood sugars periodically Do not forget to get your flu shot in October

## 2018-01-07 LAB — SPECIMEN STATUS REPORT

## 2018-01-26 ENCOUNTER — Other Ambulatory Visit: Payer: Self-pay | Admitting: Family Medicine

## 2018-01-28 DIAGNOSIS — E119 Type 2 diabetes mellitus without complications: Secondary | ICD-10-CM | POA: Diagnosis not present

## 2018-01-28 DIAGNOSIS — H40033 Anatomical narrow angle, bilateral: Secondary | ICD-10-CM | POA: Diagnosis not present

## 2018-01-28 LAB — HM DIABETES EYE EXAM

## 2018-02-17 ENCOUNTER — Ambulatory Visit (INDEPENDENT_AMBULATORY_CARE_PROVIDER_SITE_OTHER): Payer: Medicare Other

## 2018-02-17 DIAGNOSIS — Z23 Encounter for immunization: Secondary | ICD-10-CM | POA: Diagnosis not present

## 2018-02-17 DIAGNOSIS — E039 Hypothyroidism, unspecified: Secondary | ICD-10-CM

## 2018-02-17 DIAGNOSIS — E119 Type 2 diabetes mellitus without complications: Secondary | ICD-10-CM

## 2018-02-17 LAB — BAYER DCA HB A1C WAIVED: HB A1C: 6.9 % (ref <7.0)

## 2018-02-18 LAB — THYROID PANEL WITH TSH
Free Thyroxine Index: 3.4 (ref 1.2–4.9)
T3 Uptake Ratio: 30 % (ref 24–39)
T4, Total: 11.3 ug/dL (ref 4.5–12.0)
TSH: 2.75 u[IU]/mL (ref 0.450–4.500)

## 2018-02-22 ENCOUNTER — Other Ambulatory Visit: Payer: Self-pay | Admitting: Family Medicine

## 2018-03-02 ENCOUNTER — Other Ambulatory Visit: Payer: Self-pay | Admitting: Family Medicine

## 2018-04-29 ENCOUNTER — Other Ambulatory Visit: Payer: Self-pay | Admitting: Family Medicine

## 2018-05-03 ENCOUNTER — Other Ambulatory Visit: Payer: Self-pay | Admitting: *Deleted

## 2018-05-03 MED ORDER — METFORMIN HCL 1000 MG PO TABS: 1000.0000 mg | ORAL_TABLET | Freq: Two times a day (BID) | ORAL | 3 refills | Status: DC

## 2018-05-03 MED ORDER — METFORMIN HCL 1000 MG PO TABS: 1000.0000 mg | ORAL_TABLET | Freq: Two times a day (BID) | ORAL | 0 refills | Status: DC

## 2018-05-09 ENCOUNTER — Other Ambulatory Visit: Payer: Self-pay | Admitting: Family Medicine

## 2018-05-10 ENCOUNTER — Other Ambulatory Visit: Payer: Medicare Other

## 2018-05-10 ENCOUNTER — Other Ambulatory Visit: Payer: Self-pay | Admitting: Family Medicine

## 2018-05-10 DIAGNOSIS — D508 Other iron deficiency anemias: Secondary | ICD-10-CM

## 2018-05-10 DIAGNOSIS — I10 Essential (primary) hypertension: Secondary | ICD-10-CM | POA: Diagnosis not present

## 2018-05-10 DIAGNOSIS — E7800 Pure hypercholesterolemia, unspecified: Secondary | ICD-10-CM | POA: Diagnosis not present

## 2018-05-11 ENCOUNTER — Other Ambulatory Visit: Payer: Self-pay | Admitting: *Deleted

## 2018-05-11 LAB — CBC WITH DIFFERENTIAL/PLATELET
BASOS ABS: 0 10*3/uL (ref 0.0–0.2)
Basos: 1 %
EOS (ABSOLUTE): 0.1 10*3/uL (ref 0.0–0.4)
Eos: 1 %
HEMATOCRIT: 38.6 % (ref 37.5–51.0)
HEMOGLOBIN: 13 g/dL (ref 13.0–17.7)
Immature Grans (Abs): 0 10*3/uL (ref 0.0–0.1)
Immature Granulocytes: 0 %
LYMPHS ABS: 2.1 10*3/uL (ref 0.7–3.1)
Lymphs: 27 %
MCH: 30.1 pg (ref 26.6–33.0)
MCHC: 33.7 g/dL (ref 31.5–35.7)
MCV: 89 fL (ref 79–97)
Monocytes Absolute: 0.5 10*3/uL (ref 0.1–0.9)
Monocytes: 7 %
Neutrophils Absolute: 4.9 10*3/uL (ref 1.4–7.0)
Neutrophils: 64 %
Platelets: 379 10*3/uL (ref 150–450)
RBC: 4.32 x10E6/uL (ref 4.14–5.80)
RDW: 12.2 % (ref 11.6–15.4)
WBC: 7.7 10*3/uL (ref 3.4–10.8)

## 2018-05-11 LAB — BMP8+EGFR
BUN / CREAT RATIO: 10 (ref 10–24)
BUN: 13 mg/dL (ref 8–27)
CHLORIDE: 101 mmol/L (ref 96–106)
CO2: 22 mmol/L (ref 20–29)
Calcium: 10.8 mg/dL — ABNORMAL HIGH (ref 8.6–10.2)
Creatinine, Ser: 1.26 mg/dL (ref 0.76–1.27)
GFR calc non Af Amer: 57 mL/min/{1.73_m2} — ABNORMAL LOW (ref >59)
GFR, EST AFRICAN AMERICAN: 66 mL/min/{1.73_m2} (ref >59)
GLUCOSE: 180 mg/dL — AB (ref 65–99)
Potassium: 5 mmol/L (ref 3.5–5.2)
SODIUM: 140 mmol/L (ref 134–144)

## 2018-05-11 LAB — LIPID PANEL
CHOL/HDL RATIO: 3.3 ratio (ref 0.0–5.0)
CHOLESTEROL TOTAL: 109 mg/dL (ref 100–199)
HDL: 33 mg/dL — ABNORMAL LOW (ref >39)
LDL CALC: 46 mg/dL (ref 0–99)
TRIGLYCERIDES: 152 mg/dL — AB (ref 0–149)
VLDL Cholesterol Cal: 30 mg/dL (ref 5–40)

## 2018-05-11 LAB — HEPATIC FUNCTION PANEL
ALBUMIN: 4.5 g/dL (ref 3.8–4.8)
ALT: 30 IU/L (ref 0–44)
AST: 24 IU/L (ref 0–40)
Alkaline Phosphatase: 67 IU/L (ref 39–117)
BILIRUBIN TOTAL: 0.4 mg/dL (ref 0.0–1.2)
Bilirubin, Direct: 0.16 mg/dL (ref 0.00–0.40)
Total Protein: 6.6 g/dL (ref 6.0–8.5)

## 2018-05-11 MED ORDER — ICOSAPENT ETHYL 1 G PO CAPS: 2.0000 g | ORAL_CAPSULE | Freq: Two times a day (BID) | ORAL | 2 refills | Status: DC

## 2018-05-17 ENCOUNTER — Ambulatory Visit (INDEPENDENT_AMBULATORY_CARE_PROVIDER_SITE_OTHER): Payer: Medicare Other | Admitting: Family Medicine

## 2018-05-17 ENCOUNTER — Encounter: Payer: Self-pay | Admitting: Family Medicine

## 2018-05-17 VITALS — BP 133/73 | HR 74 | Temp 96.7°F | Ht 70.0 in | Wt 174.0 lb

## 2018-05-17 DIAGNOSIS — E119 Type 2 diabetes mellitus without complications: Secondary | ICD-10-CM

## 2018-05-17 DIAGNOSIS — I10 Essential (primary) hypertension: Secondary | ICD-10-CM | POA: Diagnosis not present

## 2018-05-17 DIAGNOSIS — E039 Hypothyroidism, unspecified: Secondary | ICD-10-CM | POA: Diagnosis not present

## 2018-05-17 DIAGNOSIS — I700 Atherosclerosis of aorta: Secondary | ICD-10-CM

## 2018-05-17 DIAGNOSIS — N400 Enlarged prostate without lower urinary tract symptoms: Secondary | ICD-10-CM | POA: Diagnosis not present

## 2018-05-17 DIAGNOSIS — E559 Vitamin D deficiency, unspecified: Secondary | ICD-10-CM

## 2018-05-17 DIAGNOSIS — E7800 Pure hypercholesterolemia, unspecified: Secondary | ICD-10-CM | POA: Diagnosis not present

## 2018-05-17 NOTE — Progress Notes (Signed)
Subjective:    Patient ID: Matthew Daugherty, male    DOB: 1945-11-07, 73 y.o.   MRN: [id number]  HPI Pt here for follow up and management of chronic medical problems which includes diabetes, hypothyroid and hyperlipidemia. He is taking medication regularly.  Is doing well today with no specific complaints.  He has had lab work done on this will be reviewed with him during the visit today.  The CBC had a normal white blood cell count.  The hemoglobin is slightly improved from previously.  The patient says that he is due to get a colonoscopy this summer which is 5 years from the previous one..  The platelet count was adequate.  The blood sugar was elevated this time at 180.  The creatinine, the most important kidney function test was normal and all of the electrolytes are normal except the serum calcium is slightly elevated at 10.8.  Cholesterol numbers have a triglyceride that was elevated at 152 the bad cholesterol was excellent at 46 and the good cholesterol was low at 30..  All liver function tests are normal.  The patient had a CT of the chest in January 2019.  It was stated at that time the right lower lobe pulmonary nodule has been stable for over 1 year [place] and this was consistent with benign etiology.  Additional pulmonary nodules are stable and consistent with benign etiology.  He does have aortic atherosclerosis.  His last colonoscopy was in August 2015.  This was done by Dr. Watt Climes.  The patient today denies any chest pain pressure tightness or shortness of breath anymore than usual.  He has no trouble with heartburn indigestion nausea vomiting diarrhea blood in the stool or black tarry bowel movements.  He has no change in bowel habits.  He is passing his water well.  He gets his next eye exam in October.    Patient Active Problem List   Diagnosis Date Noted  . Anemia, iron deficiency 04/02/2017  . Thrombocytosis ([place]) 01/26/2017  . History of colon polyps 12/10/2016  . Pulmonary nodule  07/24/2016  . Gallstone pancreatitis s/p lap cholecystectomy 06/22/2015 06/19/2015  . Diabetes mellitus type 2, controlled ([place]) 06/19/2015  . Essential hypertension 06/19/2015  . Pancreatitis 06/19/2015  . Hypothyroid 12/05/2012  . Vitamin D deficiency 12/05/2012  . Type 2 diabetes mellitus ([place]) 12/05/2012  . Hyperlipemia 08/02/2012  . Hypertension   . Cardiac dysrhythmia, unspecified, history of   . BPH (benign prostatic hyperplasia)    Outpatient Encounter Medications as of 05/17/2018  Medication Sig  . aspirin 81 MG EC tablet Take 81 mg by mouth daily.    Marland Kitchen atorvastatin (LIPITOR) 80 MG tablet TAKE 1 TABLET EVERY DAY  AT  6PM  . Cholecalciferol (VITAMIN D-3) 5000 UNITS TABS Take 1 tablet by mouth daily.    Marland Kitchen FeFum-FePoly-FA-B Cmp-C-Biot (INTEGRA PLUS) CAPS TAKE 1 CAPSULE EVERY DAY  . fenofibrate 160 MG tablet TAKE 1 TABLET EVERY DAY  . levothyroxine (SYNTHROID, LEVOTHROID) 100 MCG tablet Take 1 tablet (100 mcg total) by mouth daily before breakfast.  . losartan-hydrochlorothiazide (HYZAAR) 100-25 MG tablet TAKE 1 TABLET EVERY DAY  . metFORMIN (GLUCOPHAGE) 1000 MG tablet Take 1 tablet (1,000 mg total) by mouth 2 (two) times daily with a meal.  . Multiple Vitamin (MULTIVITAMIN WITH MINERALS) TABS tablet Take 1 tablet by mouth daily.  . [DISCONTINUED] Icosapent Ethyl 1 g CAPS Take 2 capsules (2 g total) by mouth 2 (two) times daily.   No facility-administered encounter  medications on file as of 05/17/2018.      Review of Systems  Constitutional: Negative.   HENT: Negative.   Eyes: Negative.   Respiratory: Negative.   Cardiovascular: Negative.   Gastrointestinal: Negative.   Endocrine: Negative.   Genitourinary: Negative.   Musculoskeletal: Negative.   Skin: Negative.   Allergic/Immunologic: Negative.   Neurological: Negative.   Hematological: Negative.   Psychiatric/Behavioral: Negative.        Objective:   Physical Exam Vitals signs and nursing note reviewed.    Constitutional:      Appearance: Normal appearance. He is well-developed and normal weight. He is not ill-appearing.     Comments: Is pleasant and alert  HENT:     Head: Normocephalic and atraumatic.     Right Ear: Tympanic membrane, ear canal and external ear normal. There is no impacted cerumen.     Left Ear: Tympanic membrane, ear canal and external ear normal.     Nose: Nose normal. No congestion.     Mouth/Throat:     Mouth: Mucous membranes are moist.     Pharynx: Oropharynx is clear. No oropharyngeal exudate.  Eyes:     General: No scleral icterus.       Right eye: No discharge.        Left eye: No discharge.     Extraocular Movements: Extraocular movements intact.     Conjunctiva/sclera: Conjunctivae normal.     Pupils: Pupils are equal, round, and reactive to light.  Neck:     Musculoskeletal: Normal range of motion and neck supple.     Thyroid: No thyromegaly.     Vascular: No carotid bruit.     Trachea: No tracheal deviation.  Cardiovascular:     Rate and Rhythm: Normal rate and regular rhythm.     Pulses: Normal pulses.     Heart sounds: Normal heart sounds. No murmur.     Comments: The heart is regular at 60/min Pulmonary:     Effort: Pulmonary effort is normal. No respiratory distress.     Breath sounds: Normal breath sounds. No wheezing or rales.     Comments: Clear anteriorly and posteriorly and no axillary adenopathy or chest wall masses Chest:     Chest wall: No tenderness.  Abdominal:     General: Abdomen is flat. Bowel sounds are normal.     Palpations: Abdomen is soft. There is no mass.     Tenderness: There is no abdominal tenderness.     Comments: No liver or spleen enlargement no epigastric tenderness no masses no inguinal adenopathy and no bruits  Musculoskeletal: Normal range of motion.        General: No tenderness.     Right lower leg: No edema.     Left lower leg: No edema.  Lymphadenopathy:     Cervical: No cervical adenopathy.  Skin:     General: Skin is warm and dry.     Findings: No rash.  Neurological:     General: No focal deficit present.     Mental Status: He is alert and oriented to person, place, and time. Mental status is at baseline.     Cranial Nerves: No cranial nerve deficit.     Gait: Gait normal.     Deep Tendon Reflexes: Reflexes are normal and symmetric. Reflexes normal.  Psychiatric:        Mood and Affect: Mood normal.        Behavior: Behavior normal.  Thought Content: Thought content normal.        Judgment: Judgment normal.     Comments: Mood affect and behavior are normal and positive for this patient.    BP 133/73 (BP Location: Left Arm)   Pulse 74   Temp (!) 96.7 F (35.9 C) (Oral)   Ht 5\' 10"  (1.778 m)   Wt 174 lb (78.9 kg)   BMI 24.97 kg/m        Assessment & Plan:  1. Type 2 diabetes mellitus without complication, without long-term current use of insulin (HCC) -Hemoglobin A1c was added to recent blood work.  Patient admits to not exercising as much and plans to get back into a better regimen of diet and exercise  2. Hypothyroidism, unspecified type -Continue current treatment  3. Pure hypercholesterolemia -Triglycerides were elevated and LDL-C was still good.  4. Hypertension -Blood pressure is good today and he will continue with current treatment  5. Vitamin D deficiency -Continue with vitamin D replacement  6. Benign prostatic hyperplasia, unspecified whether lower urinary tract symptoms present -No voiding symptoms  7. Aortic atherosclerosis ([place]) -Continue with aggressive therapeutic lifestyle changes including diet and exercise  Patient Instructions                       Medicare Annual Wellness Visit  [facility] and the medical providers at [facility] strive to bring you the best medical care.  In doing so we not only want to address your current medical conditions and concerns but also to detect new conditions early and prevent  illness, disease and health-related problems.    Medicare offers a yearly Wellness Visit which allows our clinical staff to assess your need for preventative services including immunizations, lifestyle education, counseling to decrease risk of preventable diseases and screening for fall risk and other medical concerns.    This visit is provided free of charge (no copay) for all Medicare recipients. The clinical pharmacists at [facility] have begun to conduct these Wellness Visits which will also include a thorough review of all your medications.    As you primary medical provider recommend that you make an appointment for your Annual Wellness Visit if you have not done so already this year.  You may set up this appointment before you leave today or you may call back ([phone]) and schedule an appointment.  Please make sure when you call that you mention that you are scheduling your Annual Wellness Visit with the clinical pharmacist so that the appointment may be made for the proper length of time.     Continue current medications. Continue good therapeutic lifestyle changes which include good diet and exercise. Fall precautions discussed with patient. If an FOBT was given today- please return it to our front desk. If you are over 52 years old - you may need Prevnar 20 or the adult Pneumonia vaccine.  **Flu shots are available--- please call and schedule a FLU-CLINIC appointment**  After your visit with [place] today you will receive a survey in the mail or online from [employer] regarding your care with [place]. Please take a moment to fill this out. Your feedback is very important to [place] as you can help [place] better understand your patient needs as well as improve your experience and satisfaction. WE CARE ABOUT YOU!!!   Try to do better with therapeutic lifestyle changes exercise and diet We will call with the results of the hemoglobin A1c which  is the average blood sugar test for the  past 3 months as soon as that result is returned Drink plenty of fluids and stay well-hydrated Practice good hand hygiene and respiratory hygiene Keep feet checked regularly and use moisturizer to keep the skin from being so dry  Arrie Senate MD

## 2018-05-17 NOTE — Patient Instructions (Addendum)
Medicare Annual Wellness Visit  [facility] and the medical providers at [facility] strive to bring you the best medical care.  In doing so we not only want to address your current medical conditions and concerns but also to detect new conditions early and prevent illness, disease and health-related problems.    Medicare offers a yearly Wellness Visit which allows our clinical staff to assess your need for preventative services including immunizations, lifestyle education, counseling to decrease risk of preventable diseases and screening for fall risk and other medical concerns.    This visit is provided free of charge (no copay) for all Medicare recipients. The clinical pharmacists at [facility] have begun to conduct these Wellness Visits which will also include a thorough review of all your medications.    As you primary medical provider recommend that you make an appointment for your Annual Wellness Visit if you have not done so already this year.  You may set up this appointment before you leave today or you may call back ([phone]) and schedule an appointment.  Please make sure when you call that you mention that you are scheduling your Annual Wellness Visit with the clinical pharmacist so that the appointment may be made for the proper length of time.     Continue current medications. Continue good therapeutic lifestyle changes which include good diet and exercise. Fall precautions discussed with patient. If an FOBT was given today- please return it to our front desk. If you are over 37 years old - you may need Prevnar 26 or the adult Pneumonia vaccine.  **Flu shots are available--- please call and schedule a FLU-CLINIC appointment**  After your visit with [place] today you will receive a survey in the mail or online from [employer] regarding your care with [place]. Please take a moment to fill this out. Your feedback is very  important to [place] as you can help [place] better understand your patient needs as well as improve your experience and satisfaction. WE CARE ABOUT YOU!!!   Try to do better with therapeutic lifestyle changes exercise and diet We will call with the results of the hemoglobin A1c which is the average blood sugar test for the past 3 months as soon as that result is returned Drink plenty of fluids and stay well-hydrated Practice good hand hygiene and respiratory hygiene Keep feet checked regularly and use moisturizer to keep the skin from being so dry

## 2018-05-18 ENCOUNTER — Other Ambulatory Visit: Payer: Medicare Other

## 2018-05-18 DIAGNOSIS — Z1211 Encounter for screening for malignant neoplasm of colon: Secondary | ICD-10-CM

## 2018-05-19 LAB — FECAL OCCULT BLOOD, IMMUNOCHEMICAL: Fecal Occult Bld: NEGATIVE

## 2018-05-20 LAB — SPECIMEN STATUS REPORT

## 2018-05-30 ENCOUNTER — Ambulatory Visit (INDEPENDENT_AMBULATORY_CARE_PROVIDER_SITE_OTHER): Payer: Medicare Other | Admitting: *Deleted

## 2018-05-30 ENCOUNTER — Encounter: Payer: Self-pay | Admitting: *Deleted

## 2018-05-30 VITALS — BP 122/81 | HR 94 | Ht 70.0 in | Wt 175.0 lb

## 2018-05-30 DIAGNOSIS — Z0000 Encounter for general adult medical examination without abnormal findings: Secondary | ICD-10-CM | POA: Diagnosis not present

## 2018-05-30 NOTE — Progress Notes (Addendum)
Subjective:   Matthew Daugherty is a 73 y.o. male who presents for a subsequent Medicare Annual Wellness Visit.  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  He lives at home with his wife.  He has 1 son and 2 grand daughters.  Matthew Daugherty enjoys attending church, gardening and working around his home.    Patient Care Team: Chipper Herb, MD as PCP - General (Family Medicine) Clarene Essex, MD (Gastroenterology) Minus Breeding, MD as Consulting Physician (Cardiology)  Hospitalizations, surgeries, and ER visits in previous 12 months No hospitalizations, ER visits, or surgeries this past year.   Review of Systems    Patient reports that his overall health is unchanged compared to last year.  Cardiac Risk Factors include: advanced age (>59men, >38 women);diabetes mellitus;dyslipidemia;hypertension;male gender  All other systems negative       Current Medications (verified) Outpatient Encounter Medications as of 05/30/2018  Medication Sig  . aspirin 81 MG EC tablet Take 81 mg by mouth daily.    Marland Kitchen atorvastatin (LIPITOR) 80 MG tablet TAKE 1 TABLET EVERY DAY  AT  6PM  . Cholecalciferol (VITAMIN D-3) 5000 UNITS TABS Take 1 tablet by mouth daily.    Marland Kitchen FeFum-FePoly-FA-B Cmp-C-Biot (INTEGRA PLUS) CAPS TAKE 1 CAPSULE EVERY DAY  . fenofibrate 160 MG tablet TAKE 1 TABLET EVERY DAY  . levothyroxine (SYNTHROID, LEVOTHROID) 100 MCG tablet Take 1 tablet (100 mcg total) by mouth daily before breakfast.  . losartan-hydrochlorothiazide (HYZAAR) 100-25 MG tablet TAKE 1 TABLET EVERY DAY  . metFORMIN (GLUCOPHAGE) 1000 MG tablet Take 1 tablet (1,000 mg total) by mouth 2 (two) times daily with a meal.  . Multiple Vitamin (MULTIVITAMIN WITH MINERALS) TABS tablet Take 1 tablet by mouth daily.   No facility-administered encounter medications on file as of 05/30/2018.     Allergies (verified) Niaspan [niacin er]   History: Past Medical History:  Diagnosis Date    . Cardiac dysrhythmia, unspecified   . Diabetes mellitus without complication ([facility])   . Essential hypertension, benign   . Hyperplasia of prostate   . Other and unspecified hyperlipidemia   . Thyroid disease   . Unspecified transient cerebral ischemia    Past Surgical History:  Procedure Laterality Date  . APPENDECTOMY    . CHOLECYSTECTOMY    . LAPAROSCOPIC CHOLECYSTECTOMY SINGLE SITE WITH INTRAOPERATIVE CHOLANGIOGRAM N/A 06/22/2015   Procedure: LAPAROSCOPIC CHOLECYSTECTOMY SINGLE SITE WITH INTRAOPERATIVE CHOLANGIOGRAM, PRIMARY UMBILICAL HERNIA REPAIR;  Surgeon: Michael Boston, MD;  Location: [facility];  Service: General;  Laterality: N/A;   Family History  Problem Relation Age of Onset  . Cancer Mother        bone  . Asthma Father   . Heart attack Father        62   Social History   Socioeconomic History  . Marital status: Married    Spouse name: Matthew Daugherty  . Number of children: 1  . Years of education: Not on file  . Highest education level: High school graduate  Occupational History  . Occupation: retired    Fish farm manager: [employer]  Social Needs  . Financial resource strain: Not hard at all  . Food insecurity:    Worry: Never true    Inability: Never true  . Transportation needs:    Medical: No    Non-medical: No  Tobacco Use  . Smoking status: Former Smoker    Packs/day: 1.00    Years: 44.00    Pack years: 44.00  Types: Cigarettes    Start date: 04/20/1954    Last attempt to quit: 06/13/1998    Years since quitting: 19.9  . Smokeless tobacco: Never Used  Substance and Sexual Activity  . Alcohol use: No  . Drug use: No  . Sexual activity: Not on file  Lifestyle  . Physical activity:    Days per week: 3 days    Minutes per session: 40 min  . Stress: Not at all  Relationships  . Social connections:    Talks on phone: More than three times a week    Gets together: More than three times a week    Attends religious service: More than 4 times per year    Active member of  club or organization: No    Attends meetings of clubs or organizations: Never    Relationship status: Married  Other Topics Concern  . Not on file  Social History Narrative   Patient lives with his wife in a 2 story home.  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  He has one son, and 2 grand daughters.     Clinical Intake:     Pain Score: 0-No pain                  Activities of Daily Living In your present state of health, do you have any difficulty performing the following activities: 05/30/2018  Hearing? N  Vision? N  Difficulty concentrating or making decisions? N  Walking or climbing stairs? N  Dressing or bathing? N  Doing errands, shopping? N  Preparing Food and eating ? N  Using the Toilet? N  In the past six months, have you accidently leaked urine? N  Do you have problems with loss of bowel control? N  Managing your Medications? N  Managing your Finances? N  Housekeeping or managing your Housekeeping? N  Some recent data might be hidden     Exercise Current Exercise Habits: Home exercise routine, Type of exercise: treadmill, Time (Minutes): 35, Frequency (Times/Week): 3, Weekly Exercise (Minutes/Week): 105, Intensity: Moderate  Diet Consumes 3 meals a day and 0 snacks a day.  The patient feels that they mostly follow a Diabetic diet.  Diet History no problem areas noted.  Patient states he eats mostly lean protein, vegetables, and whole grains.  He has access to all the food he needs.    Depression Screen PHQ 2/9 Scores 05/30/2018 05/17/2018 01/06/2018 09/02/2017 05/25/2017 04/30/2017 12/10/2016  PHQ - 2 Score 0 0 0 0 0 0 0     Fall Risk Fall Risk  05/30/2018 05/17/2018 01/06/2018 09/02/2017 05/25/2017  Falls in the past year? 0 0 No No No  Risk for fall due to : - - - - -     Objective:    Today's Vitals   05/30/18 0832  BP: 122/81  Pulse: 94  Weight: 175 lb (79.4 kg)  Height: 5\' 10"  (1.778 m)    PainSc: 0-No pain   Body mass index is 25.11 kg/m.  Advanced Directives 05/30/2018 05/25/2017 02/22/2017 01/26/2017 12/27/2015 06/20/2015  Does Patient Have a Medical Advance Directive? Yes No Yes Yes Yes No  Type of Advance Directive Healthcare Power of [place] -  Does patient want to make changes to medical advance directive? No - Patient declined - No - Patient declined No - Patient declined No - Patient declined -  Copy of [place] in Chart? No - copy requested - No - copy requested No - copy requested No - copy requested -  Would patient like information on creating a medical advance directive? - Yes (MAU/Ambulatory/Procedural Areas - Information given) - - - -    Hearing/Vision  No hearing or vision deficits noted during visit.  Cognitive Function: MMSE - Mini Mental State Exam 05/30/2018 05/25/2017 12/27/2015  Orientation to time 5 5 5   Orientation to Place 5 5 5   Registration 3 3 3   Attention/ Calculation 4 5 5   Recall 3 3 3   Language- name 2 objects 2 2 2   Language- repeat 1 1 1   Language- follow 3 step command 3 3 3   Language- read & follow direction 1 1 1   Write a sentence 1 1 1   Copy design 1 1 1   Total score 29 30 30           Immunizations and Health Maintenance Immunization History  Administered Date(s) Administered  . Influenza Whole 11/18/2008  . Influenza, High Dose Seasonal PF 02/22/2015, 03/09/2016, 04/30/2017, 02/17/2018  . Influenza,inj,Quad PF,6+ Mos 04/11/2013, 04/19/2014  . Pneumococcal Conjugate-13 04/11/2013  . Pneumococcal Polysaccharide-23 04/19/2014  . Tdap 06/18/2008  . Zoster 04/30/2010  Patient declined Shingrix vaccine today.  Patient plans to get vaccine later in the year if the price decreases.   Health Maintenance Due  Topic Date Due  . OPHTHALMOLOGY EXAM  02/02/2018  Patient states he has eye exam 01/2018 at [facility].   Called their office and requested records to be faxed to our office.   Health Maintenance  Topic Date Due  . OPHTHALMOLOGY EXAM  02/02/2018  . Hepatitis C Screening  07/25/2022 (Originally 16-Jun-1945)  . TETANUS/TDAP  06/19/2018  . HEMOGLOBIN A1C  08/18/2018  . COLONOSCOPY  11/23/2018  . FOOT EXAM  05/18/2019  . COLON CANCER SCREENING ANNUAL FOBT  05/19/2019  . INFLUENZA VACCINE  Completed  . PNA vac Low Risk Adult  Completed        Assessment:   This is a routine wellness examination for Gerasimos.    Plan:    Goals    . Exercise 3x per week (30 min per time)     Walk for 30 minutes at least 3 times weekly    . Increase physical activity    . Patient Stated     Continue healthy diet and exercise habits.    . Prevent falls        Health Maintenance & Additional Screening Recommendations:  Lung: Low Dose CT Chest recommended if Age 79-80 years, 30 pack-year currently smoking OR have quit w/in 15years. Patient does not qualify. Hepatitis C Screening recommended: yes  Today's Orders No orders of the defined types were placed in this encounter.   Keep f/u with Chipper Herb, MD and any other specialty appointments you may have Continue current medications Move carefully to avoid falls.  Aim for at least 150 minutes of moderate activity a week.  Read or work on puzzles daily Stay connected with friends and family  I have personally reviewed and noted the following in the patient's chart:   . Medical and social history . Use of alcohol, tobacco or illicit drugs  . Current medications and supplements . Functional ability and status . Nutritional status . Physical activity . Advanced directives . List of other physicians . Hospitalizations, surgeries, and ER visits in previous 12 months . Vitals . Screenings to include  cognitive, depression, and falls . Referrals and appointments  In addition, I have reviewed and discussed with patient certain preventive  protocols, quality metrics, and best practice recommendations. A written personalized care plan for preventive services as well as general preventive health recommendations were provided to patient.     Nolberto Hanlon, RN  05/30/2018   I have reviewed and agree with the above AWV documentation.   Arrie Senate MD

## 2018-05-30 NOTE — Patient Instructions (Signed)
Please review the information given on Advance Directives.  If you complete the paperwork, please bring a copy to our office to be filed in your medical record.   Please continue your healthy diet and exercise habits.   You will need a tetanus vaccine after 06/19/2018.    Please consider getting the Shingrix (Shingles) vaccine in the future.   Thank you for coming in for your Annual Wellness Visit today!!  Preventive Care 71 Years and Older, Male Preventive care refers to lifestyle choices and visits with your health care provider that can promote health and wellness. What does preventive care include?   A yearly physical exam. This is also called an annual well check.  Dental exams once or twice a year.  Routine eye exams. Ask your health care provider how often you should have your eyes checked.  Personal lifestyle choices, including: ? Daily care of your teeth and gums. ? Regular physical activity. ? Eating a healthy diet. ? Avoiding tobacco and drug use. ? Limiting alcohol use. ? Practicing safe sex. ? Taking low doses of aspirin every day. ? Taking vitamin and mineral supplements as recommended by your health care provider. What happens during an annual well check? The services and screenings done by your health care provider during your annual well check will depend on your age, overall health, lifestyle risk factors, and family history of disease. Counseling Your health care provider may ask you questions about your:  Alcohol use.  Tobacco use.  Drug use.  Emotional well-being.  Home and relationship well-being.  Sexual activity.  Eating habits.  History of falls.  Memory and ability to understand (cognition).  Work and work Statistician. Screening You may have the following tests or measurements:  Height, weight, and BMI.  Blood pressure.  Lipid and cholesterol levels. These may be checked every 5 years, or more frequently if you are over 12 years  old.  Skin check.  Lung cancer screening. You may have this screening every year starting at age 71 if you have a 30-pack-year history of smoking and currently smoke or have quit within the past 15 years.  Colorectal cancer screening. All adults should have this screening starting at age 71 and continuing until age 44. You will have tests every 1-10 years, depending on your results and the type of screening test. People at increased risk should start screening at an earlier age. Screening tests may include: ? Guaiac-based fecal occult blood testing. ? Fecal immunochemical test (FIT). ? Stool DNA test. ? Virtual colonoscopy. ? Sigmoidoscopy. During this test, a flexible tube with a tiny camera (sigmoidoscope) is used to examine your rectum and lower colon. The sigmoidoscope is inserted through your anus into your rectum and lower colon. ? Colonoscopy. During this test, a long, thin, flexible tube with a tiny camera (colonoscope) is used to examine your entire colon and rectum.  Prostate cancer screening. Recommendations will vary depending on your family history and other risks.  Hepatitis C blood test.  Hepatitis B blood test.  Sexually transmitted disease (STD) testing.  Diabetes screening. This is done by checking your blood sugar (glucose) after you have not eaten for a while (fasting). You may have this done every 1-3 years.  Abdominal aortic aneurysm (AAA) screening. You may need this if you are a current or former smoker.  Osteoporosis. You may be screened starting at age 71 if you are at high risk. Talk with your health care provider about your test results, treatment options,  and if necessary, the need for more tests. Vaccines Your health care provider may recommend certain vaccines, such as:  Influenza vaccine. This is recommended every year.  Tetanus, diphtheria, and acellular pertussis (Tdap, Td) vaccine. You may need a Td booster every 10 years.  Varicella vaccine. You  may need this if you have not been vaccinated.  Zoster vaccine. You may need this after age 71.  Measles, mumps, and rubella (MMR) vaccine. You may need at least one dose of MMR if you were born in 1957 or later. You may also need a second dose.  Pneumococcal 13-valent conjugate (PCV13) vaccine. One dose is recommended after age 71.  Pneumococcal polysaccharide (PPSV23) vaccine. One dose is recommended after age 71.  Meningococcal vaccine. You may need this if you have certain conditions.  Hepatitis A vaccine. You may need this if you have certain conditions or if you travel or work in places where you may be exposed to hepatitis A.  Hepatitis B vaccine. You may need this if you have certain conditions or if you travel or work in places where you may be exposed to hepatitis B.  Haemophilus influenzae type b (Hib) vaccine. You may need this if you have certain risk factors. Talk to your health care provider about which screenings and vaccines you need and how often you need them. This information is not intended to replace advice given to you by your health care provider. Make sure you discuss any questions you have with your health care provider. Document Released: 05/03/2015 Document Revised: 05/27/2017 Document Reviewed: 02/05/2015 Elsevier Interactive Patient Education  2019 [place].   Diabetes Mellitus and Nutrition, Adult When you have diabetes (diabetes mellitus), it is very important to have healthy eating habits because your blood sugar (glucose) levels are greatly affected by what you eat and drink. Eating healthy foods in the appropriate amounts, at about the same times every day, can help you:  Control your blood glucose.  Lower your risk of heart disease.  Improve your blood pressure.  Reach or maintain a healthy weight. Every person with diabetes is different, and each person has different needs for a meal plan. Your health care provider may recommend that you work  with a diet and nutrition specialist (dietitian) to make a meal plan that is best for you. Your meal plan may vary depending on factors such as:  The calories you need.  The medicines you take.  Your weight.  Your blood glucose, blood pressure, and cholesterol levels.  Your activity level.  Other health conditions you have, such as heart or kidney disease. How do carbohydrates affect me? Carbohydrates, also called carbs, affect your blood glucose level more than any other type of food. Eating carbs naturally raises the amount of glucose in your blood. Carb counting is a method for keeping track of how many carbs you eat. Counting carbs is important to keep your blood glucose at a healthy level, especially if you use insulin or take certain oral diabetes medicines. It is important to know how many carbs you can safely have in each meal. This is different for every person. Your dietitian can help you calculate how many carbs you should have at each meal and for each snack. Foods that contain carbs include:  Bread, cereal, rice, pasta, and crackers.  Potatoes and corn.  Peas, beans, and lentils.  Milk and yogurt.  Fruit and juice.  Desserts, such as cakes, cookies, ice cream, and candy. How does alcohol affect me?  Alcohol can cause a sudden decrease in blood glucose (hypoglycemia), especially if you use insulin or take certain oral diabetes medicines. Hypoglycemia can be a life-threatening condition. Symptoms of hypoglycemia (sleepiness, dizziness, and confusion) are similar to symptoms of having too much alcohol. If your health care provider says that alcohol is safe for you, follow these guidelines:  Limit alcohol intake to no more than 1 drink per day for nonpregnant women and 2 drinks per day for men. One drink equals 12 oz of beer, 5 oz of wine, or 1 oz of hard liquor.  Do not drink on an empty stomach.  Keep yourself hydrated with water, diet soda, or unsweetened iced  tea.  Keep in mind that regular soda, juice, and other mixers may contain a lot of sugar and must be counted as carbs. What are tips for following this plan?  Reading food labels  Start by checking the serving size on the "Nutrition Facts" label of packaged foods and drinks. The amount of calories, carbs, fats, and other nutrients listed on the label is based on one serving of the item. Many items contain more than one serving per package.  Check the total grams (g) of carbs in one serving. You can calculate the number of servings of carbs in one serving by dividing the total carbs by 15. For example, if a food has 30 g of total carbs, it would be equal to 2 servings of carbs.  Check the number of grams (g) of saturated and trans fats in one serving. Choose foods that have low or no amount of these fats.  Check the number of milligrams (mg) of salt (sodium) in one serving. Most people should limit total sodium intake to less than 2,300 mg per day.  Always check the nutrition information of foods labeled as "low-fat" or "nonfat". These foods may be higher in added sugar or refined carbs and should be avoided.  Talk to your dietitian to identify your daily goals for nutrients listed on the label. Shopping  Avoid buying canned, premade, or processed foods. These foods tend to be high in fat, sodium, and added sugar.  Shop around the outside edge of the grocery store. This includes fresh fruits and vegetables, bulk grains, fresh meats, and fresh dairy. Cooking  Use low-heat cooking methods, such as baking, instead of high-heat cooking methods like deep frying.  Cook using healthy oils, such as olive, canola, or sunflower oil.  Avoid cooking with butter, cream, or high-fat meats. Meal planning  Eat meals and snacks regularly, preferably at the same times every day. Avoid going long periods of time without eating.  Eat foods high in fiber, such as fresh fruits, vegetables, beans, and  whole grains. Talk to your dietitian about how many servings of carbs you can eat at each meal.  Eat 4-6 ounces (oz) of lean protein each day, such as lean meat, chicken, fish, eggs, or tofu. One oz of lean protein is equal to: ? 1 oz of meat, chicken, or fish. ? 1 egg. ?  cup of tofu.  Eat some foods each day that contain healthy fats, such as avocado, nuts, seeds, and fish. Lifestyle  Check your blood glucose regularly.  Exercise regularly as told by your health care provider. This may include: ? 150 minutes of moderate-intensity or vigorous-intensity exercise each week. This could be brisk walking, biking, or water aerobics. ? Stretching and doing strength exercises, such as yoga or weightlifting, at least 2 times a week.  Take medicines as told by your health care provider.  Do not use any products that contain nicotine or tobacco, such as cigarettes and e-cigarettes. If you need help quitting, ask your health care provider.  Work with a Social worker or diabetes educator to identify strategies to manage stress and any emotional and social challenges. Questions to ask a health care provider  Do I need to meet with a diabetes educator?  Do I need to meet with a dietitian?  What number can I call if I have questions?  When are the best times to check my blood glucose? Where to find more information:  American Diabetes Association: diabetes.org  Academy of Nutrition and Dietetics: www.eatright.[employer] of Diabetes and Digestive and Kidney Diseases (NIH): [employer] Summary  A healthy meal plan will help you control your blood glucose and maintain a healthy lifestyle.  Working with a diet and nutrition specialist (dietitian) can help you make a meal plan that is best for you.  Keep in mind that carbohydrates (carbs) and alcohol have immediate effects on your blood glucose levels. It is important to count carbs and to use alcohol carefully. This  information is not intended to replace advice given to you by your health care provider. Make sure you discuss any questions you have with your health care provider. Document Released: 01/01/2005 Document Revised: 11/04/2016 Document Reviewed: 05/11/2016 Elsevier Interactive Patient Education  2019 [employer].

## 2018-06-27 ENCOUNTER — Other Ambulatory Visit: Payer: Self-pay | Admitting: Family Medicine

## 2018-07-11 ENCOUNTER — Other Ambulatory Visit: Payer: Self-pay | Admitting: Family Medicine

## 2018-08-16 ENCOUNTER — Other Ambulatory Visit: Payer: Self-pay | Admitting: Family Medicine

## 2018-09-01 ENCOUNTER — Telehealth: Payer: Self-pay | Admitting: Family Medicine

## 2018-09-05 ENCOUNTER — Other Ambulatory Visit: Payer: Self-pay | Admitting: Family Medicine

## 2018-09-08 ENCOUNTER — Other Ambulatory Visit: Payer: Self-pay

## 2018-09-08 ENCOUNTER — Other Ambulatory Visit: Payer: Medicare Other

## 2018-09-08 DIAGNOSIS — E559 Vitamin D deficiency, unspecified: Secondary | ICD-10-CM | POA: Diagnosis not present

## 2018-09-08 DIAGNOSIS — I10 Essential (primary) hypertension: Secondary | ICD-10-CM | POA: Diagnosis not present

## 2018-09-08 DIAGNOSIS — E7800 Pure hypercholesterolemia, unspecified: Secondary | ICD-10-CM

## 2018-09-08 DIAGNOSIS — I700 Atherosclerosis of aorta: Secondary | ICD-10-CM | POA: Diagnosis not present

## 2018-09-08 DIAGNOSIS — E039 Hypothyroidism, unspecified: Secondary | ICD-10-CM

## 2018-09-08 DIAGNOSIS — E119 Type 2 diabetes mellitus without complications: Secondary | ICD-10-CM | POA: Diagnosis not present

## 2018-09-08 LAB — BAYER DCA HB A1C WAIVED: HB A1C (BAYER DCA - WAIVED): 6.8 % (ref <7.0)

## 2018-09-09 LAB — LIPID PANEL
Chol/HDL Ratio: 2.9 ratio (ref 0.0–5.0)
Cholesterol, Total: 106 mg/dL (ref 100–199)
HDL: 36 mg/dL — ABNORMAL LOW (ref >39)
LDL Calculated: 52 mg/dL (ref 0–99)
Triglycerides: 91 mg/dL (ref 0–149)
VLDL Cholesterol Cal: 18 mg/dL (ref 5–40)

## 2018-09-09 LAB — CBC WITH DIFFERENTIAL/PLATELET
Basophils Absolute: 0 10*3/uL (ref 0.0–0.2)
Basos: 0 %
EOS (ABSOLUTE): 0.1 10*3/uL (ref 0.0–0.4)
Eos: 1 %
Hematocrit: 38.5 % (ref 37.5–51.0)
Hemoglobin: 12.8 g/dL — ABNORMAL LOW (ref 13.0–17.7)
Immature Grans (Abs): 0 10*3/uL (ref 0.0–0.1)
Immature Granulocytes: 0 %
Lymphocytes Absolute: 2.3 10*3/uL (ref 0.7–3.1)
Lymphs: 33 %
MCH: 29.7 pg (ref 26.6–33.0)
MCHC: 33.2 g/dL (ref 31.5–35.7)
MCV: 89 fL (ref 79–97)
Monocytes Absolute: 0.5 10*3/uL (ref 0.1–0.9)
Monocytes: 7 %
Neutrophils Absolute: 4.1 10*3/uL (ref 1.4–7.0)
Neutrophils: 59 %
Platelets: 354 10*3/uL (ref 150–450)
RBC: 4.31 x10E6/uL (ref 4.14–5.80)
RDW: 12.7 % (ref 11.6–15.4)
WBC: 6.9 10*3/uL (ref 3.4–10.8)

## 2018-09-09 LAB — THYROID PANEL WITH TSH
Free Thyroxine Index: 3.1 (ref 1.2–4.9)
T3 Uptake Ratio: 27 % (ref 24–39)
T4, Total: 11.4 ug/dL (ref 4.5–12.0)
TSH: 2.68 u[IU]/mL (ref 0.450–4.500)

## 2018-09-09 LAB — BMP8+EGFR
BUN/Creatinine Ratio: 11 (ref 10–24)
BUN: 14 mg/dL (ref 8–27)
CO2: 22 mmol/L (ref 20–29)
Calcium: 10.1 mg/dL (ref 8.6–10.2)
Chloride: 101 mmol/L (ref 96–106)
Creatinine, Ser: 1.3 mg/dL — ABNORMAL HIGH (ref 0.76–1.27)
GFR calc Af Amer: 64 mL/min/{1.73_m2} (ref >59)
GFR calc non Af Amer: 55 mL/min/{1.73_m2} — ABNORMAL LOW (ref >59)
Glucose: 149 mg/dL — ABNORMAL HIGH (ref 65–99)
Potassium: 4.7 mmol/L (ref 3.5–5.2)
Sodium: 138 mmol/L (ref 134–144)

## 2018-09-09 LAB — HEPATIC FUNCTION PANEL
ALT: 34 IU/L (ref 0–44)
AST: 32 IU/L (ref 0–40)
Albumin: 4.7 g/dL (ref 3.8–4.8)
Alkaline Phosphatase: 55 IU/L (ref 39–117)
Bilirubin Total: 0.4 mg/dL (ref 0.0–1.2)
Bilirubin, Direct: 0.21 mg/dL (ref 0.00–0.40)
Total Protein: 6.7 g/dL (ref 6.0–8.5)

## 2018-09-09 LAB — VITAMIN D 25 HYDROXY (VIT D DEFICIENCY, FRACTURES): Vit D, 25-Hydroxy: 52.8 ng/mL (ref 30.0–100.0)

## 2018-09-15 ENCOUNTER — Other Ambulatory Visit: Payer: Self-pay

## 2018-09-15 ENCOUNTER — Ambulatory Visit (INDEPENDENT_AMBULATORY_CARE_PROVIDER_SITE_OTHER): Payer: Medicare Other | Admitting: Family Medicine

## 2018-09-15 ENCOUNTER — Encounter: Payer: Self-pay | Admitting: Family Medicine

## 2018-09-15 DIAGNOSIS — E559 Vitamin D deficiency, unspecified: Secondary | ICD-10-CM | POA: Diagnosis not present

## 2018-09-15 DIAGNOSIS — R9389 Abnormal findings on diagnostic imaging of other specified body structures: Secondary | ICD-10-CM

## 2018-09-15 DIAGNOSIS — N400 Enlarged prostate without lower urinary tract symptoms: Secondary | ICD-10-CM | POA: Diagnosis not present

## 2018-09-15 DIAGNOSIS — D508 Other iron deficiency anemias: Secondary | ICD-10-CM

## 2018-09-15 DIAGNOSIS — E782 Mixed hyperlipidemia: Secondary | ICD-10-CM | POA: Diagnosis not present

## 2018-09-15 DIAGNOSIS — E119 Type 2 diabetes mellitus without complications: Secondary | ICD-10-CM | POA: Diagnosis not present

## 2018-09-15 DIAGNOSIS — I10 Essential (primary) hypertension: Secondary | ICD-10-CM

## 2018-09-15 DIAGNOSIS — E039 Hypothyroidism, unspecified: Secondary | ICD-10-CM

## 2018-09-15 DIAGNOSIS — I700 Atherosclerosis of aorta: Secondary | ICD-10-CM | POA: Diagnosis not present

## 2018-09-15 NOTE — Patient Instructions (Signed)
Please continue with aggressive therapeutic lifestyle changes Always eat healthy and get plenty of exercise and make all efforts to keep weight down through diet and exercise Check blood sugars regularly Get colonoscopies regularly Get periodic chest x-rays Always keep feet checked Keep eyes checked every year

## 2018-09-15 NOTE — Progress Notes (Signed)
Virtual Visit Via telephone Note I connected with@ on 09/15/18 by telephone and verified that I am speaking with the correct person or authorized healthcare agent using two identifiers. Matthew Daugherty is currently located at home and there are no unauthorized people in close proximity. I completed this visit while in a private location in my home .  This visit type was conducted due to national recommendations for restrictions regarding the COVID-19 Pandemic (e.g. social distancing).  This format is felt to be most appropriate for this patient at this time.  All issues noted in this document were discussed and addressed.  No physical exam was performed.    I discussed the limitations, risks, security and privacy concerns of performing an evaluation and management service by telephone and the availability of in person appointments. I also discussed with the patient that there may be a patient responsible charge related to this service. The patient expressed understanding and agreed to proceed.   Date:  09/15/2018    ID:  Matthew Daugherty      12-11-1945        [id number]   Patient Care Team Patient Care Team: Chipper Herb, MD as PCP - General (Family Medicine) Clarene Essex, MD (Gastroenterology) Minus Breeding, MD as Consulting Physician (Cardiology)  Reason for Visit: Primary Care Follow-up     History of Present Illness & Review of Systems:     Matthew Daugherty is a 73 y.o. year old male primary care patient that presents today for a telehealth visit.  The patient is pleasant and alert and is at home.  He denies any chest pain pressure tightness or shortness of breath.  He denies any problems with his intestinal tract including nausea vomiting diarrhea blood in the stool black tarry bowel movements or change in bowel habits.  He does have a history of colon polyps and knows that he is due to get a repeat colonoscopy this August because of the colon polyps he has had previously.  He is passing  his water well.  We did review once again the pulmonary nodules he has had and after reviewing the CT scan once again with him they were all considered benign and no further CT scans were deemed necessary.  He continues to take his Integra because of his iron deficiency anemia.  He does not need any refills.  Review of systems as stated, otherwise negative.  The patient does not have symptoms concerning for COVID-19 infection (fever, chills, cough, or new shortness of breath).      Current Medications (Verified) Allergies as of 09/15/2018      Reactions   Niaspan [niacin Er] Other (See Comments)   Increases blood sugars      Medication List       Accurate as of Sep 15, 2018  7:48 AM. If you have any questions, ask your nurse or doctor.        aspirin 81 MG EC tablet Take 81 mg by mouth daily.   atorvastatin 80 MG tablet Commonly known as:  LIPITOR TAKE 1 TABLET EVERY DAY  AT  6PM   fenofibrate 160 MG tablet TAKE 1 TABLET EVERY DAY   Integra Plus Caps TAKE 1 CAPSULE EVERY DAY   levothyroxine 100 MCG tablet Commonly known as:  SYNTHROID Take 1 tablet (100 mcg total) by mouth daily before breakfast.   losartan-hydrochlorothiazide 100-25 MG tablet Commonly known as:  HYZAAR TAKE 1 TABLET EVERY DAY   metFORMIN  1000 MG tablet Commonly known as:  GLUCOPHAGE Take 1 tablet (1,000 mg total) by mouth 2 (two) times daily with a meal.   multivitamin with minerals Tabs tablet Take 1 tablet by mouth daily.   Vitamin D-3 125 MCG (5000 UT) Tabs Take 1 tablet by mouth daily.           Allergies (Verified)    Niaspan [niacin er]  Past Medical History Past Medical History:  Diagnosis Date   Cardiac dysrhythmia, unspecified    Diabetes mellitus without complication ([place])    Essential hypertension, benign    Hyperplasia of prostate    Other and unspecified hyperlipidemia    Thyroid disease    Unspecified transient cerebral ischemia      Past Surgical History:    Procedure Laterality Date   APPENDECTOMY     CHOLECYSTECTOMY     LAPAROSCOPIC CHOLECYSTECTOMY SINGLE SITE WITH INTRAOPERATIVE CHOLANGIOGRAM N/A 06/22/2015   Procedure: LAPAROSCOPIC CHOLECYSTECTOMY SINGLE SITE WITH INTRAOPERATIVE CHOLANGIOGRAM, PRIMARY UMBILICAL HERNIA REPAIR;  Surgeon: Michael Boston, MD;  Location: [facility];  Service: General;  Laterality: N/A;    Social History   Socioeconomic History   Marital status: Married    Spouse name: Matthew Daugherty   Number of children: 1   Years of education: Not on file   Highest education level: High school graduate  Occupational History   Occupation: retired    Fish farm manager: [employer] strain: Not hard at all   Food insecurity:    Worry: Never true    Inability: Never true   Transportation needs:    Medical: No    Non-medical: No  Tobacco Use   Smoking status: Former Smoker    Packs/day: 1.00    Years: 44.00    Pack years: 44.00    Types: Cigarettes    Start date: 04/20/1954    Last attempt to quit: 06/13/1998    Years since quitting: 20.2   Smokeless tobacco: Never Used  Substance and Sexual Activity   Alcohol use: No   Drug use: No   Sexual activity: Not on file  Lifestyle   Physical activity:    Days per week: 3 days    Minutes per session: 40 min   Stress: Not at all  Relationships   Social connections:    Talks on phone: More than three times a week    Gets together: More than three times a week    Attends religious service: More than 4 times per year    Active member of club or organization: No    Attends meetings of clubs or organizations: Never    Relationship status: Married  Other Topics Concern   Not on file  Social History Narrative   Patient lives with his wife in a 2 story home.  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  He has one son, and 2 grand daughters.     Family History  Problem Relation Age of Onset    Cancer Mother        bone   Asthma Father    Heart attack Father        2      Labs/Other Tests and Data Reviewed:    Wt Readings from Last 3 Encounters:  05/30/18 175 lb (79.4 kg)  05/17/18 174 lb (78.9 kg)  01/06/18 173 lb (78.5 kg)   Temp Readings from Last 3 Encounters:  05/17/18 (!) 96.7 F (35.9  C) (Oral)  01/06/18 (!) 97.2 F (36.2 C) (Oral)  09/02/17 (!) 96.9 F (36.1 C) (Oral)   BP Readings from Last 3 Encounters:  05/30/18 122/81  05/17/18 133/73  01/06/18 136/82   Pulse Readings from Last 3 Encounters:  05/30/18 94  05/17/18 74  01/06/18 90     Lab Results  Component Value Date   HGBA1C 6.8 09/08/2018   HGBA1C 6.9 02/17/2018   HGBA1C 7.1 (H) 12/30/2017   Lab Results  Component Value Date   MICROALBUR 20 08/28/2014   LDLCALC 52 09/08/2018   CREATININE 1.30 (H) 09/08/2018       Chemistry      Component Value Date/Time   NA 138 09/08/2018 1024   K 4.7 09/08/2018 1024   CL 101 09/08/2018 1024   CO2 22 09/08/2018 1024   BUN 14 09/08/2018 1024   CREATININE 1.30 (H) 09/08/2018 1024   CREATININE 1.20 11/03/2012 0815      Component Value Date/Time   CALCIUM 10.1 09/08/2018 1024   ALKPHOS 55 09/08/2018 1024   AST 32 09/08/2018 1024   ALT 34 09/08/2018 1024   BILITOT 0.4 09/08/2018 1024         OBSERVATIONS/ OBJECTIVE:     Patient is pleasant and alert.  He says his weight is about 174 pounds.  His blood pressures have been running between 1 19-1 24 over the 70s.  His fasting blood sugars have been around 118 and daytime checks are running in the 96 range.  He says his feet are normal and there is no redness or sores noted.  Physical exam deferred due to nature of telephonic visit.  ASSESSMENT & PLAN    Time:   Today, I have spent 23 minutes with the patient via telephone discussing the above including Covid precautions.     Visit Diagnoses: 1. Hypothyroidism, unspecified type -Continue with current treatment  2.  Hypertension -Blood pressures are good he should continue with his R HCT combination.  3. Benign prostatic hyperplasia, unspecified whether lower urinary tract symptoms present -No complaints with voiding.  4. Iron deficiency anemia secondary to inadequate dietary iron intake -Continue with Integra, hemoglobin minimally decreased but stable.  5. Type 2 diabetes mellitus without complication, without long-term current use of insulin ([facility]) -Hemoglobin A1c was good at 6.7% or thereabouts and he will continue with current treatment  6. Mixed hyperlipidemia -All cholesterol numbers were excellent other than the HDL being slightly decreased.  7. Vitamin D deficiency -Continue with vitamin D replacement  8. Aortic atherosclerosis ([place]) -Continue with aggressive therapeutic lifestyle changes and statin therapy  9. Abnormal chest CT -No further chest CTs are necessary.  He did have several chest CTs and the pulmonary nodules that were present were considered benign with no follow-up recommended  Patient Instructions  Please continue with aggressive therapeutic lifestyle changes Always eat healthy and get plenty of exercise and make all efforts to keep weight down through diet and exercise Check blood sugars regularly Get colonoscopies regularly Get periodic chest x-rays Always keep feet checked Keep eyes checked every year     The above assessment and management plan was discussed with the patient. The patient verbalized understanding of and has agreed to the management plan. Patient is aware to call the clinic if symptoms persist or worsen. Patient is aware when to return to the clinic for a follow-up visit. Patient educated on when it is appropriate to go to the emergency department.    Chipper Herb, MD  [facility] [address], [place], [place] [address] Ph [phone]   Arrie Senate MD

## 2018-10-07 ENCOUNTER — Other Ambulatory Visit: Payer: Self-pay | Admitting: Family Medicine

## 2018-10-14 ENCOUNTER — Other Ambulatory Visit: Payer: Self-pay | Admitting: Family Medicine

## 2018-10-18 ENCOUNTER — Telehealth: Payer: Self-pay | Admitting: Family Medicine

## 2018-11-11 ENCOUNTER — Other Ambulatory Visit: Payer: Self-pay | Admitting: Family Medicine

## 2018-11-18 ENCOUNTER — Other Ambulatory Visit: Payer: Self-pay | Admitting: Family Medicine

## 2019-01-16 ENCOUNTER — Other Ambulatory Visit: Payer: Self-pay

## 2019-01-17 ENCOUNTER — Encounter: Payer: Self-pay | Admitting: Family Medicine

## 2019-01-17 ENCOUNTER — Ambulatory Visit (INDEPENDENT_AMBULATORY_CARE_PROVIDER_SITE_OTHER): Payer: Medicare Other | Admitting: Family Medicine

## 2019-01-17 VITALS — BP 133/70 | HR 91 | Temp 97.7°F | Resp 20 | Ht 70.0 in | Wt 171.0 lb

## 2019-01-17 DIAGNOSIS — I10 Essential (primary) hypertension: Secondary | ICD-10-CM | POA: Diagnosis not present

## 2019-01-17 DIAGNOSIS — E119 Type 2 diabetes mellitus without complications: Secondary | ICD-10-CM

## 2019-01-17 DIAGNOSIS — E1159 Type 2 diabetes mellitus with other circulatory complications: Secondary | ICD-10-CM | POA: Diagnosis not present

## 2019-01-17 DIAGNOSIS — E785 Hyperlipidemia, unspecified: Secondary | ICD-10-CM | POA: Diagnosis not present

## 2019-01-17 DIAGNOSIS — E039 Hypothyroidism, unspecified: Secondary | ICD-10-CM | POA: Diagnosis not present

## 2019-01-17 DIAGNOSIS — E559 Vitamin D deficiency, unspecified: Secondary | ICD-10-CM

## 2019-01-17 DIAGNOSIS — E1169 Type 2 diabetes mellitus with other specified complication: Secondary | ICD-10-CM

## 2019-01-17 DIAGNOSIS — Z23 Encounter for immunization: Secondary | ICD-10-CM | POA: Diagnosis not present

## 2019-01-17 LAB — BAYER DCA HB A1C WAIVED: HB A1C (BAYER DCA - WAIVED): 6.9 % (ref <7.0)

## 2019-01-17 NOTE — Progress Notes (Signed)
Subjective:  Patient ID: Matthew Daugherty, male    DOB: 1945/06/21, 73 y.o.   MRN: [id number]  Patient Care Team: Baruch Gouty, FNP as PCP - General (Family Medicine) Clarene Essex, MD (Gastroenterology) Minus Breeding, MD as Consulting Physician (Cardiology)   Chief Complaint:  Medical Management of Chronic Issues (3-4 mo ), Hyperlipidemia, Hypertension, Diabetes, and Hypothyroidism   HPI: Matthew Daugherty is a 73 y.o. male presenting on 01/17/2019 for Medical Management of Chronic Issues (3-4 mo ), Hyperlipidemia, Hypertension, Diabetes, and Hypothyroidism   Matthew Daugherty is a 73 yo male who presents for a 3 month follow up for HTN, T2DM, and HLD.  He is doing well and has no complaints or concerns.   Hyperlipidemia This is a chronic problem. The current episode started more than 1 year ago. The problem is controlled. Exacerbating diseases include diabetes. Pertinent negatives include no chest pain, leg pain, myalgias or shortness of breath. Current antihyperlipidemic treatment includes diet change, exercise and statins. Risk factors for coronary artery disease include diabetes mellitus, hypertension and male sex.  Hypertension This is a chronic problem. The current episode started more than 1 year ago. The problem is unchanged. The problem is controlled. Pertinent negatives include no blurred vision, chest pain, headaches, malaise/fatigue, orthopnea, palpitations, peripheral edema, PND, shortness of breath or sweats. Risk factors for coronary artery disease include diabetes mellitus, dyslipidemia and male gender. Past treatments include lifestyle changes, angiotensin blockers and diuretics. Compliance problems include diet.  There is no history of kidney disease, heart failure, PVD or retinopathy.  Diabetes He presents for his follow-up diabetic visit. He has type 2 diabetes mellitus. His disease course has been stable. There are no hypoglycemic associated symptoms. Pertinent negatives for hypoglycemia  include no dizziness, headaches, nervousness/anxiousness or sweats. Pertinent negatives for diabetes include no blurred vision, no chest pain, no fatigue, no foot paresthesias, no foot ulcerations, no polydipsia, no polyphagia, no polyuria, no visual change, no weakness and no weight loss. There are no hypoglycemic complications. Pertinent negatives for diabetic complications include no PVD or retinopathy. Risk factors for coronary artery disease include diabetes mellitus, dyslipidemia and male sex. Current diabetic treatment includes diet and oral agent (monotherapy). He is compliant with treatment most of the time. His weight is stable. He is following a diabetic diet. His home blood glucose trend is fluctuating minimally. His breakfast blood glucose range is generally 90-110 mg/dl. An ACE inhibitor/angiotensin II receptor blocker is being taken. He does not see a podiatrist.Eye exam is current.    Relevant past medical, surgical, family, and social history reviewed and updated as indicated.  Allergies and medications reviewed and updated. Date reviewed: Chart in Epic.   Past Medical History:  Diagnosis Date  . Cardiac dysrhythmia, unspecified   . Diabetes mellitus without complication ([place])   . Essential hypertension, benign   . Hyperplasia of prostate   . Other and unspecified hyperlipidemia   . Thyroid disease   . Unspecified transient cerebral ischemia     Past Surgical History:  Procedure Laterality Date  . APPENDECTOMY    . CHOLECYSTECTOMY    . LAPAROSCOPIC CHOLECYSTECTOMY SINGLE SITE WITH INTRAOPERATIVE CHOLANGIOGRAM N/A 06/22/2015   Procedure: LAPAROSCOPIC CHOLECYSTECTOMY SINGLE SITE WITH INTRAOPERATIVE CHOLANGIOGRAM, PRIMARY UMBILICAL HERNIA REPAIR;  Surgeon: Michael Boston, MD;  Location: [facility];  Service: General;  Laterality: N/A;    Social History   Socioeconomic History  . Marital status: Married    Spouse name: [name]  . Number of children: 1  .  Years of education: Not on  file  . Highest education level: High school graduate  Occupational History  . Occupation: retired    Fish farm manager: [employer]  Social Needs  . Financial resource strain: Not hard at all  . Food insecurity    Worry: Never true    Inability: Never true  . Transportation needs    Medical: No    Non-medical: No  Tobacco Use  . Smoking status: Former Smoker    Packs/day: 1.00    Years: 44.00    Pack years: 44.00    Types: Cigarettes    Start date: 04/20/1954    Quit date: 06/13/1998    Years since quitting: 20.6  . Smokeless tobacco: Never Used  Substance and Sexual Activity  . Alcohol use: No  . Drug use: No  . Sexual activity: Not on file  Lifestyle  . Physical activity    Days per week: 3 days    Minutes per session: 40 min  . Stress: Not at all  Relationships  . Social connections    Talks on phone: More than three times a week    Gets together: More than three times a week    Attends religious service: More than 4 times per year    Active member of club or organization: No    Attends meetings of clubs or organizations: Never    Relationship status: Married  . Intimate partner violence    Fear of current or ex partner: No    Emotionally abused: No    Physically abused: No    Forced sexual activity: No  Other Topics Concern  . Not on file  Social History Narrative   Patient lives with his wife in a 2 story home.  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  He has one son, and 2 grand daughters.    Outpatient Encounter Medications as of 01/17/2019  Medication Sig  . aspirin 81 MG EC tablet Take 81 mg by mouth daily.    Marland Kitchen atorvastatin (LIPITOR) 80 MG tablet TAKE 1 TABLET EVERY DAY  AT  6PM  . Cholecalciferol (VITAMIN D-3) 5000 UNITS TABS Take 1 tablet by mouth daily.    Marland Kitchen FeFum-FePoly-FA-B Cmp-C-Biot (INTEGRA PLUS) CAPS TAKE 1 CAPSULE EVERY DAY  . fenofibrate 160 MG tablet TAKE 1 TABLET EVERY DAY  . levothyroxine (SYNTHROID) 100  MCG tablet TAKE 1 TABLET (100 MCG TOTAL) BY MOUTH DAILY BEFORE BREAKFAST.  Marland Kitchen losartan-hydrochlorothiazide (HYZAAR) 100-25 MG tablet TAKE 1 TABLET EVERY DAY  . metFORMIN (GLUCOPHAGE) 1000 MG tablet TAKE 1 TABLET 2 TIMES DAILY WITH A MEAL.  . Multiple Vitamin (MULTIVITAMIN WITH MINERALS) TABS tablet Take 1 tablet by mouth daily.   No facility-administered encounter medications on file as of 01/17/2019.     Allergies  Allergen Reactions  . Niaspan [Niacin Er] Other (See Comments)    Increases blood sugars     Review of Systems  Constitutional: Negative for activity change, appetite change, fatigue, fever, malaise/fatigue, unexpected weight change and weight loss.  HENT: Negative for ear pain.   Eyes: Negative for blurred vision and visual disturbance.  Respiratory: Negative for cough and shortness of breath.   Cardiovascular: Negative for chest pain, palpitations, orthopnea, leg swelling and PND.  Gastrointestinal: Negative for abdominal pain, constipation, diarrhea, nausea and vomiting.  Endocrine: Negative for polydipsia, polyphagia and polyuria.  Genitourinary: Negative for decreased urine volume, difficulty urinating and dysuria.  Musculoskeletal: Negative for arthralgias and myalgias.  Skin: Negative for color change, rash and wound.  Neurological: Negative for dizziness, syncope, weakness and headaches.  Psychiatric/Behavioral: Negative for dysphoric mood. The patient is not nervous/anxious.         Objective:  BP 133/70   Pulse 91   Temp 97.7 F (36.5 C)   Resp 20   Ht _0  (1.778 m)   Wt 171 lb (77.6 kg)   SpO2 99%   BMI 24.54 kg/m    Wt Readings from Last 3 Encounters:  01/17/19 171 lb (77.6 kg)  05/30/18 175 lb (79.4 kg)  05/17/18 174 lb (78.9 kg)    Physical Exam Vitals signs reviewed. Exam conducted with a chaperone present.  Constitutional:      General: He is not in acute distress.    Appearance: Normal appearance. He is normal weight. He is not  ill-appearing, toxic-appearing or diaphoretic.  HENT:     Head: Normocephalic and atraumatic.     Right Ear: Tympanic membrane and external ear normal.     Left Ear: Tympanic membrane and external ear normal.     Nose: Nose normal.     Mouth/Throat:     Mouth: Mucous membranes are moist.     Pharynx: Oropharynx is clear.  Eyes:     Extraocular Movements: Extraocular movements intact.     Pupils: Pupils are equal, round, and reactive to light.  Neck:     Musculoskeletal: Normal range of motion and neck supple. No muscular tenderness.  Cardiovascular:     Rate and Rhythm: Normal rate and regular rhythm.     Heart sounds: Normal heart sounds. No murmur.  Pulmonary:     Effort: Pulmonary effort is normal. No respiratory distress.     Breath sounds: Normal breath sounds.  Abdominal:     General: Bowel sounds are normal. There is no distension.     Palpations: Abdomen is soft.     Tenderness: There is no abdominal tenderness.  Musculoskeletal: Normal range of motion.        General: No swelling.  Lymphadenopathy:     Cervical: No cervical adenopathy.  Skin:    General: Skin is warm and dry.  Neurological:     Mental Status: He is alert and oriented to person, place, and time. Mental status is at baseline.  Psychiatric:        Mood and Affect: Mood normal.        Behavior: Behavior normal.     Results for orders placed or performed in visit on 09/08/18  [facility]  Result Value Ref Range   Glucose 149 (H) 65 - 99 mg/dL   BUN 14 8 - 27 mg/dL   Creatinine, Ser 1.30 (H) 0.76 - 1.27 mg/dL   GFR calc non Af Amer 55 (L) >59 mL/min/1.73   GFR calc Af Amer 64 >59 mL/min/1.73   BUN/Creatinine Ratio 11 10 - 24   Sodium 138 134 - 144 mmol/L   Potassium 4.7 3.5 - 5.2 mmol/L   Chloride 101 96 - 106 mmol/L   CO2 22 20 - 29 mmol/L   Calcium 10.1 8.6 - 10.2 mg/dL  CBC with Differential/Platelet  Result Value Ref Range   WBC 6.9 3.4 - 10.8 x10E3/uL   RBC 4.31 4.14 - 5.80 x10E6/uL    Hemoglobin 12.8 (L) 13.0 - 17.7 g/dL   Hematocrit 38.5 37.5 - 51.0 %   MCV 89 79 - 97 fL   MCH 29.7 26.6 - 33.0 pg   MCHC 33.2 31.5 -  35.7 g/dL   RDW 12.7 11.6 - 15.4 %   Platelets 354 150 - 450 x10E3/uL   Neutrophils 59 Not Estab. %   Lymphs 33 Not Estab. %   Monocytes 7 Not Estab. %   Eos 1 Not Estab. %   Basos 0 Not Estab. %   Neutrophils Absolute 4.1 1.4 - 7.0 x10E3/uL   Lymphocytes Absolute 2.3 0.7 - 3.1 x10E3/uL   Monocytes Absolute 0.5 0.1 - 0.9 x10E3/uL   EOS (ABSOLUTE) 0.1 0.0 - 0.4 x10E3/uL   Basophils Absolute 0.0 0.0 - 0.2 x10E3/uL   Immature Granulocytes 0 Not Estab. %   Immature Grans (Abs) 0.0 0.0 - 0.1 x10E3/uL  VITAMIN D 25 Hydroxy (Vit-D Deficiency, Fractures)  Result Value Ref Range   Vit D, 25-Hydroxy 52.8 30.0 - 100.0 ng/mL  Hepatic function panel  Result Value Ref Range   Total Protein 6.7 6.0 - 8.5 g/dL   Albumin 4.7 3.8 - 4.8 g/dL   Bilirubin Total 0.4 0.0 - 1.2 mg/dL   Bilirubin, Direct 0.21 0.00 - 0.40 mg/dL   Alkaline Phosphatase 55 39 - 117 IU/L   AST 32 0 - 40 IU/L   ALT 34 0 - 44 IU/L  Bayer DCA Hb A1c Waived  Result Value Ref Range   HB A1C (BAYER DCA - WAIVED) 6.8 <7.0 %  Lipid panel  Result Value Ref Range   Cholesterol, Total 106 100 - 199 mg/dL   Triglycerides 91 0 - 149 mg/dL   HDL 36 (L) >39 mg/dL   VLDL Cholesterol Cal 18 5 - 40 mg/dL   LDL Calculated 52 0 - 99 mg/dL   Chol/HDL Ratio 2.9 0.0 - 5.0 ratio  Thyroid Panel With TSH  Result Value Ref Range   TSH 2.680 0.450 - 4.500 uIU/mL   T4, Total 11.4 4.5 - 12.0 ug/dL   T3 Uptake Ratio 27 24 - 39 %   Free Thyroxine Index 3.1 1.2 - 4.9       Pertinent labs & imaging results that were available during my care of the patient were reviewed by me and considered in my medical decision making.  Assessment & Plan:  Nolyn was seen today for medical management of chronic issues, hyperlipidemia, hypertension, diabetes and hypothyroidism.  Diagnoses and all orders for this visit:  Type 2  diabetes mellitus without complication, without long-term current use of insulin (HCC) A1C 6.9 today.  Diabetes Control: controlled Instruction/counseling given: reminded to get eye exam, reminded to bring blood glucose meter & log to each visit, discussed foot care, discussed diet and provided printed educational material 1.  Rx changes: none 2.  Education: Reviewed 'ABCs' of diabetes management (respective goals in parentheses):  A1C (<7), blood pressure (<130/80), BMI (<25), and cholesterol (LDL <100). 3.  Discussed pathophysiology of DM; difference between type 1 and type 2 DM. 4.  CHO counting diet discussed.  Reviewed CHO amount in various foods and how to read nutrition labels.  Discussed recommended serving sizes.  5.  Recommend check BG several times a week and record readings to bring to next appointment. Report persistent high or low readings. 6.  Recommended increase physical activity - goal is 150 minutes per week and advance as tolerated. 7.  Adequate sleep, at least 6-8 hours per night.  8.  Smoking cessation.  9.  Follow up: 4 months -     CBC with Differential/Platelet -     Bayer DCA Hb A1c Waived  Acquired hypothyroidism Thyroid disease has been  controlled. Labs are pending. Adjustments to regimen will be made if warranted. Make sure to take medications on an empty stomach with a full glass of water. Make sure to avoid vitamins or supplements for at least 4 hours before and 4 hours after taking medications. Repeat labs in 3 months if adjustments are made and in 6 months if stable.   -     CBC with Differential/Platelet -     Thyroid Panel With TSH  Hypertension associated with type 2 diabetes mellitus (HCC) BP well controlled. Changes were not made in regimen. Daily blood pressure log given with instructions on how to fill out and told to bring to next visit. Goal BP 130/80. Pt aware to report any persistent high or low readings. DASH diet and exercise encouraged. Exercise at  least 150 minutes per week and increase as tolerated. Goal BMI > 25. Stress management encouraged. Avoid excessive alcohol. Avoid NSAID's. Avoid more than 2000 mg of sodium daily. Medications as prescribed. Follow up as scheduled.  -     CBC with Differential/Platelet -     CMP14+EGFR  Hyperlipidemia associated with type 2 diabetes mellitus ([place]) Diet encouraged - increase intake of fresh fruits and vegetables, increase intake of lean proteins. Bake, broil, or grill foods. Avoid fried, greasy, and fatty foods. Avoid fast foods. Increase intake of fiber-rich whole grains. Exercise encouraged - at least 150 minutes per week and advance as tolerated.  Goal BMI < 25. Continue medications as prescribed. Follow up in 3-6 months as discussed.  -     CBC with Differential/Platelet -     Lipid panel  Vitamin D deficiency Labs pending. Continue repletion therapy. If indicated, will change repletion dosage. Eat foods rich in Vit D including milk, orange juice, yogurt with vitamin D added, salmon or mackerel, canned tuna fish, cereals with vitamin D added, and cod liver oil. Get out in the sun but make sure to wear at least SPF 30 sunscreen.  -     CBC with Differential/Platelet -     VITAMIN D 25 Hydroxy (Vit-D Deficiency, Fractures)   Influenza and Tdap updated today   Continue all other maintenance medications.  Follow up plan: Return in about 4 months (around 05/19/2019), or if symptoms worsen or fail to improve, for DM. May come for lab work 1 week prior to appointment if patient desires.   Continue healthy lifestyle choices, including diet (rich in fruits, vegetables, and lean proteins, and low in salt and simple carbohydrates) and exercise (at least 30 minutes of moderate physical activity daily).  Educational handout given for health maintenance and T2DM.  The above assessment and management plan was discussed with the patient. The patient verbalized understanding of and has agreed to the  management plan. Patient is aware to call the clinic if they develop any new symptoms or if symptoms persist or worsen. Patient is aware when to return to the clinic for a follow-up visit. Patient educated on when it is appropriate to go to the emergency department.   Marjorie Smolder, RN, FNP student [facility] [phone]   I personally was present during the history, physical exam, and medical decision-making activities of this service and have verified that the service and findings are accurately documented in the nurse practitioner student's note.  Monia Pouch, FNP-C [facility] [address] [place], [place] [address] [phone]

## 2019-01-17 NOTE — Patient Instructions (Signed)

## 2019-01-17 NOTE — Addendum Note (Signed)
Addended by: Zannie Cove on: 01/17/2019 09:38 AM   Modules accepted: Orders

## 2019-01-18 LAB — LIPID PANEL
Chol/HDL Ratio: 3 ratio (ref 0.0–5.0)
Cholesterol, Total: 123 mg/dL (ref 100–199)
HDL: 41 mg/dL
LDL Chol Calc (NIH): 63 mg/dL (ref 0–99)
Triglycerides: 101 mg/dL (ref 0–149)
VLDL Cholesterol Cal: 19 mg/dL (ref 5–40)

## 2019-01-18 LAB — CMP14+EGFR
ALT: 32 IU/L (ref 0–44)
AST: 29 IU/L (ref 0–40)
Albumin/Globulin Ratio: 2.7 — ABNORMAL HIGH (ref 1.2–2.2)
Albumin: 5.1 g/dL — ABNORMAL HIGH (ref 3.8–4.8)
Alkaline Phosphatase: 65 IU/L (ref 39–117)
BUN/Creatinine Ratio: 14 (ref 10–24)
BUN: 18 mg/dL (ref 8–27)
Bilirubin Total: 0.6 mg/dL (ref 0.0–1.2)
CO2: 22 mmol/L (ref 20–29)
Calcium: 10.1 mg/dL (ref 8.6–10.2)
Chloride: 98 mmol/L (ref 96–106)
Creatinine, Ser: 1.29 mg/dL — ABNORMAL HIGH (ref 0.76–1.27)
GFR calc Af Amer: 65 mL/min/{1.73_m2} (ref >59)
GFR calc non Af Amer: 56 mL/min/{1.73_m2} — ABNORMAL LOW (ref >59)
Globulin, Total: 1.9 g/dL (ref 1.5–4.5)
Glucose: 151 mg/dL — ABNORMAL HIGH (ref 65–99)
Potassium: 4.9 mmol/L (ref 3.5–5.2)
Sodium: 137 mmol/L (ref 134–144)
Total Protein: 7 g/dL (ref 6.0–8.5)

## 2019-01-18 LAB — CBC WITH DIFFERENTIAL/PLATELET
Basophils Absolute: 0.1 10*3/uL (ref 0.0–0.2)
Basos: 1 %
EOS (ABSOLUTE): 0.1 10*3/uL (ref 0.0–0.4)
Eos: 1 %
Hematocrit: 39.4 % (ref 37.5–51.0)
Hemoglobin: 13.1 g/dL (ref 13.0–17.7)
Immature Grans (Abs): 0 10*3/uL (ref 0.0–0.1)
Immature Granulocytes: 0 %
Lymphocytes Absolute: 2.4 10*3/uL (ref 0.7–3.1)
Lymphs: 32 %
MCH: 30.3 pg (ref 26.6–33.0)
MCHC: 33.2 g/dL (ref 31.5–35.7)
MCV: 91 fL (ref 79–97)
Monocytes Absolute: 0.5 10*3/uL (ref 0.1–0.9)
Monocytes: 7 %
Neutrophils Absolute: 4.4 10*3/uL (ref 1.4–7.0)
Neutrophils: 59 %
Platelets: 342 10*3/uL (ref 150–450)
RBC: 4.32 x10E6/uL (ref 4.14–5.80)
RDW: 12.2 % (ref 11.6–15.4)
WBC: 7.4 10*3/uL (ref 3.4–10.8)

## 2019-01-18 LAB — THYROID PANEL WITH TSH
Free Thyroxine Index: 3.1 (ref 1.2–4.9)
T3 Uptake Ratio: 28 % (ref 24–39)
T4, Total: 10.9 ug/dL (ref 4.5–12.0)
TSH: 2.28 u[IU]/mL (ref 0.450–4.500)

## 2019-01-18 LAB — VITAMIN D 25 HYDROXY (VIT D DEFICIENCY, FRACTURES): Vit D, 25-Hydroxy: 44.5 ng/mL (ref 30.0–100.0)

## 2019-01-20 ENCOUNTER — Encounter: Payer: Self-pay | Admitting: *Deleted

## 2019-02-02 ENCOUNTER — Other Ambulatory Visit: Payer: Self-pay | Admitting: Family Medicine

## 2019-02-17 DIAGNOSIS — Z1159 Encounter for screening for other viral diseases: Secondary | ICD-10-CM | POA: Diagnosis not present

## 2019-02-22 DIAGNOSIS — K5730 Diverticulosis of large intestine without perforation or abscess without bleeding: Secondary | ICD-10-CM | POA: Diagnosis not present

## 2019-02-22 DIAGNOSIS — D123 Benign neoplasm of transverse colon: Secondary | ICD-10-CM | POA: Diagnosis not present

## 2019-02-22 DIAGNOSIS — Z86010 Personal history of colonic polyps: Secondary | ICD-10-CM | POA: Diagnosis not present

## 2019-02-24 DIAGNOSIS — D123 Benign neoplasm of transverse colon: Secondary | ICD-10-CM | POA: Diagnosis not present

## 2019-03-09 ENCOUNTER — Other Ambulatory Visit: Payer: Self-pay | Admitting: Family Medicine

## 2019-04-08 ENCOUNTER — Other Ambulatory Visit: Payer: Self-pay | Admitting: Family Medicine

## 2019-04-12 ENCOUNTER — Other Ambulatory Visit: Payer: Self-pay | Admitting: Physician Assistant

## 2019-04-17 ENCOUNTER — Other Ambulatory Visit: Payer: Self-pay

## 2019-04-17 ENCOUNTER — Other Ambulatory Visit: Payer: Medicare Other

## 2019-04-17 DIAGNOSIS — E1159 Type 2 diabetes mellitus with other circulatory complications: Secondary | ICD-10-CM

## 2019-04-17 DIAGNOSIS — E119 Type 2 diabetes mellitus without complications: Secondary | ICD-10-CM | POA: Diagnosis not present

## 2019-04-17 DIAGNOSIS — E559 Vitamin D deficiency, unspecified: Secondary | ICD-10-CM

## 2019-04-17 DIAGNOSIS — I10 Essential (primary) hypertension: Secondary | ICD-10-CM | POA: Diagnosis not present

## 2019-04-17 DIAGNOSIS — E039 Hypothyroidism, unspecified: Secondary | ICD-10-CM | POA: Diagnosis not present

## 2019-04-17 DIAGNOSIS — E1169 Type 2 diabetes mellitus with other specified complication: Secondary | ICD-10-CM | POA: Diagnosis not present

## 2019-04-17 DIAGNOSIS — E785 Hyperlipidemia, unspecified: Secondary | ICD-10-CM | POA: Diagnosis not present

## 2019-04-17 LAB — BAYER DCA HB A1C WAIVED: HB A1C (BAYER DCA - WAIVED): 6.6 % (ref <7.0)

## 2019-04-17 NOTE — Progress Notes (Signed)
A1C looks good at 6.6. continue current medications.

## 2019-04-18 LAB — CMP14+EGFR
ALT: 27 IU/L (ref 0–44)
AST: 31 IU/L (ref 0–40)
Albumin/Globulin Ratio: 2.8 — ABNORMAL HIGH (ref 1.2–2.2)
Albumin: 4.7 g/dL (ref 3.7–4.7)
Alkaline Phosphatase: 57 IU/L (ref 39–117)
BUN/Creatinine Ratio: 12 (ref 10–24)
BUN: 14 mg/dL (ref 8–27)
Bilirubin Total: 0.6 mg/dL (ref 0.0–1.2)
CO2: 23 mmol/L (ref 20–29)
Calcium: 10.1 mg/dL (ref 8.6–10.2)
Chloride: 101 mmol/L (ref 96–106)
Creatinine, Ser: 1.21 mg/dL (ref 0.76–1.27)
GFR calc Af Amer: 69 mL/min/{1.73_m2} (ref >59)
GFR calc non Af Amer: 60 mL/min/{1.73_m2} (ref >59)
Globulin, Total: 1.7 g/dL (ref 1.5–4.5)
Glucose: 144 mg/dL — ABNORMAL HIGH (ref 65–99)
Potassium: 4.7 mmol/L (ref 3.5–5.2)
Sodium: 138 mmol/L (ref 134–144)
Total Protein: 6.4 g/dL (ref 6.0–8.5)

## 2019-04-18 LAB — CBC WITH DIFFERENTIAL/PLATELET
Basophils Absolute: 0.1 10*3/uL (ref 0.0–0.2)
Basos: 1 %
EOS (ABSOLUTE): 0.1 10*3/uL (ref 0.0–0.4)
Eos: 1 %
Hematocrit: 39.5 % (ref 37.5–51.0)
Hemoglobin: 12.8 g/dL — ABNORMAL LOW (ref 13.0–17.7)
Immature Grans (Abs): 0 10*3/uL (ref 0.0–0.1)
Immature Granulocytes: 1 %
Lymphocytes Absolute: 2.2 10*3/uL (ref 0.7–3.1)
Lymphs: 36 %
MCH: 30 pg (ref 26.6–33.0)
MCHC: 32.4 g/dL (ref 31.5–35.7)
MCV: 93 fL (ref 79–97)
Monocytes Absolute: 0.5 10*3/uL (ref 0.1–0.9)
Monocytes: 8 %
Neutrophils Absolute: 3.3 10*3/uL (ref 1.4–7.0)
Neutrophils: 53 %
Platelets: 322 10*3/uL (ref 150–450)
RBC: 4.26 x10E6/uL (ref 4.14–5.80)
RDW: 12.1 % (ref 11.6–15.4)
WBC: 6.2 10*3/uL (ref 3.4–10.8)

## 2019-04-18 LAB — LIPID PANEL
Chol/HDL Ratio: 2.7 ratio (ref 0.0–5.0)
Cholesterol, Total: 107 mg/dL (ref 100–199)
HDL: 39 mg/dL — ABNORMAL LOW (ref >39)
LDL Chol Calc (NIH): 48 mg/dL (ref 0–99)
Triglycerides: 110 mg/dL (ref 0–149)
VLDL Cholesterol Cal: 20 mg/dL (ref 5–40)

## 2019-04-18 LAB — VITAMIN D 25 HYDROXY (VIT D DEFICIENCY, FRACTURES): Vit D, 25-Hydroxy: 44.4 ng/mL (ref 30.0–100.0)

## 2019-04-25 ENCOUNTER — Other Ambulatory Visit: Payer: Self-pay

## 2019-04-26 ENCOUNTER — Ambulatory Visit (INDEPENDENT_AMBULATORY_CARE_PROVIDER_SITE_OTHER): Payer: Medicare Other | Admitting: Family Medicine

## 2019-04-26 ENCOUNTER — Encounter: Payer: Self-pay | Admitting: Family Medicine

## 2019-04-26 VITALS — BP 131/68 | HR 75 | Temp 97.5°F | Resp 20 | Ht 70.0 in | Wt 165.0 lb

## 2019-04-26 DIAGNOSIS — E785 Hyperlipidemia, unspecified: Secondary | ICD-10-CM | POA: Diagnosis not present

## 2019-04-26 DIAGNOSIS — D508 Other iron deficiency anemias: Secondary | ICD-10-CM | POA: Diagnosis not present

## 2019-04-26 DIAGNOSIS — I152 Hypertension secondary to endocrine disorders: Secondary | ICD-10-CM

## 2019-04-26 DIAGNOSIS — E119 Type 2 diabetes mellitus without complications: Secondary | ICD-10-CM | POA: Diagnosis not present

## 2019-04-26 DIAGNOSIS — E1159 Type 2 diabetes mellitus with other circulatory complications: Secondary | ICD-10-CM

## 2019-04-26 DIAGNOSIS — I700 Atherosclerosis of aorta: Secondary | ICD-10-CM | POA: Diagnosis not present

## 2019-04-26 DIAGNOSIS — E1169 Type 2 diabetes mellitus with other specified complication: Secondary | ICD-10-CM | POA: Diagnosis not present

## 2019-04-26 DIAGNOSIS — I10 Essential (primary) hypertension: Secondary | ICD-10-CM | POA: Diagnosis not present

## 2019-04-26 MED ORDER — INTEGRA PLUS PO CAPS: 1.0000 | ORAL_CAPSULE | Freq: Every day | ORAL | 4 refills | Status: DC

## 2019-04-26 NOTE — Patient Instructions (Signed)

## 2019-04-26 NOTE — Progress Notes (Signed)
Subjective:  Patient ID: Matthew Daugherty, male    DOB: Nov 01, 1945, 74 y.o.   MRN: [id number]  Patient Care Team: Baruch Gouty, FNP as PCP - General (Family Medicine) Clarene Essex, MD (Gastroenterology) Minus Breeding, MD as Consulting Physician (Cardiology)   Chief Complaint:  Medical Management of Chronic Issues (3 mo ), Diabetes, Hypothyroidism, and Hyperlipidemia   HPI: Matthew Daugherty is a 74 y.o. male presenting on 04/26/2019 for Medical Management of Chronic Issues (3 mo ), Diabetes, Hypothyroidism, and Hyperlipidemia   1. Type 2 diabetes mellitus without complication, without long-term current use of insulin (HCC) Pt presents for follow up evaluation of Type 2 diabetes mellitus.  Current symptoms include none. Patient denies foot ulcerations, hyperglycemia, hypoglycemia , increased appetite, nausea, paresthesia of the feet, polydipsia, polyuria, visual disturbances, vomiting and weight loss.  Current diabetic medications include metformin Compliant with meds - Yes  Current monitoring regimen: home blood tests - several times weekly Home blood sugar records: trend: stable Any episodes of hypoglycemia? no  Known diabetic complications: cardiovascular disease Cardiovascular risk factors: advanced age (older than 39 for men, 89 for women), diabetes mellitus, dyslipidemia, hypertension and male gender Eye exam current (within one year): yes Podiatry yearly?  No Weight trend: stable Current diet: in general, a "healthy" diet   Current exercise: walking  PNA Vaccine UTD?  Yes Hep B Vaccine?  Yes Tdap Vaccine UTD?  Yes Urine microalbumin UTD? Yes  Is He on ACE inhibitor or angiotensin II receptor blocker?  Yes, losartan Is He on statin? Yes atorvastatin Is He on ASA 81 mg daily?  Yes    2. Hypertension associated with type 2 diabetes mellitus ([place]) Complaint with meds - Yes Current Medications - losartan/hctz Checking BP at home - No Exercising Regularly - Yes Watching  Salt intake - Yes Pertinent ROS:  Headache - No Fatigue - No Visual Disturbances - No Chest pain - No Dyspnea - No Palpitations - No LE edema - No They report good compliance with medications and can restate their regimen by memory. No medication side effects.  Family, social, and smoking history reviewed.   BP Readings from Last 3 Encounters:  04/26/19 131/68  01/17/19 133/70  05/30/18 122/81   CMP Latest Ref Rng & Units 04/17/2019 01/17/2019 09/08/2018  Glucose 65 - 99 mg/dL 144(H) 151(H) 149(H)  BUN 8 - 27 mg/dL '14 18 14  ' Creatinine 0.76 - 1.27 mg/dL 1.21 1.29(H) 1.30(H)  Sodium 134 - 144 mmol/L 138 137 138  Potassium 3.5 - 5.2 mmol/L 4.7 4.9 4.7  Chloride 96 - 106 mmol/L 101 98 101  CO2 20 - 29 mmol/L '23 22 22  ' Calcium 8.6 - 10.2 mg/dL 10.1 10.1 10.1  Total Protein 6.0 - 8.5 g/dL 6.4 7.0 6.7  Total Bilirubin 0.0 - 1.2 mg/dL 0.6 0.6 0.4  Alkaline Phos 39 - 117 IU/L 57 65 55  AST 0 - 40 IU/L 31 29 32  ALT 0 - 44 IU/L 27 32 34      3. Hyperlipidemia associated with type 2 diabetes mellitus ([place]) Compliant with medications - Yes Current medications - atorvastatin, fenofibrate Side effects from medications - No Diet - generally healthy  Exercise - active daily  Lab Results  Component Value Date   CHOL 107 04/17/2019   HDL 39 (L) 04/17/2019   LDLCALC 48 04/17/2019   TRIG 110 04/17/2019   CHOLHDL 2.7 04/17/2019     Family and personal medical history reviewed. Smoking and ETOH  history reviewed.    4. Iron deficiency anemia secondary to inadequate dietary iron intake Currently on Integra plus and tolerating well. No constipation. No abnormal bleeding or bruising. No fatigue, weakness, dizziness, palpitations, or syncope.   5. Aortic atherosclerosis ([place]) Does watch diet and stays active. Compliant with ASA and statin therapy     Relevant past medical, surgical, family, and social history reviewed and updated as indicated.  Allergies and medications reviewed  and updated. Date reviewed: Chart in Epic.   Past Medical History:  Diagnosis Date  . Cardiac dysrhythmia, unspecified   . Diabetes mellitus without complication ([place])   . Essential hypertension, benign   . Hyperplasia of prostate   . Other and unspecified hyperlipidemia   . Thyroid disease   . Unspecified transient cerebral ischemia     Past Surgical History:  Procedure Laterality Date  . APPENDECTOMY    . CHOLECYSTECTOMY    . LAPAROSCOPIC CHOLECYSTECTOMY SINGLE SITE WITH INTRAOPERATIVE CHOLANGIOGRAM N/A 06/22/2015   Procedure: LAPAROSCOPIC CHOLECYSTECTOMY SINGLE SITE WITH INTRAOPERATIVE CHOLANGIOGRAM, PRIMARY UMBILICAL HERNIA REPAIR;  Surgeon: Michael Boston, MD;  Location: [facility];  Service: General;  Laterality: N/A;    Social History   Socioeconomic History  . Marital status: Married    Spouse name: [name]  . Number of children: 1  . Years of education: Not on file  . Highest education level: High school graduate  Occupational History  . Occupation: retired    Fish farm manager: [employer]  Tobacco Use  . Smoking status: Former Smoker    Packs/day: 1.00    Years: 44.00    Pack years: 44.00    Types: Cigarettes    Start date: 04/20/1954    Quit date: 06/13/1998    Years since quitting: 20.8  . Smokeless tobacco: Never Used  Substance and Sexual Activity  . Alcohol use: No  . Drug use: No  . Sexual activity: Not on file  Other Topics Concern  . Not on file  Social History Narrative   Patient lives with his wife in a 2 story home.  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  He has one son, and 2 grand daughters.   Social Determinants of Health   Financial Resource Strain: Low Risk   . Difficulty of Paying Living Expenses: Not hard at all  Food Insecurity: No Food Insecurity  . Worried About Charity fundraiser in the Last Year: Never true  . Ran Out of Food in the Last Year: Never true  Transportation Needs: No Transportation  Needs  . Lack of Transportation (Medical): No  . Lack of Transportation (Non-Medical): No  Physical Activity: Insufficiently Active  . Days of Exercise per Week: 3 days  . Minutes of Exercise per Session: 40 min  Stress: No Stress Concern Present  . Feeling of Stress : Not at all  Social Connections: Slightly Isolated  . Frequency of Communication with Friends and Family: More than three times a week  . Frequency of Social Gatherings with Friends and Family: More than three times a week  . Attends Religious Services: More than 4 times per year  . Active Member of Clubs or Organizations: No  . Attends Archivist Meetings: Never  . Marital Status: Married  Human resources officer Violence: Not At Risk  . Fear of Current or Ex-Partner: No  . Emotionally Abused: No  . Physically Abused: No  . Sexually Abused: No    Outpatient Encounter Medications as of  04/26/2019  Medication Sig  . aspirin 81 MG EC tablet Take 81 mg by mouth daily.    Marland Kitchen atorvastatin (LIPITOR) 80 MG tablet TAKE 1 TABLET EVERY DAY AT 6:00PM  . Cholecalciferol (VITAMIN D-3) 5000 UNITS TABS Take 1 tablet by mouth daily.    Marland Kitchen FeFum-FePoly-FA-B Cmp-C-Biot (INTEGRA PLUS) CAPS Take 1 capsule by mouth daily.  . fenofibrate 160 MG tablet TAKE 1 TABLET EVERY DAY  . levothyroxine (SYNTHROID) 100 MCG tablet TAKE 1 TABLET (100 MCG TOTAL) BY MOUTH DAILY BEFORE BREAKFAST.  Marland Kitchen losartan-hydrochlorothiazide (HYZAAR) 100-25 MG tablet TAKE 1 TABLET EVERY DAY  . metFORMIN (GLUCOPHAGE) 1000 MG tablet TAKE 1 TABLET 2 TIMES DAILY WITH A MEAL.  . Multiple Vitamin (MULTIVITAMIN WITH MINERALS) TABS tablet Take 1 tablet by mouth daily.  . [DISCONTINUED] FeFum-FePoly-FA-B Cmp-C-Biot (INTEGRA PLUS) CAPS TAKE 1 CAPSULE EVERY DAY   No facility-administered encounter medications on file as of 04/26/2019.    Allergies  Allergen Reactions  . Niaspan [Niacin Er] Other (See Comments)    Increases blood sugars     Review of Systems    Constitutional: Negative for activity change, appetite change, chills, diaphoresis, fatigue, fever and unexpected weight change.  HENT: Negative.   Eyes: Negative.  Negative for photophobia and visual disturbance.  Respiratory: Negative for cough, chest tightness and shortness of breath.   Cardiovascular: Negative for chest pain, palpitations and leg swelling.  Gastrointestinal: Negative for abdominal pain, blood in stool, constipation, diarrhea, nausea and vomiting.  Endocrine: Negative.  Negative for cold intolerance, heat intolerance, polydipsia, polyphagia and polyuria.  Genitourinary: Negative for decreased urine volume, difficulty urinating, dysuria, frequency and urgency.  Musculoskeletal: Negative for arthralgias and myalgias.  Skin: Negative.   Allergic/Immunologic: Negative.   Neurological: Negative for dizziness, tremors, seizures, syncope, facial asymmetry, speech difficulty, weakness, light-headedness, numbness and headaches.  Hematological: Negative.  Does not bruise/bleed easily.  Psychiatric/Behavioral: Negative for confusion, hallucinations, sleep disturbance and suicidal ideas.  All other systems reviewed and are negative.       Objective:  BP 131/68   Pulse 75   Temp (!) 97.5 F (36.4 C)   Resp 20   Ht '5\' 10"'  (1.778 m)   Wt 165 lb (74.8 kg)   SpO2 100%   BMI 23.68 kg/m    Wt Readings from Last 3 Encounters:  04/26/19 165 lb (74.8 kg)  01/17/19 171 lb (77.6 kg)  05/30/18 175 lb (79.4 kg)    Physical Exam Vitals and nursing note reviewed.  Constitutional:      General: He is not in acute distress.    Appearance: Normal appearance. He is well-developed, well-groomed and normal weight. He is not ill-appearing, toxic-appearing or diaphoretic.  HENT:     Head: Normocephalic and atraumatic.     Jaw: There is normal jaw occlusion.     Right Ear: Hearing, tympanic membrane, ear canal and external ear normal.     Left Ear: Hearing, tympanic membrane, ear canal  and external ear normal.     Nose: Nose normal.     Mouth/Throat:     Lips: Pink.     Mouth: Mucous membranes are moist.     Pharynx: Oropharynx is clear. Uvula midline.  Eyes:     General: Lids are normal.     Extraocular Movements: Extraocular movements intact.     Conjunctiva/sclera: Conjunctivae normal.     Pupils: Pupils are equal, round, and reactive to light.  Neck:     Thyroid: No thyroid mass, thyromegaly or thyroid  tenderness.     Vascular: No carotid bruit or JVD.     Trachea: Trachea and phonation normal.  Cardiovascular:     Rate and Rhythm: Normal rate and regular rhythm.     Chest Wall: PMI is not displaced.     Pulses: Normal pulses.     Heart sounds: Normal heart sounds. No murmur. No friction rub. No gallop.   Pulmonary:     Effort: Pulmonary effort is normal. No respiratory distress.     Breath sounds: Normal breath sounds. No wheezing.  Abdominal:     General: Bowel sounds are normal. There is no distension or abdominal bruit.     Palpations: Abdomen is soft. There is no hepatomegaly or splenomegaly.     Tenderness: There is no abdominal tenderness. There is no right CVA tenderness or left CVA tenderness.     Hernia: No hernia is present.  Musculoskeletal:        General: Normal range of motion.     Cervical back: Normal range of motion and neck supple.     Right lower leg: No edema.     Left lower leg: No edema.  Lymphadenopathy:     Cervical: No cervical adenopathy.  Skin:    General: Skin is warm and dry.     Capillary Refill: Capillary refill takes less than 2 seconds.     Coloration: Skin is not cyanotic, jaundiced or pale.     Findings: No rash.  Neurological:     General: No focal deficit present.     Mental Status: He is alert and oriented to person, place, and time.     Cranial Nerves: Cranial nerves are intact. No cranial nerve deficit.     Sensory: Sensation is intact. No sensory deficit.     Motor: Motor function is intact. No weakness.      Coordination: Coordination is intact. Coordination normal.     Gait: Gait is intact. Gait normal.     Deep Tendon Reflexes: Reflexes are normal and symmetric. Reflexes normal.  Psychiatric:        Attention and Perception: Attention and perception normal.        Mood and Affect: Mood and affect normal.        Speech: Speech normal.        Behavior: Behavior normal. Behavior is cooperative.        Thought Content: Thought content normal.        Cognition and Memory: Cognition and memory normal.        Judgment: Judgment normal.     Results for orders placed or performed in visit on 04/17/19  CBC with Differential/Platelet  Result Value Ref Range   WBC 6.2 3.4 - 10.8 x10E3/uL   RBC 4.26 4.14 - 5.80 x10E6/uL   Hemoglobin 12.8 (L) 13.0 - 17.7 g/dL   Hematocrit 39.5 37.5 - 51.0 %   MCV 93 79 - 97 fL   MCH 30.0 26.6 - 33.0 pg   MCHC 32.4 31.5 - 35.7 g/dL   RDW 12.1 11.6 - 15.4 %   Platelets 322 150 - 450 x10E3/uL   Neutrophils 53 Not Estab. %   Lymphs 36 Not Estab. %   Monocytes 8 Not Estab. %   Eos 1 Not Estab. %   Basos 1 Not Estab. %   Neutrophils Absolute 3.3 1.4 - 7.0 x10E3/uL   Lymphocytes Absolute 2.2 0.7 - 3.1 x10E3/uL   Monocytes Absolute 0.5 0.1 - 0.9 x10E3/uL   EOS (  ABSOLUTE) 0.1 0.0 - 0.4 x10E3/uL   Basophils Absolute 0.1 0.0 - 0.2 x10E3/uL   Immature Granulocytes 1 Not Estab. %   Immature Grans (Abs) 0.0 0.0 - 0.1 x10E3/uL  CMP14+EGFR  Result Value Ref Range   Glucose 144 (H) 65 - 99 mg/dL   BUN 14 8 - 27 mg/dL   Creatinine, Ser 1.21 0.76 - 1.27 mg/dL   GFR calc non Af Amer 60 >59 mL/min/1.73   GFR calc Af Amer 69 >59 mL/min/1.73   BUN/Creatinine Ratio 12 10 - 24   Sodium 138 134 - 144 mmol/L   Potassium 4.7 3.5 - 5.2 mmol/L   Chloride 101 96 - 106 mmol/L   CO2 23 20 - 29 mmol/L   Calcium 10.1 8.6 - 10.2 mg/dL   Total Protein 6.4 6.0 - 8.5 g/dL   Albumin 4.7 3.7 - 4.7 g/dL   Globulin, Total 1.7 1.5 - 4.5 g/dL   Albumin/Globulin Ratio 2.8 (H) 1.2 - 2.2    Bilirubin Total 0.6 0.0 - 1.2 mg/dL   Alkaline Phosphatase 57 39 - 117 IU/L   AST 31 0 - 40 IU/L   ALT 27 0 - 44 IU/L  Lipid panel  Result Value Ref Range   Cholesterol, Total 107 100 - 199 mg/dL   Triglycerides 110 0 - 149 mg/dL   HDL 39 (L) >39 mg/dL   VLDL Cholesterol Cal 20 5 - 40 mg/dL   LDL Chol Calc (NIH) 48 0 - 99 mg/dL   Chol/HDL Ratio 2.7 0.0 - 5.0 ratio  Bayer DCA Hb A1c Waived  Result Value Ref Range   HB A1C (BAYER DCA - WAIVED) 6.6 <7.0 %  Vitamin D 25 hydroxy  Result Value Ref Range   Vit D, 25-Hydroxy 44.4 30.0 - 100.0 ng/mL       Pertinent labs & imaging results that were available during my care of the patient were reviewed by me and considered in my medical decision making.  Assessment & Plan:  Matthew Daugherty was seen today for medical management of chronic issues, diabetes, hypothyroidism and hyperlipidemia.  Diagnoses and all orders for this visit:  Type 2 diabetes mellitus without complication, without long-term current use of insulin (HCC) A1C 6.6. Well controlled on current regimen. Will continue. Follow up in 3 months or sooner if needed. Continue lifestyle changes.   Hypertension associated with type 2 diabetes mellitus (HCC) BP well controlled. Changes were not made in regimen today. Goal BP is 130/80. Pt aware to report any persistent high or low readings. DASH diet and exercise encouraged. Exercise at least 150 minutes per week and increase as tolerated. Goal BMI > 25. Stress management encouraged. Avoid nicotine and tobacco product use. Avoid excessive alcohol and NSAID's. Avoid more than 2000 mg of sodium daily. Medications as prescribed. Follow up as scheduled.    Hyperlipidemia associated with type 2 diabetes mellitus ([place]) Diet encouraged - increase intake of fresh fruits and vegetables, increase intake of lean proteins. Bake, broil, or grill foods. Avoid fried, greasy, and fatty foods. Avoid fast foods. Increase intake of fiber-rich whole grains. Exercise  encouraged - at least 150 minutes per week and advance as tolerated.  Goal BMI < 25. Continue medications as prescribed. Follow up in 3-6 months as discussed.   Iron deficiency anemia secondary to inadequate dietary iron intake Hgb and Hct stable. Will continue below.  -     FeFum-FePoly-FA-B Cmp-C-Biot (INTEGRA PLUS) CAPS; Take 1 capsule by mouth daily.  Aortic atherosclerosis (HCC) Stable, continue  statin and ASA therapy.     Continue all other maintenance medications.  Follow up plan: Return in about 3 months (around 07/25/2019), or if symptoms worsen or fail to improve, for DM.  Continue healthy lifestyle choices, including diet (rich in fruits, vegetables, and lean proteins, and low in salt and simple carbohydrates) and exercise (at least 30 minutes of moderate physical activity daily).  Educational handout given for DM  The above assessment and management plan was discussed with the patient. The patient verbalized understanding of and has agreed to the management plan. Patient is aware to call the clinic if they develop any new symptoms or if symptoms persist or worsen. Patient is aware when to return to the clinic for a follow-up visit. Patient educated on when it is appropriate to go to the emergency department.   Monia Pouch, FNP-C [facility] [phone]

## 2019-06-01 ENCOUNTER — Ambulatory Visit (INDEPENDENT_AMBULATORY_CARE_PROVIDER_SITE_OTHER): Payer: Medicare Other | Admitting: *Deleted

## 2019-06-01 VITALS — Ht 70.0 in | Wt 165.0 lb

## 2019-06-01 DIAGNOSIS — Z0000 Encounter for general adult medical examination without abnormal findings: Secondary | ICD-10-CM

## 2019-06-01 NOTE — Progress Notes (Signed)
MEDICARE ANNUAL WELLNESS VISIT  06/01/2019  Telephone Visit Disclaimer This Medicare AWV was conducted by telephone due to national recommendations for restrictions regarding the COVID-19 Pandemic (e.g. social distancing).  I verified, using two identifiers, that I am speaking with Matthew Daugherty or their authorized healthcare agent. I discussed the limitations, risks, security, and privacy concerns of performing an evaluation and management service by telephone and the potential availability of an in-person appointment in the future. The patient expressed understanding and agreed to proceed.   Subjective:  Matthew Daugherty is a 74 y.o. male patient of Rakes, Connye Burkitt, FNP who had a Medicare Annual Wellness Visit today via telephone. Nyaire is Retired and lives with their spouse. he has 1 child. he reports that he is socially active and does interact with friends/family regularly. he is minimally physically active and enjoys gardening.  Patient Care Team: Baruch Gouty, FNP as PCP - General (Family Medicine) Clarene Essex, MD (Gastroenterology) Minus Breeding, MD as Consulting Physician (Cardiology)  Advanced Directives 06/01/2019 05/30/2018 05/25/2017 02/22/2017 01/26/2017 12/27/2015 06/20/2015  Does Patient Have a Medical Advance Directive? Yes Yes No Yes Yes Yes No  Type of Paramedic of [place];Living will Healthcare Power of [place] -  Does patient want to make changes to medical advance directive? No - Patient declined No - Patient declined - No - Patient declined No - Patient declined No - Patient declined -  Copy of [place] in Chart? No - copy requested No - copy requested - No - copy requested No - copy requested No - copy requested -  Would patient like information on creating a medical advance directive? - - Yes (MAU/Ambulatory/Procedural Areas - Information given) -  - - [facility] Utilization Over the Past 12 Months: # of hospitalizations or ER visits: 0 # of surgeries: 0  Review of Systems    Patient reports that his overall health is unchanged compared to last year.  History obtained from chart review and the patient General ROS: negative  Patient Reported Readings (BP, Pulse, CBG, Weight, etc) CBG 121  Pain Assessment Pain : No/denies pain     Current Medications & Allergies (verified) Allergies as of 06/01/2019      Reactions   Niaspan [niacin Er] Other (See Comments)   Increases blood sugars      Medication List       Accurate as of June 01, 2019  8:52 AM. If you have any questions, ask your nurse or doctor.        aspirin 81 MG EC tablet Take 81 mg by mouth daily.   atorvastatin 80 MG tablet Commonly known as: LIPITOR TAKE 1 TABLET EVERY DAY AT 6:00PM   fenofibrate 160 MG tablet TAKE 1 TABLET EVERY DAY   Integra Plus Caps Take 1 capsule by mouth daily.   levothyroxine 100 MCG tablet Commonly known as: SYNTHROID TAKE 1 TABLET (100 MCG TOTAL) BY MOUTH DAILY BEFORE BREAKFAST.   losartan-hydrochlorothiazide 100-25 MG tablet Commonly known as: HYZAAR TAKE 1 TABLET EVERY DAY   metFORMIN 1000 MG tablet Commonly known as: GLUCOPHAGE TAKE 1 TABLET 2 TIMES DAILY WITH A MEAL.   multivitamin with minerals Tabs tablet Take 1 tablet by mouth daily.   Vitamin D-3 125 MCG (5000 UT) Tabs Take 1 tablet by mouth daily.       History (reviewed): Past Medical  History:  Diagnosis Date  . Cardiac dysrhythmia, unspecified   . Diabetes mellitus without complication ([place])   . Essential hypertension, benign   . Hyperplasia of prostate   . Other and unspecified hyperlipidemia   . Thyroid disease   . Unspecified transient cerebral ischemia    Past Surgical History:  Procedure Laterality Date  . APPENDECTOMY    . CHOLECYSTECTOMY    . LAPAROSCOPIC CHOLECYSTECTOMY SINGLE SITE WITH INTRAOPERATIVE CHOLANGIOGRAM N/A  06/22/2015   Procedure: LAPAROSCOPIC CHOLECYSTECTOMY SINGLE SITE WITH INTRAOPERATIVE CHOLANGIOGRAM, PRIMARY UMBILICAL HERNIA REPAIR;  Surgeon: Michael Boston, MD;  Location: [facility];  Service: General;  Laterality: N/A;   Family History  Problem Relation Age of Onset  . Cancer Mother        bone  . Asthma Father   . Heart attack Father        45   Social History   Socioeconomic History  . Marital status: Married    Spouse name: [name]  . Number of children: 1  . Years of education: Not on file  . Highest education level: High school graduate  Occupational History  . Occupation: retired    Fish farm manager: [employer]  Tobacco Use  . Smoking status: Former Smoker    Packs/day: 1.00    Years: 44.00    Pack years: 44.00    Types: Cigarettes    Start date: 04/20/1954    Quit date: 06/13/1998    Years since quitting: 20.9  . Smokeless tobacco: Never Used  Substance and Sexual Activity  . Alcohol use: No  . Drug use: No  . Sexual activity: Yes  Other Topics Concern  . Not on file  Social History Narrative   Patient lives with his wife in a 2 story home.  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  He has one son, and 2 grand daughters.   Social Determinants of Health   Financial Resource Strain: Low Risk   . Difficulty of Paying Living Expenses: Not hard at all  Food Insecurity: No Food Insecurity  . Worried About Charity fundraiser in the Last Year: Never true  . Ran Out of Food in the Last Year: Never true  Transportation Needs: No Transportation Needs  . Lack of Transportation (Medical): No  . Lack of Transportation (Non-Medical): No  Physical Activity: Insufficiently Active  . Days of Exercise per Week: 2 days  . Minutes of Exercise per Session: 60 min  Stress: No Stress Concern Present  . Feeling of Stress : Not at all  Social Connections: Not Isolated  . Frequency of Communication with Friends and Family: More than three times a week   . Frequency of Social Gatherings with Friends and Family: More than three times a week  . Attends Religious Services: More than 4 times per year  . Active Member of Clubs or Organizations: Yes  . Attends Archivist Meetings: More than 4 times per year  . Marital Status: Married    Activities of Daily Living In your present state of health, do you have any difficulty performing the following activities: 06/01/2019  Hearing? N  Vision? N  Difficulty concentrating or making decisions? N  Walking or climbing stairs? N  Dressing or bathing? N  Doing errands, shopping? N  Preparing Food and eating ? N  Using the Toilet? N  In the past six months, have you accidently leaked urine? N  Do you have problems with loss of  bowel control? N  Managing your Medications? N  Managing your Finances? N  Housekeeping or managing your Housekeeping? N  Some recent data might be hidden    Patient Education/ Literacy How often do you need to have someone help you when you read instructions, pamphlets, or other written materials from your doctor or pharmacy?: 1 - Never What is the last grade level you completed in school?: 12th  Exercise Current Exercise Habits: Home exercise routine, Type of exercise: walking, Time (Minutes): 60, Frequency (Times/Week): 2, Weekly Exercise (Minutes/Week): 120, Intensity: Mild, Exercise limited by: None identified  Diet Patient reports consuming 3 meals a day and 2 snack(s) a day Patient reports that his primary diet is: Regular Patient reports that she does have regular access to food.   Depression Screen PHQ 2/9 Scores 04/26/2019 01/17/2019 05/30/2018 05/17/2018 01/06/2018 09/02/2017 05/25/2017  PHQ - 2 Score 0 0 0 0 0 0 0     Fall Risk Fall Risk  06/01/2019 04/26/2019 01/17/2019 05/30/2018 05/17/2018  Falls in the past year? 0 0 0 0 0  Number falls in past yr: 0 - - - -  Injury with Fall? 0 - - - -  Risk for fall due to : - - - - -  Follow up Falls evaluation  completed - - - -     Objective:  Matthew Daugherty seemed alert and oriented and he participated appropriately during our telephone visit.  Blood Pressure Weight BMI  BP Readings from Last 3 Encounters:  04/26/19 131/68  01/17/19 133/70  05/30/18 122/81   Wt Readings from Last 3 Encounters:  06/01/19 165 lb (74.8 kg)  04/26/19 165 lb (74.8 kg)  01/17/19 171 lb (77.6 kg)   BMI Readings from Last 1 Encounters:  06/01/19 23.68 kg/m    *Unable to obtain current vital signs, weight, and BMI due to telephone visit type  Hearing/Vision  . Eulan did not seem to have difficulty with hearing/understanding during the telephone conversation . Reports that he has had a formal eye exam by an eye care professional within the past year . Reports that he has not had a formal hearing evaluation within the past year *Unable to fully assess hearing and vision during telephone visit type  Cognitive Function: 6CIT Screen 06/01/2019  What Year? 0 points  What month? 0 points  What time? 0 points  Count back from 20 0 points  Months in reverse 0 points  Repeat phrase 0 points  Total Score 0   (Normal:0-7, Significant for Dysfunction: >8)  Normal Cognitive Function Screening: Yes   Immunization & Health Maintenance Record Immunization History  Administered Date(s) Administered  . Fluad Quad(high Dose 65+) 01/17/2019  . Influenza Whole 11/18/2008  . Influenza, High Dose Seasonal PF 02/22/2015, 03/09/2016, 04/30/2017, 02/17/2018  . Influenza,inj,Quad PF,6+ Mos 04/11/2013, 04/19/2014  . Pneumococcal Conjugate-13 04/11/2013  . Pneumococcal Polysaccharide-23 04/19/2014  . Td 01/17/2019  . Tdap 06/18/2008  . Zoster 04/30/2010    Health Maintenance  Topic Date Due  . OPHTHALMOLOGY EXAM  01/29/2019  . FOOT EXAM  05/18/2019  . Hepatitis C Screening  07/25/2022 (Originally 1946-02-17)  . HEMOGLOBIN A1C  10/16/2019  . COLONOSCOPY  02/22/2024  . TETANUS/TDAP  01/16/2029  . INFLUENZA VACCINE   Completed  . PNA vac Low Risk Adult  Completed  . COLON CANCER SCREENING ANNUAL FOBT  Discontinued       Assessment  This is a routine wellness examination for Matthew Daugherty.  Health Maintenance: Due or  Overdue Health Maintenance Due  Topic Date Due  . OPHTHALMOLOGY EXAM  01/29/2019  . FOOT EXAM  05/18/2019    Matthew Daugherty does not need a referral for Community Assistance: Care Management:   no Social Work:    no Prescription Assistance:  no Nutrition/Diabetes Education:  no   Plan:  Personalized Goals Goals Addressed   None    Personalized Health Maintenance & Screening Recommendations  UP to date  Lung Cancer Screening Recommended: no (Low Dose CT Chest recommended if Age 79-80 years, 30 pack-year currently smoking OR have quit w/in past 15 years) Hepatitis C Screening recommended: no HIV Screening recommended: no  Advanced Directives: Written information was not prepared per patient's request.  Referrals & Orders No orders of the defined types were placed in this encounter.   Follow-up Plan . Follow-up with Baruch Gouty, FNP as planned    I have personally reviewed and noted the following in the patient's chart:   . Medical and social history . Use of alcohol, tobacco or illicit drugs  . Current medications and supplements . Functional ability and status . Nutritional status . Physical activity . Advanced directives . List of other physicians . Hospitalizations, surgeries, and ER visits in previous 12 months . Vitals . Screenings to include cognitive, depression, and falls . Referrals and appointments  In addition, I have reviewed and discussed with Matthew Daugherty certain preventive protocols, quality metrics, and best practice recommendations. A written personalized care plan for preventive services as well as general preventive health recommendations is available and can be mailed to the patient at his request.      Wardell Heath,  LPN  [id number]

## 2019-06-12 ENCOUNTER — Other Ambulatory Visit: Payer: Self-pay | Admitting: Family Medicine

## 2019-07-05 ENCOUNTER — Telehealth: Payer: Self-pay | Admitting: Family Medicine

## 2019-07-05 NOTE — Chronic Care Management (AMB) (Signed)
  Chronic Care Management   Outreach Note  07/05/2019 Name: [name] MRN: [id number] DOB: 29-Jan-1946  Matthew Daugherty is a 74 y.o. year old male who is a primary care patient of Rakes, Connye Burkitt, FNP. I reached out to Matthew Daugherty by phone today in response to a referral sent by Mr. Matthew Daugherty's health plan.     An unsuccessful telephone outreach was attempted today. The patient was referred to the case management team for assistance with care management and care coordination.   Follow Up Plan: A HIPPA compliant phone message was left for the patient providing contact information and requesting a return call.  The care management team will reach out to the patient again over the next 7 days.  If patient returns call to provider office, please advise to call [facility] at [place], [place], [place], [place] [address] Direct Dial: [phone] [email or web address] Website: [email or web address]

## 2019-07-05 NOTE — Chronic Care Management (AMB) (Signed)
  Chronic Care Management   Note  07/05/2019 Name: Matthew Daugherty MRN: [id number] DOB: [birth year]  Matthew Daugherty is a 74 y.o. year old male who is a primary care patient of Rakes, Connye Burkitt, FNP. I reached out to Matthew Daugherty by phone today in response to a referral sent by Matthew Daugherty's health plan.     Matthew Daugherty was given information about Chronic Care Management services today including:  1. CCM service includes personalized support from designated clinical staff supervised by his physician, including individualized plan of care and coordination with other care providers 2. 24/7 contact phone numbers for assistance for urgent and routine care needs. 3. Service will only be billed when office clinical staff spend 20 minutes or more in a month to coordinate care. 4. Only one practitioner may furnish and bill the service in a calendar month. 5. The patient may stop CCM services at any time (effective at the end of the month) by phone call to the office staff. 6. The patient will be responsible for cost sharing (co-pay) of up to 20% of the service fee (after annual deductible is met).  Patient agreed to services and verbal consent obtained.   Follow up plan: Telephone appointment with care management team member scheduled for:11/08/2019  Noreene Larsson, [address], [place], [place] [address] Direct Dial: [phone] [email or web address] Website: [email or web address]

## 2019-07-17 ENCOUNTER — Other Ambulatory Visit: Payer: Self-pay

## 2019-07-17 ENCOUNTER — Other Ambulatory Visit: Payer: Medicare Other

## 2019-07-17 ENCOUNTER — Other Ambulatory Visit: Payer: Self-pay | Admitting: *Deleted

## 2019-07-17 ENCOUNTER — Other Ambulatory Visit: Payer: Self-pay | Admitting: Family Medicine

## 2019-07-17 DIAGNOSIS — E785 Hyperlipidemia, unspecified: Secondary | ICD-10-CM

## 2019-07-17 DIAGNOSIS — E559 Vitamin D deficiency, unspecified: Secondary | ICD-10-CM

## 2019-07-17 DIAGNOSIS — N400 Enlarged prostate without lower urinary tract symptoms: Secondary | ICD-10-CM

## 2019-07-17 DIAGNOSIS — E119 Type 2 diabetes mellitus without complications: Secondary | ICD-10-CM

## 2019-07-17 DIAGNOSIS — D473 Essential (hemorrhagic) thrombocythemia: Secondary | ICD-10-CM | POA: Diagnosis not present

## 2019-07-17 DIAGNOSIS — E1159 Type 2 diabetes mellitus with other circulatory complications: Secondary | ICD-10-CM

## 2019-07-17 DIAGNOSIS — D75839 Thrombocytosis, unspecified: Secondary | ICD-10-CM

## 2019-07-17 DIAGNOSIS — E1169 Type 2 diabetes mellitus with other specified complication: Secondary | ICD-10-CM

## 2019-07-17 DIAGNOSIS — I10 Essential (primary) hypertension: Secondary | ICD-10-CM | POA: Diagnosis not present

## 2019-07-17 LAB — BAYER DCA HB A1C WAIVED: HB A1C (BAYER DCA - WAIVED): 7 % — ABNORMAL HIGH (ref <7.0)

## 2019-07-18 LAB — LIPID PANEL
Chol/HDL Ratio: 2.6 ratio (ref 0.0–5.0)
Cholesterol, Total: 111 mg/dL (ref 100–199)
HDL: 43 mg/dL (ref >39)
LDL Chol Calc (NIH): 51 mg/dL (ref 0–99)
Triglycerides: 87 mg/dL (ref 0–149)
VLDL Cholesterol Cal: 17 mg/dL (ref 5–40)

## 2019-07-18 LAB — CMP14+EGFR
ALT: 32 IU/L (ref 0–44)
AST: 30 IU/L (ref 0–40)
Albumin/Globulin Ratio: 2.1 (ref 1.2–2.2)
Albumin: 4.8 g/dL — ABNORMAL HIGH (ref 3.7–4.7)
Alkaline Phosphatase: 65 IU/L (ref 39–117)
BUN/Creatinine Ratio: 13 (ref 10–24)
BUN: 14 mg/dL (ref 8–27)
Bilirubin Total: 0.7 mg/dL (ref 0.0–1.2)
CO2: 23 mmol/L (ref 20–29)
Calcium: 10.1 mg/dL (ref 8.6–10.2)
Chloride: 99 mmol/L (ref 96–106)
Creatinine, Ser: 1.1 mg/dL (ref 0.76–1.27)
GFR calc Af Amer: 78 mL/min/{1.73_m2} (ref >59)
GFR calc non Af Amer: 67 mL/min/{1.73_m2} (ref >59)
Globulin, Total: 2.3 g/dL (ref 1.5–4.5)
Glucose: 142 mg/dL — ABNORMAL HIGH (ref 65–99)
Potassium: 4.6 mmol/L (ref 3.5–5.2)
Sodium: 139 mmol/L (ref 134–144)
Total Protein: 7.1 g/dL (ref 6.0–8.5)

## 2019-07-18 LAB — CBC WITH DIFFERENTIAL/PLATELET
Basophils Absolute: 0 10*3/uL (ref 0.0–0.2)
Basos: 1 %
EOS (ABSOLUTE): 0 10*3/uL (ref 0.0–0.4)
Eos: 1 %
Hematocrit: 37.9 % (ref 37.5–51.0)
Hemoglobin: 13.1 g/dL (ref 13.0–17.7)
Immature Grans (Abs): 0 10*3/uL (ref 0.0–0.1)
Immature Granulocytes: 0 %
Lymphocytes Absolute: 2.1 10*3/uL (ref 0.7–3.1)
Lymphs: 31 %
MCH: 31.4 pg (ref 26.6–33.0)
MCHC: 34.6 g/dL (ref 31.5–35.7)
MCV: 91 fL (ref 79–97)
Monocytes Absolute: 0.5 10*3/uL (ref 0.1–0.9)
Monocytes: 7 %
Neutrophils Absolute: 4.1 10*3/uL (ref 1.4–7.0)
Neutrophils: 60 %
Platelets: 350 10*3/uL (ref 150–450)
RBC: 4.17 x10E6/uL (ref 4.14–5.80)
RDW: 12.2 % (ref 11.6–15.4)
WBC: 6.7 10*3/uL (ref 3.4–10.8)

## 2019-07-18 LAB — THYROID PANEL WITH TSH
Free Thyroxine Index: 3.6 (ref 1.2–4.9)
T3 Uptake Ratio: 29 % (ref 24–39)
T4, Total: 12.3 ug/dL — ABNORMAL HIGH (ref 4.5–12.0)
TSH: 1.04 u[IU]/mL (ref 0.450–4.500)

## 2019-07-18 LAB — VITAMIN D 25 HYDROXY (VIT D DEFICIENCY, FRACTURES): Vit D, 25-Hydroxy: 61.4 ng/mL (ref 30.0–100.0)

## 2019-07-19 IMAGING — CT CT CHEST W/O CM
2 of 4 series · 15 of 36 positions shown, 18 images · non-contrast
Comparison: None.

CLINICAL DATA: Followup lingular nodule.

EXAM:
CT CHEST WITHOUT CONTRAST
TECHNIQUE: Multidetector CT imaging of the chest was performed following the
standard protocol without IV contrast.

[Series 2: thorax · axial · 0.71mm/px · z∈[+1280,+1586]mm · 12 of 179 slices shown, 15 images]
[im 13/179  mediastinal]
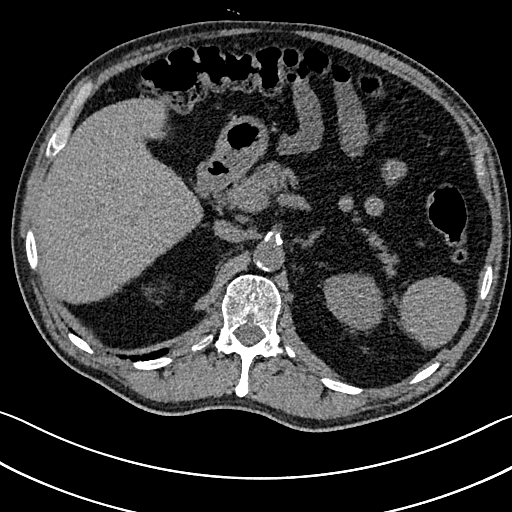
[im 13/179  lung]
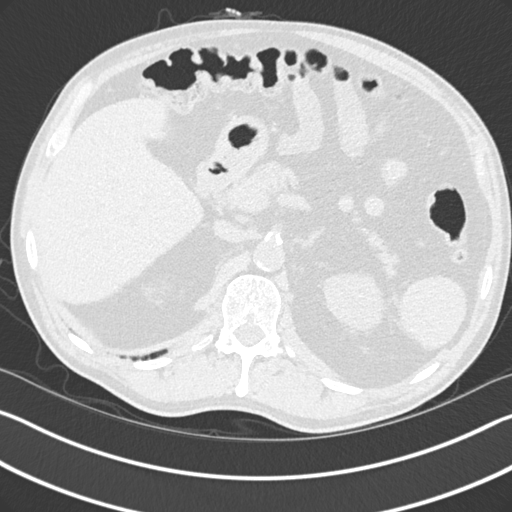
[im 26/179  lung]
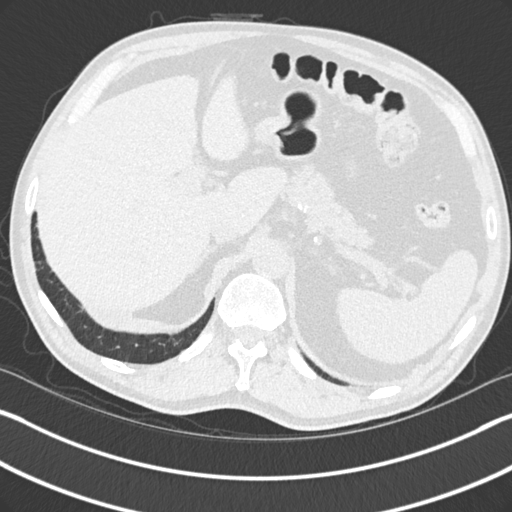
[im 39/179  lung]
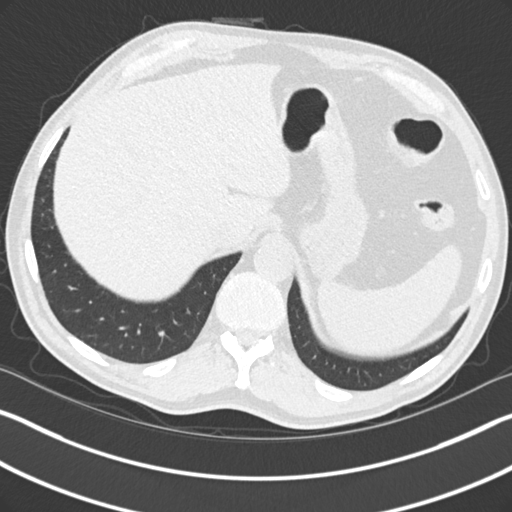
[im 51/179  lung]
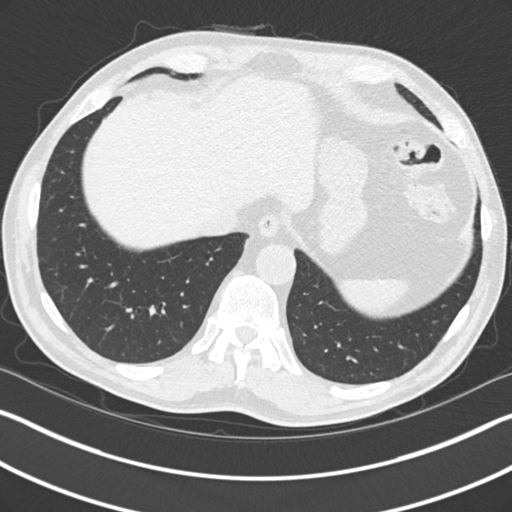
[im 64/179  mediastinal]
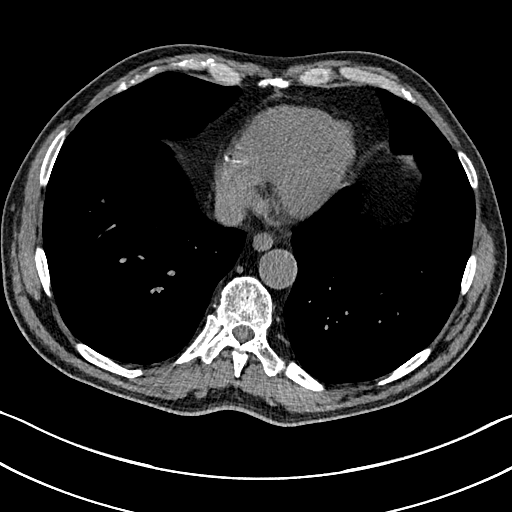
[im 64/179  lung]
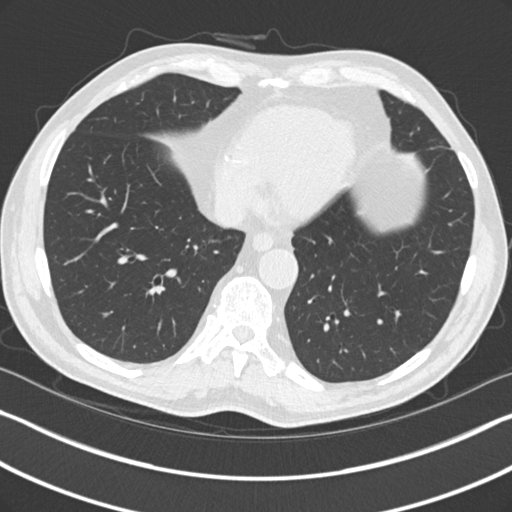
[im 77/179  lung]
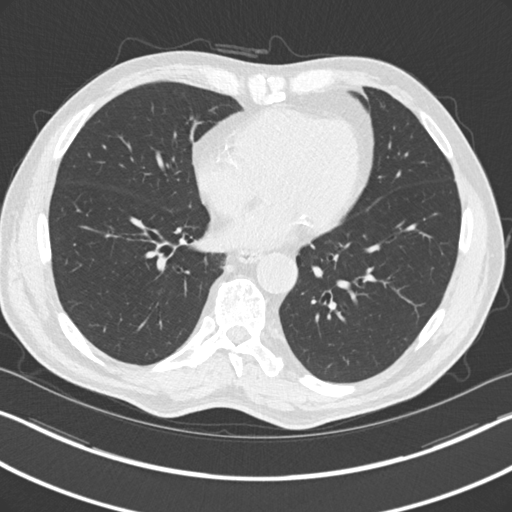
[im 102/179  lung]
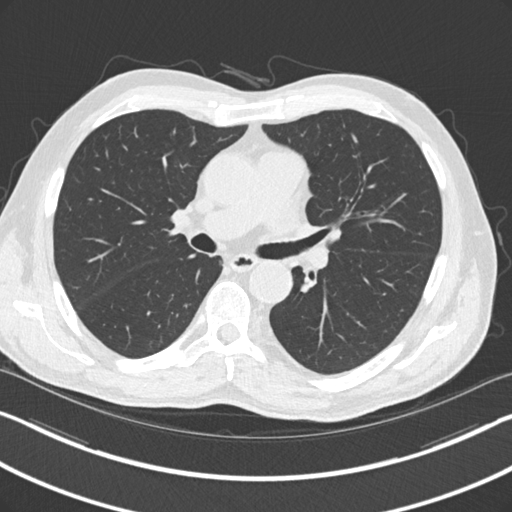
[im 115/179  lung]
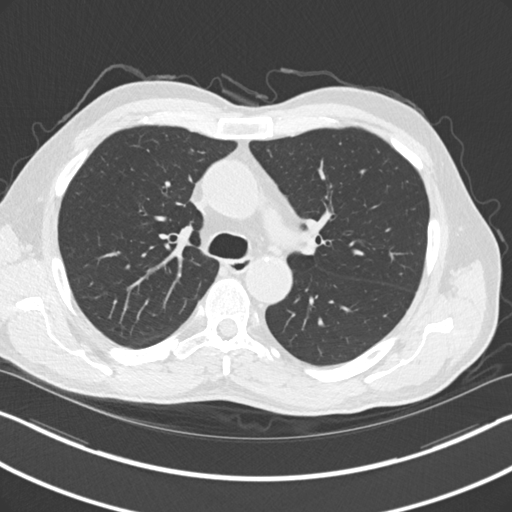
[im 128/179  mediastinal]
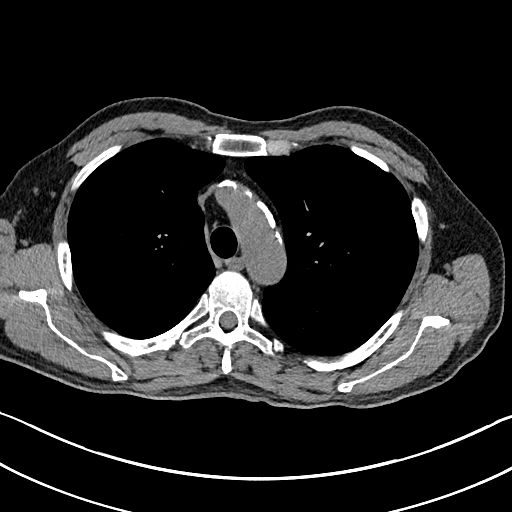
[im 128/179  lung]
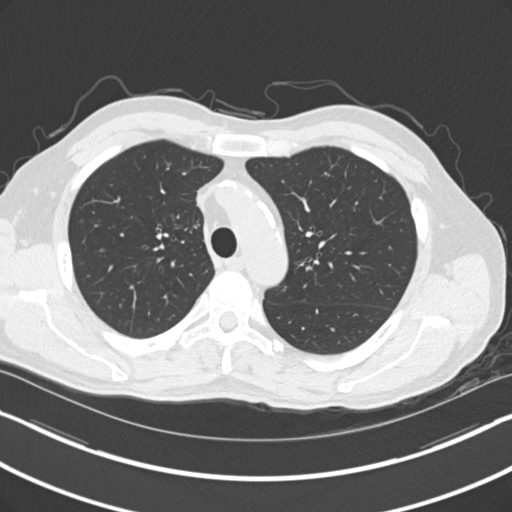
[im 140/179  lung]
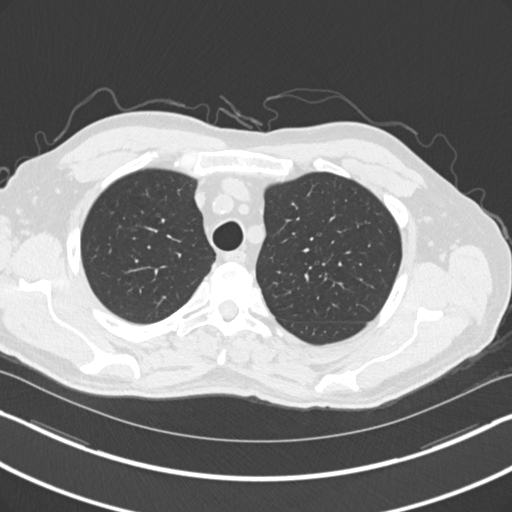
[im 153/179  lung]
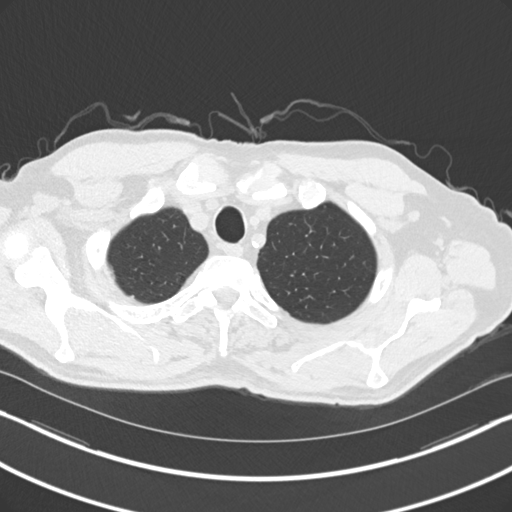
[im 166/179  lung]
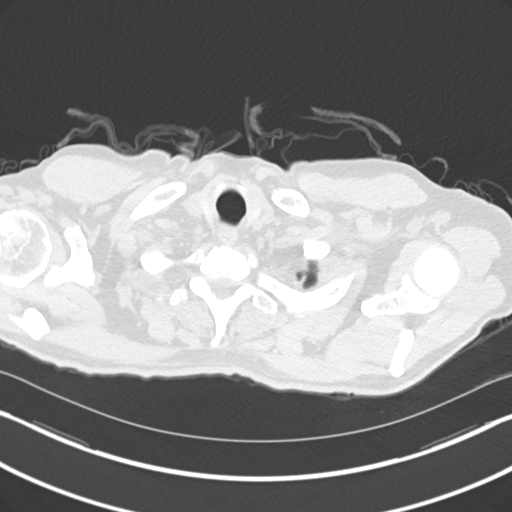

[Series 5: coronal · coronal · 0.70mm/px · 3 of 140 slices shown]
[im 28/140  lung]
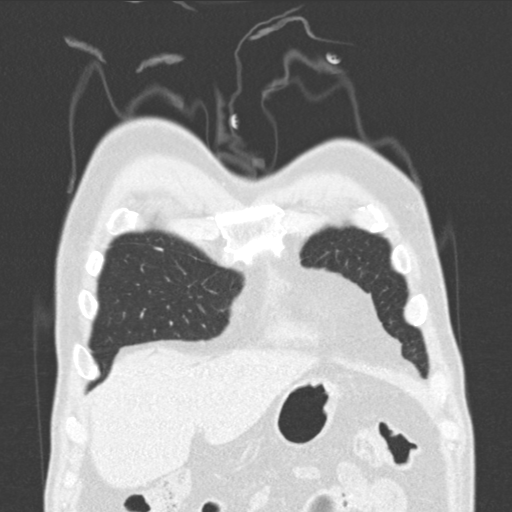
[im 56/140  lung]
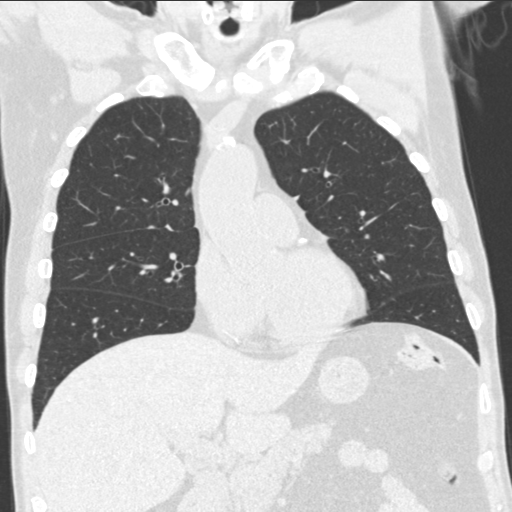
[im 84/140  lung]
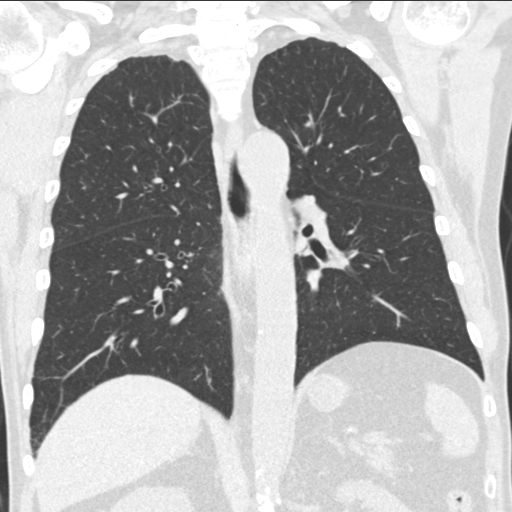

[15 of 36 positions shown; findings below may reference images not displayed]

FINDINGS: Cardiovascular: Coronary artery calcification and aortic
atherosclerotic calcification.

Mediastinum/Nodes: No axillary supraclavicular adenopathy. No
mediastinal hilar adenopathy.

Lungs/Pleura: Solid pulmonary nodule in the superior segment RIGHT
lower lobe measures 5 mm and not changed in 11 month interval.
Additional lower lobe nodules are stable as previously described. No
new pulmonary nodularity.

Upper Abdomen: Limited view of the liver, kidneys, pancreas are
unremarkable. Normal adrenal glands.

Musculoskeletal: No aggressive osseous lesion.
IMPRESSION: 1. Stable RIGHT lower lobe pulmonary nodule over 1 year interval
consistent with benign etiology per Fleischner criteria.
2. Additional pulmonary nodules are stable and consistent with
benign etiology.

Aortic Atherosclerosis ([id number]).

## 2019-07-26 ENCOUNTER — Encounter: Payer: Self-pay | Admitting: Family Medicine

## 2019-07-26 ENCOUNTER — Other Ambulatory Visit: Payer: Self-pay | Admitting: Family Medicine

## 2019-07-26 ENCOUNTER — Other Ambulatory Visit: Payer: Self-pay

## 2019-07-26 ENCOUNTER — Ambulatory Visit (INDEPENDENT_AMBULATORY_CARE_PROVIDER_SITE_OTHER): Payer: Medicare Other | Admitting: Family Medicine

## 2019-07-26 VITALS — BP 126/74 | HR 84 | Temp 98.4°F | Ht 70.0 in | Wt 167.4 lb

## 2019-07-26 DIAGNOSIS — E785 Hyperlipidemia, unspecified: Secondary | ICD-10-CM

## 2019-07-26 DIAGNOSIS — D75839 Thrombocytosis, unspecified: Secondary | ICD-10-CM

## 2019-07-26 DIAGNOSIS — I10 Essential (primary) hypertension: Secondary | ICD-10-CM | POA: Diagnosis not present

## 2019-07-26 DIAGNOSIS — E1159 Type 2 diabetes mellitus with other circulatory complications: Secondary | ICD-10-CM

## 2019-07-26 DIAGNOSIS — I152 Hypertension secondary to endocrine disorders: Secondary | ICD-10-CM

## 2019-07-26 DIAGNOSIS — D508 Other iron deficiency anemias: Secondary | ICD-10-CM

## 2019-07-26 DIAGNOSIS — E039 Hypothyroidism, unspecified: Secondary | ICD-10-CM | POA: Diagnosis not present

## 2019-07-26 DIAGNOSIS — E1169 Type 2 diabetes mellitus with other specified complication: Secondary | ICD-10-CM | POA: Diagnosis not present

## 2019-07-26 DIAGNOSIS — E559 Vitamin D deficiency, unspecified: Secondary | ICD-10-CM

## 2019-07-26 DIAGNOSIS — D473 Essential (hemorrhagic) thrombocythemia: Secondary | ICD-10-CM | POA: Diagnosis not present

## 2019-07-26 DIAGNOSIS — I700 Atherosclerosis of aorta: Secondary | ICD-10-CM

## 2019-07-26 DIAGNOSIS — E119 Type 2 diabetes mellitus without complications: Secondary | ICD-10-CM

## 2019-07-26 MED ORDER — LEVOTHYROXINE SODIUM 100 MCG PO TABS: 100.0000 ug | ORAL_TABLET | Freq: Every day | ORAL | 3 refills | Status: DC

## 2019-07-26 MED ORDER — FENOFIBRATE 160 MG PO TABS: 160.0000 mg | ORAL_TABLET | Freq: Every day | ORAL | 2 refills | Status: DC

## 2019-07-26 MED ORDER — LOSARTAN POTASSIUM-HCTZ 100-25 MG PO TABS: 1.0000 | ORAL_TABLET | Freq: Every day | ORAL | 2 refills | Status: DC

## 2019-07-26 MED ORDER — INTEGRA PLUS PO CAPS: 1.0000 | ORAL_CAPSULE | Freq: Every day | ORAL | 4 refills | Status: DC

## 2019-07-26 MED ORDER — ATORVASTATIN CALCIUM 80 MG PO TABS: ORAL_TABLET | ORAL | 2 refills | Status: DC

## 2019-07-26 MED ORDER — METFORMIN HCL 1000 MG PO TABS: ORAL_TABLET | ORAL | 2 refills | Status: DC

## 2019-07-26 NOTE — Progress Notes (Addendum)
Subjective:  Patient ID: Matthew Daugherty, male    DOB: 09/18/1945, 74 y.o.   MRN: [id number]  Patient Care Team: Janora Norlander, DO as PCP - General (Family Medicine) Clarene Essex, MD (Gastroenterology) Minus Breeding, MD as Consulting Physician (Cardiology) Ilean China, RN as Registered Nurse   Chief Complaint:  Medical Management of Chronic Issues and Diabetes   HPI: Matthew Daugherty is a 74 y.o. male presenting on 07/26/2019 for Medical Management of Chronic Issues and Diabetes   1. Type 2 diabetes mellitus without complication, without long-term current use of insulin (HCC) Hgb A1c slightly up from last visit. Patients admits to adherence to diet. Decreased activity level. Eye exam is up to date. Is taking ARB, statin, and ASA.  2. Hyperlipidemia associated with type 2 diabetes mellitus ([place]) Tries to watch diet. Is compliant with statin and fenofibrate without associated side effects. No myalgias or arthralgias.   3. Vitamin D deficiency On repletion therapy and doing well. No arthralgias or myalgias. No depressed appetite or mood.   4. Hypertension associated with type 2 diabetes mellitus ([place]) Compliant with medications. No chest pain, shortness of breath, palpitations, leg swelling, headaches, weakness, or confusion.   5. Thrombocytosis (HCC) No abnormal bleeding or bruising. No petechial rash.   6. Iron deficiency anemia secondary to inadequate dietary iron intake Taking Integra Plus without associated side effects and doing well. No constipation.   7. Acquired hypothyroidism No hyper- or hypothyroid symptoms. Compliant with repletion therapy and tolerating well.   8. Aortic atherosclerosis (HCC) On ASA and statin therapy daily and tolerating well. No chest or abdominal pain. No weakness or tingling of lower extremities.     Relevant past medical, surgical, family, and social history reviewed and updated as indicated.  Allergies and medications reviewed and  updated. Date reviewed: Chart in Epic.   Past Medical History:  Diagnosis Date  . Cardiac dysrhythmia, unspecified   . Diabetes mellitus without complication ([place])   . Essential hypertension, benign   . Hyperplasia of prostate   . Other and unspecified hyperlipidemia   . Thyroid disease   . Unspecified transient cerebral ischemia     Past Surgical History:  Procedure Laterality Date  . APPENDECTOMY    . CHOLECYSTECTOMY    . LAPAROSCOPIC CHOLECYSTECTOMY SINGLE SITE WITH INTRAOPERATIVE CHOLANGIOGRAM N/A 06/22/2015   Procedure: LAPAROSCOPIC CHOLECYSTECTOMY SINGLE SITE WITH INTRAOPERATIVE CHOLANGIOGRAM, PRIMARY UMBILICAL HERNIA REPAIR;  Surgeon: Michael Boston, MD;  Location: [facility];  Service: General;  Laterality: N/A;    Social History   Socioeconomic History  . Marital status: Married    Spouse name: [name]  . Number of children: 1  . Years of education: Not on file  . Highest education level: High school graduate  Occupational History  . Occupation: retired    Fish farm manager: [employer]  Tobacco Use  . Smoking status: Former Smoker    Packs/day: 1.00    Years: 44.00    Pack years: 44.00    Types: Cigarettes    Start date: 04/20/1954    Quit date: 06/13/1998    Years since quitting: 21.1  . Smokeless tobacco: Never Used  Substance and Sexual Activity  . Alcohol use: No  . Drug use: No  . Sexual activity: Yes  Other Topics Concern  . Not on file  Social History Narrative   Patient lives with his wife in a 2 story home.  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  He has one son, and 2 grand daughters.   Social Determinants of Health   Financial Resource Strain: Low Risk   . Difficulty of Paying Living Expenses: Not hard at all  Food Insecurity: No Food Insecurity  . Worried About Charity fundraiser in the Last Year: Never true  . Ran Out of Food in the Last Year: Never true  Transportation Needs: No Transportation Needs  . Lack  of Transportation (Medical): No  . Lack of Transportation (Non-Medical): No  Physical Activity: Insufficiently Active  . Days of Exercise per Week: 2 days  . Minutes of Exercise per Session: 60 min  Stress: No Stress Concern Present  . Feeling of Stress : Not at all  Social Connections: Not Isolated  . Frequency of Communication with Friends and Family: More than three times a week  . Frequency of Social Gatherings with Friends and Family: More than three times a week  . Attends Religious Services: More than 4 times per year  . Active Member of Clubs or Organizations: Yes  . Attends Archivist Meetings: More than 4 times per year  . Marital Status: Married  Human resources officer Violence: Not At Risk  . Fear of Current or Ex-Partner: No  . Emotionally Abused: No  . Physically Abused: No  . Sexually Abused: No    Outpatient Encounter Medications as of 07/26/2019  Medication Sig  . aspirin 81 MG EC tablet Take 81 mg by mouth daily.    Marland Kitchen atorvastatin (LIPITOR) 80 MG tablet TAKE 1 TABLET EVERY DAY AT 6:00PM  . Cholecalciferol (VITAMIN D-3) 5000 UNITS TABS Take 1 tablet by mouth daily.    Marland Kitchen FeFum-FePoly-FA-B Cmp-C-Biot (INTEGRA PLUS) CAPS Take 1 capsule by mouth daily.  . fenofibrate 160 MG tablet Take 1 tablet (160 mg total) by mouth daily.  Marland Kitchen levothyroxine (SYNTHROID) 100 MCG tablet Take 1 tablet (100 mcg total) by mouth daily before breakfast.  . losartan-hydrochlorothiazide (HYZAAR) 100-25 MG tablet Take 1 tablet by mouth daily.  . metFORMIN (GLUCOPHAGE) 1000 MG tablet TAKE 1 TABLET TWICE DAILY WITH A MEAL  . Multiple Vitamin (MULTIVITAMIN WITH MINERALS) TABS tablet Take 1 tablet by mouth daily.  . [DISCONTINUED] atorvastatin (LIPITOR) 80 MG tablet TAKE 1 TABLET EVERY DAY AT 6:00PM  . [DISCONTINUED] FeFum-FePoly-FA-B Cmp-C-Biot (INTEGRA PLUS) CAPS Take 1 capsule by mouth daily.  . [DISCONTINUED] fenofibrate 160 MG tablet TAKE 1 TABLET EVERY DAY  . [DISCONTINUED] levothyroxine  (SYNTHROID) 100 MCG tablet TAKE 1 TABLET (100 MCG TOTAL) BY MOUTH DAILY BEFORE BREAKFAST.  . [DISCONTINUED] losartan-hydrochlorothiazide (HYZAAR) 100-25 MG tablet TAKE 1 TABLET EVERY DAY  . [DISCONTINUED] metFORMIN (GLUCOPHAGE) 1000 MG tablet TAKE 1 TABLET TWICE DAILY WITH A MEAL   No facility-administered encounter medications on file as of 07/26/2019.    Allergies  Allergen Reactions  . Niaspan [Niacin Er] Other (See Comments)    Increases blood sugars     Review of Systems  Constitutional: Negative for activity change, appetite change, chills, diaphoresis, fatigue, fever and unexpected weight change.  HENT: Negative.   Eyes: Negative.  Negative for photophobia and visual disturbance.  Respiratory: Negative for cough, chest tightness and shortness of breath.   Cardiovascular: Negative for chest pain, palpitations and leg swelling.  Gastrointestinal: Negative for abdominal pain, blood in stool, constipation, diarrhea, nausea and vomiting.  Endocrine: Negative.  Negative for cold intolerance, heat intolerance, polydipsia, polyphagia and polyuria.  Genitourinary: Negative for decreased urine volume, difficulty urinating, dysuria, frequency and urgency.  Musculoskeletal:  Negative for arthralgias and myalgias.  Skin: Negative.   Allergic/Immunologic: Negative.   Neurological: Negative for dizziness, tremors, seizures, syncope, facial asymmetry, speech difficulty, weakness, light-headedness, numbness and headaches.  Hematological: Negative.  Negative for adenopathy. Does not bruise/bleed easily.  Psychiatric/Behavioral: Negative for confusion, hallucinations, sleep disturbance and suicidal ideas.  All other systems reviewed and are negative.       Objective:  BP 126/74   Pulse 84   Temp 98.4 F (36.9 C)   Ht 5\' 10"  (1.778 m)   Wt 167 lb 6.4 oz (75.9 kg)   SpO2 100%   BMI 24.02 kg/m    Wt Readings from Last 3 Encounters:  07/26/19 167 lb 6.4 oz (75.9 kg)  06/01/19 165 lb (74.8  kg)  04/26/19 165 lb (74.8 kg)    Physical Exam Vitals and nursing note reviewed.  Constitutional:      General: He is not in acute distress.    Appearance: Normal appearance. He is well-developed, well-groomed and normal weight. He is not ill-appearing, toxic-appearing or diaphoretic.  HENT:     Head: Normocephalic and atraumatic.     Jaw: There is normal jaw occlusion.     Right Ear: Hearing normal.     Left Ear: Hearing normal.     Nose: Nose normal.     Mouth/Throat:     Lips: Pink.     Mouth: Mucous membranes are moist.     Pharynx: Oropharynx is clear. Uvula midline.  Eyes:     General: Lids are normal.     Extraocular Movements: Extraocular movements intact.     Conjunctiva/sclera: Conjunctivae normal.     Pupils: Pupils are equal, round, and reactive to light.  Neck:     Thyroid: No thyroid mass, thyromegaly or thyroid tenderness.     Vascular: No carotid bruit or JVD.     Trachea: Trachea and phonation normal.  Cardiovascular:     Rate and Rhythm: Normal rate and regular rhythm.     Chest Wall: PMI is not displaced.     Pulses: Normal pulses.          Dorsalis pedis pulses are 2+ on the right side and 2+ on the left side.       Posterior tibial pulses are 2+ on the right side and 2+ on the left side.     Heart sounds: Normal heart sounds. No murmur. No friction rub. No gallop.   Pulmonary:     Effort: Pulmonary effort is normal. No respiratory distress.     Breath sounds: Normal breath sounds. No wheezing.  Abdominal:     General: Bowel sounds are normal. There is no distension or abdominal bruit.     Palpations: Abdomen is soft. There is no hepatomegaly or splenomegaly.     Tenderness: There is no abdominal tenderness. There is no right CVA tenderness or left CVA tenderness.     Hernia: No hernia is present.  Musculoskeletal:        General: Normal range of motion.     Cervical back: Normal range of motion and neck supple.     Right lower leg: No edema.      Left lower leg: No edema.  Feet:     Right foot:     Protective Sensation: 10 sites tested. 10 sites sensed.     Skin integrity: Skin integrity normal.     Left foot:     Protective Sensation: 10 sites tested. 10 sites sensed.  Skin integrity: Skin integrity normal.  Lymphadenopathy:     Cervical: No cervical adenopathy.  Skin:    General: Skin is warm and dry.     Capillary Refill: Capillary refill takes less than 2 seconds.     Coloration: Skin is not cyanotic, jaundiced or pale.     Findings: No rash.  Neurological:     General: No focal deficit present.     Mental Status: He is alert and oriented to person, place, and time.     Cranial Nerves: Cranial nerves are intact. No cranial nerve deficit.     Sensory: Sensation is intact. No sensory deficit.     Motor: Motor function is intact. No weakness.     Coordination: Coordination is intact. Coordination normal.     Gait: Gait is intact. Gait normal.     Deep Tendon Reflexes: Reflexes are normal and symmetric. Reflexes normal.  Psychiatric:        Attention and Perception: Attention and perception normal.        Mood and Affect: Mood and affect normal.        Speech: Speech normal.        Behavior: Behavior normal. Behavior is cooperative.        Thought Content: Thought content normal.        Cognition and Memory: Cognition and memory normal.        Judgment: Judgment normal.     Results for orders placed or performed in visit on 07/17/19  Bayer DCA Hb A1c Waived  Result Value Ref Range   HB A1C (BAYER DCA - WAIVED) 7.0 (H) <7.0 %       Pertinent labs & imaging results that were available during my care of the patient were reviewed by me and considered in my medical decision making.  Assessment & Plan:  [place] was seen today for medical management of chronic issues and diabetes.  Diagnoses and all orders for this visit:  Type 2 diabetes mellitus without complication, without long-term current use of insulin  (HCC) A1C 7.0.  Diet and exercise encouraged. No changes in therapy. Continue below.  -     metFORMIN (GLUCOPHAGE) 1000 MG tablet; TAKE 1 TABLET TWICE DAILY WITH A MEAL -     Microalbumin / creatinine urine ratio  Hyperlipidemia associated with type 2 diabetes mellitus ([place]) Diet encouraged - increase intake of fresh fruits and vegetables, increase intake of lean proteins. Bake, broil, or grill foods. Avoid fried, greasy, and fatty foods. Avoid fast foods. Increase intake of fiber-rich whole grains. Exercise encouraged - at least 150 minutes per week and advance as tolerated.  Goal BMI < 25. Continue medications as prescribed. Follow up in 3-6 months as discussed.  -     fenofibrate 160 MG tablet; Take 1 tablet (160 mg total) by mouth daily. -     atorvastatin (LIPITOR) 80 MG tablet; TAKE 1 TABLET EVERY DAY AT 6:00PM  Vitamin D deficiency Labs pending. Continue repletion therapy. If indicated, will change repletion dosage. Eat foods rich in Vit D including milk, orange juice, yogurt with vitamin D added, salmon or mackerel, canned tuna fish, cereals with vitamin D added, and cod liver oil. Get out in the sun but make sure to wear at least SPF 30 sunscreen.   Hypertension associated with type 2 diabetes mellitus (HCC) BP well controlled. Changes were not made in regimen today. Goal BP is 130/80. Pt aware to report any persistent high or low readings. DASH diet and  exercise encouraged. Exercise at least 150 minutes per week and increase as tolerated. Goal BMI > 25. Stress management encouraged. Avoid nicotine and tobacco product use. Avoid excessive alcohol and NSAID's. Avoid more than 2000 mg of sodium daily. Medications as prescribed. Follow up as scheduled.  -     losartan-hydrochlorothiazide (HYZAAR) 100-25 MG tablet; Take 1 tablet by mouth daily. -     Microalbumin / creatinine urine ratio  Thrombocytosis (HCC) No abnormal bleeding or bruising. Recent platelet count, RBC, Hgb, and Hct normal.    Iron deficiency anemia secondary to inadequate dietary iron intake Doing well on current repletion therapy. Will continue.  -     FeFum-FePoly-FA-B Cmp-C-Biot (INTEGRA PLUS) CAPS; Take 1 capsule by mouth daily.  Acquired hypothyroidism Thyroid disease has been well controlled. Labs normal, no adjustments warranted. Make sure to take medications on an empty stomach with a full glass of water. Make sure to avoid vitamins or supplements for at least 4 hours before and 4 hours after taking medications. Repeat labs in 3 months if adjustments are made and in 6 months if stable.   -     levothyroxine (SYNTHROID) 100 MCG tablet; Take 1 tablet (100 mcg total) by mouth daily before breakfast.  Aortic atherosclerosis (HCC) Continue ASA and statin therapy.     Continue all other maintenance medications.  Follow up plan: Return in about 3 months (around 10/25/2019), or if symptoms worsen or fail to improve, for DM.   Continue healthy lifestyle choices, including diet (rich in fruits, vegetables, and lean proteins, and low in salt and simple carbohydrates) and exercise (at least 30 minutes of moderate physical activity daily).  Educational handout given for heart healthy diet.  The above assessment and management plan was discussed with the patient. The patient verbalized understanding of and has agreed to the management plan. Patient is aware to call the clinic if they develop any new symptoms or if symptoms persist or worsen. Patient is aware when to return to the clinic for a follow-up visit. Patient educated on when it is appropriate to go to the emergency department.   Robynn Pane, RN, FNP student  I personally was present during the history, physical exam, and medical decision-making activities of this service and have verified that the service and findings are accurately documented in the nurse practitioner student's note.  Monia Pouch, FNP-C [facility] [address] [place], [place] [address] [phone]

## 2019-07-26 NOTE — Addendum Note (Signed)
Addended by: Baruch Gouty on: 07/26/2019 08:57 AM   Modules accepted: Level of Service

## 2019-07-26 NOTE — Patient Instructions (Signed)

## 2019-08-03 ENCOUNTER — Telehealth: Payer: Self-pay | Admitting: Family Medicine

## 2019-08-03 ENCOUNTER — Other Ambulatory Visit: Payer: Self-pay

## 2019-08-03 DIAGNOSIS — E119 Type 2 diabetes mellitus without complications: Secondary | ICD-10-CM

## 2019-08-03 DIAGNOSIS — E1169 Type 2 diabetes mellitus with other specified complication: Secondary | ICD-10-CM

## 2019-08-03 DIAGNOSIS — D508 Other iron deficiency anemias: Secondary | ICD-10-CM

## 2019-08-03 DIAGNOSIS — E039 Hypothyroidism, unspecified: Secondary | ICD-10-CM

## 2019-08-03 DIAGNOSIS — E1159 Type 2 diabetes mellitus with other circulatory complications: Secondary | ICD-10-CM

## 2019-08-03 MED ORDER — METFORMIN HCL 1000 MG PO TABS: ORAL_TABLET | ORAL | 2 refills | Status: DC

## 2019-08-03 MED ORDER — ATORVASTATIN CALCIUM 80 MG PO TABS: ORAL_TABLET | ORAL | 2 refills | Status: DC

## 2019-08-03 MED ORDER — LEVOTHYROXINE SODIUM 100 MCG PO TABS: 100.0000 ug | ORAL_TABLET | Freq: Every day | ORAL | 3 refills | Status: DC

## 2019-08-03 MED ORDER — LOSARTAN POTASSIUM-HCTZ 100-25 MG PO TABS: 1.0000 | ORAL_TABLET | Freq: Every day | ORAL | 2 refills | Status: DC

## 2019-08-03 MED ORDER — INTEGRA PLUS PO CAPS: 1.0000 | ORAL_CAPSULE | Freq: Every day | ORAL | 4 refills | Status: DC

## 2019-08-03 MED ORDER — FENOFIBRATE 160 MG PO TABS: 160.0000 mg | ORAL_TABLET | Freq: Every day | ORAL | 2 refills | Status: DC

## 2019-08-03 NOTE — Telephone Encounter (Signed)
Refills resent to [facility] mail order

## 2019-08-14 DIAGNOSIS — Z23 Encounter for immunization: Secondary | ICD-10-CM | POA: Diagnosis not present

## 2019-09-11 DIAGNOSIS — Z23 Encounter for immunization: Secondary | ICD-10-CM | POA: Diagnosis not present

## 2019-10-25 ENCOUNTER — Ambulatory Visit (INDEPENDENT_AMBULATORY_CARE_PROVIDER_SITE_OTHER): Payer: Medicare Other | Admitting: Family Medicine

## 2019-10-25 ENCOUNTER — Encounter: Payer: Self-pay | Admitting: Family Medicine

## 2019-10-25 ENCOUNTER — Other Ambulatory Visit: Payer: Self-pay

## 2019-10-25 VITALS — BP 128/72 | HR 97 | Temp 97.6°F | Ht 70.0 in | Wt 170.6 lb

## 2019-10-25 DIAGNOSIS — I10 Essential (primary) hypertension: Secondary | ICD-10-CM | POA: Diagnosis not present

## 2019-10-25 DIAGNOSIS — E1169 Type 2 diabetes mellitus with other specified complication: Secondary | ICD-10-CM | POA: Diagnosis not present

## 2019-10-25 DIAGNOSIS — Z7689 Persons encountering health services in other specified circumstances: Secondary | ICD-10-CM

## 2019-10-25 DIAGNOSIS — E1159 Type 2 diabetes mellitus with other circulatory complications: Secondary | ICD-10-CM | POA: Diagnosis not present

## 2019-10-25 DIAGNOSIS — E039 Hypothyroidism, unspecified: Secondary | ICD-10-CM

## 2019-10-25 DIAGNOSIS — E785 Hyperlipidemia, unspecified: Secondary | ICD-10-CM | POA: Diagnosis not present

## 2019-10-25 DIAGNOSIS — I152 Hypertension secondary to endocrine disorders: Secondary | ICD-10-CM

## 2019-10-25 LAB — BAYER DCA HB A1C WAIVED: HB A1C (BAYER DCA - WAIVED): 7.3 % — ABNORMAL HIGH (ref <7.0)

## 2019-10-25 NOTE — Progress Notes (Signed)
Subjective: CC:est care, DM, HTN, HLD, Thyroid PCP: Janora Norlander, DO HPI:Matthew Daugherty is a 74 y.o. male presenting to clinic today for:  1. Type 2 Diabetes w/ HTN, HLD:  History: Onset about 35 years ago. No history of hospitalizations, DKA, diabetic foot ulcers or retinopathy.  Patient reports compliance with his atorvastatin, baby aspirin, Metformin 1000 mg twice daily and Hyzaar 100-25 mg daily.  He admits to not following a low-carb diet over the last several months.  He also has not been getting as much physical activity as he normally does.  No chest pain, shortness of breath, visual disturbance, edema.  Last eye exam: Up-to-date.  Sees Dr. Truman Hayward at [facility] Last foot exam: Up-to-date Last A1c:  Lab Results  Component Value Date   HGBA1C 7.0 (H) 07/17/2019   Nephropathy screen indicated?:  On ARB Last flu, zoster and/or pneumovax:  Immunization History  Administered Date(s) Administered   Fluad Quad(high Dose 65+) 01/17/2019   Influenza Whole 11/18/2008   Influenza, High Dose Seasonal PF 02/22/2015, 03/09/2016, 04/30/2017, 02/17/2018   Influenza,inj,Quad PF,6+ Mos 04/11/2013, 04/19/2014   Moderna SARS-COVID-2 Vaccination 08/14/2019, 09/11/2019   Pneumococcal Conjugate-13 04/11/2013   Pneumococcal Polysaccharide-23 04/19/2014   Td 01/17/2019   Tdap 06/18/2008   Zoster 04/30/2010    2. Hypothyroidism History: No history of surgery, radiation to the neck.  No family history of thyroid disorder  Patient reports compliance with Synthroid 100 mcg daily.  No tremor, heart palpitations, unplanned weight changes, changes in energy or bowel habits, no changes voice or difficulty swallowing.  ROS: Per HPI  Allergies  Allergen Reactions   Niaspan [Niacin Er] Other (See Comments)    Increases blood sugars    Past Medical History:  Diagnosis Date   Cardiac dysrhythmia, unspecified    Diabetes mellitus without complication ([place])    Essential hypertension,  benign    Hyperplasia of prostate    Other and unspecified hyperlipidemia    Thyroid disease    Unspecified transient cerebral ischemia     Current Outpatient Medications:    aspirin 81 MG EC tablet, Take 81 mg by mouth daily.  , Disp: , Rfl:    atorvastatin (LIPITOR) 80 MG tablet, TAKE 1 TABLET EVERY DAY AT 6:00PM, Disp: 90 tablet, Rfl: 2   Cholecalciferol (VITAMIN D-3) 5000 UNITS TABS, Take 1 tablet by mouth daily.  , Disp: , Rfl:    FeFum-FePoly-FA-B Cmp-C-Biot (INTEGRA PLUS) CAPS, Take 1 capsule by mouth daily., Disp: 90 capsule, Rfl: 4   fenofibrate 160 MG tablet, Take 1 tablet (160 mg total) by mouth daily., Disp: 90 tablet, Rfl: 2   levothyroxine (SYNTHROID) 100 MCG tablet, Take 1 tablet (100 mcg total) by mouth daily before breakfast., Disp: 90 tablet, Rfl: 3   losartan-hydrochlorothiazide (HYZAAR) 100-25 MG tablet, Take 1 tablet by mouth daily., Disp: 90 tablet, Rfl: 2   metFORMIN (GLUCOPHAGE) 1000 MG tablet, TAKE 1 TABLET TWICE DAILY WITH A MEAL, Disp: 180 tablet, Rfl: 2   Multiple Vitamin (MULTIVITAMIN WITH MINERALS) TABS tablet, Take 1 tablet by mouth daily., Disp: , Rfl:  Social History   Socioeconomic History   Marital status: Married    Spouse name: [name]   Number of children: 1   Years of education: Not on file   Highest education level: High school graduate  Occupational History   Occupation: retired    Fish farm manager: [employer]  Tobacco Use   Smoking status: Former Smoker    Packs/day: 1.00    Years: 44.00  Pack years: 44.00    Types: Cigarettes    Start date: 04/20/1954    Quit date: 06/13/1998    Years since quitting: 21.3   Smokeless tobacco: Never Used  Vaping Use   Vaping Use: Never used  Substance and Sexual Activity   Alcohol use: No   Drug use: No   Sexual activity: Yes  Other Topics Concern   Not on file  Social History Narrative   Patient lives with his wife in a 2 story home.  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  He has one son, and 2 grand daughters.   Social Determinants of Health   Financial Resource Strain: Low Risk    Difficulty of Paying Living Expenses: Not hard at all  Food Insecurity: No Food Insecurity   Worried About Charity fundraiser in the Last Year: Never true   [place] in the Last Year: Never true  Transportation Needs: No Transportation Needs   Lack of Transportation (Medical): No   Lack of Transportation (Non-Medical): No  Physical Activity: Insufficiently Active   Days of Exercise per Week: 2 days   Minutes of Exercise per Session: 60 min  Stress: No Stress Concern Present   Feeling of Stress : Not at all  Social Connections: Socially Integrated   Frequency of Communication with Friends and Family: More than three times a week   Frequency of Social Gatherings with Friends and Family: More than three times a week   Attends Religious Services: More than 4 times per year   Active Member of [employer] or Organizations: Yes   Attends Music therapist: More than 4 times per year   Marital Status: Married  Human resources officer Violence: Not At Risk   Fear of Current or Ex-Partner: No   Emotionally Abused: No   Physically Abused: No   Sexually Abused: No   Family History  Problem Relation Age of Onset   Cancer Mother        bone   Asthma Father    Heart attack Father        32    Objective: Office vital signs reviewed. BP 128/72    Pulse 97    Temp 97.6 F (36.4 C)    Ht 5\' 10"  (1.778 m)    Wt 170 lb 9.6 oz (77.4 kg)    SpO2 100%    BMI 24.48 kg/m   Physical Examination:  General: Awake, alert, well nourished, No acute distress HEENT: Normal, sclera white, MMM; no carotid bruits Cardio: regular rate and rhythm, S1S2 heard, no murmurs appreciated Pulm: clear to auscultation bilaterally, no wheezes, rhonchi or rales; normal work of breathing on room air Extremities: warm, well perfused, No  edema, cyanosis or clubbing; +2 pulses bilaterally MSK: normal gait and station Skin: dry; intact; no rashes or lesions; normal temperature Neuro: No tremor  Assessment/ Plan: 74 y.o. male   1. Type 2 diabetes mellitus with other specified complication, without long-term current use of insulin (HCC) Not controlled.  A1c up to 7.3 today.  We discussed very frankly how to modify diet so that we do not need to add additional medicine.  I will give him 3 months to make these corrections.  If persistently above 7, we will need to add additional medication, would consider Janumet - Bayer DCA Hb A1c Waived - Bayer [place] Hb A1c Waived; Future  2. Hypertension associated with type 2 diabetes mellitus (  [place]) Controlled.  Continue current regimen - Basic Metabolic Panel  3. Hyperlipidemia associated with type 2 diabetes mellitus ([place]) Continue statin  4. Establishing care with new doctor, encounter for  5. Acquired hypothyroidism Asymptomatic.  Check thyroid panel - Thyroid Panel With TSH   No orders of the defined types were placed in this encounter.  No orders of the defined types were placed in this encounter.    Janora Norlander, DO [place] [phone]

## 2019-10-25 NOTE — Patient Instructions (Addendum)
Sugar has gone up since last visit.  You are now UNCONTROLLED with A1c of 7.3.  I'm not changing your medications today.  I ask that you cut back on sweets/ breads/ pasta/ rice/ potatoes/ sugary drinks.  We will recheck in 3 months.  If still above 7.0, plan to add medication.  You had labs performed today.  You will be contacted with the results of the labs once they are available, usually in the next 3 business days for routine lab work.  If you have an active my chart account, they will be released to your MyChart.  If you prefer to have these labs released to you via telephone, please let us know.  If you had a pap smear or biopsy performed, expect to be contacted in about 7-10 days.

## 2019-10-26 LAB — BASIC METABOLIC PANEL
BUN/Creatinine Ratio: 14 (ref 10–24)
BUN: 18 mg/dL (ref 8–27)
CO2: 21 mmol/L (ref 20–29)
Calcium: 10.2 mg/dL (ref 8.6–10.2)
Chloride: 99 mmol/L (ref 96–106)
Creatinine, Ser: 1.3 mg/dL — ABNORMAL HIGH (ref 0.76–1.27)
GFR calc Af Amer: 63 mL/min/{1.73_m2} (ref >59)
GFR calc non Af Amer: 55 mL/min/{1.73_m2} — ABNORMAL LOW (ref >59)
Glucose: 138 mg/dL — ABNORMAL HIGH (ref 65–99)
Potassium: 5.2 mmol/L (ref 3.5–5.2)
Sodium: 139 mmol/L (ref 134–144)

## 2019-10-26 LAB — THYROID PANEL WITH TSH
Free Thyroxine Index: 3.2 (ref 1.2–4.9)
T3 Uptake Ratio: 31 % (ref 24–39)
T4, Total: 10.3 ug/dL (ref 4.5–12.0)
TSH: 2.75 u[IU]/mL (ref 0.450–4.500)

## 2019-11-08 ENCOUNTER — Ambulatory Visit (INDEPENDENT_AMBULATORY_CARE_PROVIDER_SITE_OTHER): Payer: Medicare Other | Admitting: *Deleted

## 2019-11-08 DIAGNOSIS — E1169 Type 2 diabetes mellitus with other specified complication: Secondary | ICD-10-CM | POA: Diagnosis not present

## 2019-11-08 DIAGNOSIS — E039 Hypothyroidism, unspecified: Secondary | ICD-10-CM

## 2019-11-08 DIAGNOSIS — E1159 Type 2 diabetes mellitus with other circulatory complications: Secondary | ICD-10-CM | POA: Diagnosis not present

## 2019-11-08 DIAGNOSIS — I152 Hypertension secondary to endocrine disorders: Secondary | ICD-10-CM

## 2019-11-08 DIAGNOSIS — I10 Essential (primary) hypertension: Secondary | ICD-10-CM | POA: Diagnosis not present

## 2019-11-08 NOTE — Patient Instructions (Signed)
Visit Information  Goals Addressed            This Visit's Progress   . Chronic Disease Management Needs       CARE PLAN ENTRY (see longtitudinal plan of care for additional care plan information)  Current Barriers: . Chronic Disease Management support, education, and care coordination needs related to HTN, DM, hypothyroidism, BPH  Clinical Goal(s) related to HTN, DM, hypothyroidism, BPH:  Over the next 90 days, patient will:  . Work with the care management team to address educational, disease management, and care coordination needs  . Begin or continue self health monitoring activities as directed today Measure and record cbg (blood glucose) 2 times daily and Measure and record blood pressure 3 times per week . Call provider office for new or worsened signs and symptoms Blood glucose findings outside established parameters and Blood pressure findings outside established parameters . Call care management team with questions or concerns . Verbalize basic understanding of patient centered plan of care established today  Interventions related to HTN, DM, hypothyroidism, BPH:  . Evaluation of current treatment plans and patient's adherence to plan as established by provider . Assessed patient understanding of disease states . Assessed patient's education and care coordination needs . Provided disease specific education to patient  . Collaborated with appropriate clinical care team members regarding patient needs . Chart reviewed . Discussed home blood pressure readings o Measuring every 2 to 3 days . Discussed home blood sugar readings o Measuring twice daily . Discussed diet o Doesn't follow a strict diabetic diet but does eat "healthy" and limits sugar and simple carbs . Encouraged patient to reach out to [facility] team as needed  Patient Self Care Activities related to HTN, DM, hypothyroidism, BPH:  . Patient is able to independently perform ADLs and IADLs  Initial goal  documentation        Mr. Treiber was given information about Chronic Care Management services today including:  1. CCM service includes personalized support from designated clinical staff supervised by his physician, including individualized plan of care and coordination with other care providers 2. 24/7 contact phone numbers for assistance for urgent and routine care needs. 3. Service will only be billed when office clinical staff spend 20 minutes or more in a month to coordinate care. 4. Only one practitioner may furnish and bill the service in a calendar month. 5. The patient may stop CCM services at any time (effective at the end of the month) by phone call to the office staff. 6. The patient will be responsible for cost sharing (co-pay) of up to 20% of the service fee (after annual deductible is met).  Patient agreed to services and verbal consent obtained.   Patient verbalizes understanding of instructions provided today.   The care management team will reach out to the patient again over the next 90 days.   Chong Sicilian, BSN, RN-BC Embedded Chronic Care Manager [facility] / [place] Management Direct Dial: [phone]

## 2019-11-08 NOTE — Chronic Care Management (AMB) (Signed)
Chronic Care Management   Initial Visit Note  11/08/2019 Name: Matthew Daugherty MRN: [id number] DOB: Apr 01, 1946  Referred by: Matthew Daugherty Reason for referral : Chronic Care Management (Initial Visit)   Matthew Daugherty is a 74 y.o. year old male who is a primary care patient of Matthew Daugherty. The CCM team was consulted for assistance with chronic disease management and care coordination needs related to HTN, DM, hypothyroidism, BPH.   Review of patient status, including review of consultants reports, relevant laboratory and other test results, and collaboration with appropriate care team members and the patient's provider was performed as part of comprehensive patient evaluation and provision of chronic care management services.    I spoke with Matthew Daugherty by telephone regarding management of his chronic medical conditions. He feels that things are well managed at this and he does not have any resource needs. He would like a follow-up call in about 3 months to reassess.   SDOH (Social Determinants of Health) assessments performed: Yes See Care Plan activities for detailed interventions related to SDOH     Medications: Outpatient Encounter Medications as of 11/08/2019  Medication Sig  . aspirin 81 MG EC tablet Take 81 mg by mouth daily.    Mar[place] Kitchen atorvastatin (LIPITOR) 80 MG tablet TAKE 1 TABLET EVERY DAY AT 6:00PM  . Cholecalciferol (VITAMIN D-3) 5000 UNITS TABS Take 1 tablet by mouth daily.    Marland Kitchen FeFum-FePoly-FA-B Cmp-C-Biot (INTEGRA PLUS) CAPS Take 1 capsule by mouth daily.  . fenofibrate 160 MG tablet Take 1 tablet (160 mg total) by mouth daily.  Marland Kitchen levothyroxine (SYNTHROID) 100 MCG tablet Take 1 tablet (100 mcg total) by mouth daily before breakfast.  . losartan-hydrochlorothiazide (HYZAAR) 100-25 MG tablet Take 1 tablet by mouth daily.  . metFORMIN (GLUCOPHAGE) 1000 MG tablet TAKE 1 TABLET TWICE DAILY WITH A MEAL  . Multiple Vitamin (MULTIVITAMIN WITH MINERALS) TABS tablet Take  1 tablet by mouth daily.   No facility-administered encounter medications on file as of 11/08/2019.    Lab Results  Component Value Date   HGBA1C 7.3 (H) 10/25/2019   HGBA1C 7.0 (H) 07/17/2019   HGBA1C 6.6 04/17/2019   Lab Results  Component Value Date   MICROALBUR 20 08/28/2014   LDLCALC 51 07/17/2019   CREATININE 1.30 (H) 10/25/2019     RN Care Plan           This Visit's Progress   . Chronic Disease Management Needs       CARE PLAN ENTRY (see longtitudinal plan of care for additional care plan information)  Current Barriers: . Chronic Disease Management support, education, and care coordination needs related to HTN, DM, hypothyroidism, BPH  Clinical Goal(s) related to HTN, DM, hypothyroidism, BPH:  Over the next 90 days, patient will:  . Work with the care management team to address educational, disease management, and care coordination needs  . Begin or continue self health monitoring activities as directed today Measure and record cbg (blood glucose) 2 times daily and Measure and record blood pressure 3 times per week . Call provider office for new or worsened signs and symptoms Blood glucose findings outside established parameters and Blood pressure findings outside established parameters . Call care management team with questions or concerns . Verbalize basic understanding of patient centered plan of care established today  Interventions related to HTN, DM, hypothyroidism, BPH:  . Evaluation of current treatment plans and patient's adherence to plan as established by provider . Assessed patient understanding  of disease states . Assessed patient's education and care coordination needs . Provided disease specific education to patient  . Collaborated with appropriate clinical care team members regarding patient needs . Chart reviewed . Discussed home blood pressure readings o Measuring every 2 to 3 days . Discussed home blood sugar readings o Measuring twice  daily . Discussed diet o Doesn't follow a strict diabetic diet but does eat "healthy" and limits sugar and simple carbs . Encouraged patient to reach out to [facility] team as needed  Patient Self Care Activities related to HTN, DM, hypothyroidism, BPH:  . Patient is able to independently perform ADLs and IADLs  Initial goal documentation       Plan:   The care management team will reach out to the patient again over the next 90 days.   Chong Sicilian, BSN, RN-BC Embedded Chronic Care Manager [facility] / [place] Management Direct Dial: [phone]

## 2020-01-23 ENCOUNTER — Other Ambulatory Visit: Payer: Self-pay

## 2020-01-23 ENCOUNTER — Other Ambulatory Visit: Payer: Medicare Other

## 2020-01-23 ENCOUNTER — Other Ambulatory Visit: Payer: Self-pay | Admitting: Family Medicine

## 2020-01-23 DIAGNOSIS — E1169 Type 2 diabetes mellitus with other specified complication: Secondary | ICD-10-CM

## 2020-01-23 DIAGNOSIS — I152 Hypertension secondary to endocrine disorders: Secondary | ICD-10-CM

## 2020-01-23 DIAGNOSIS — N400 Enlarged prostate without lower urinary tract symptoms: Secondary | ICD-10-CM

## 2020-01-23 DIAGNOSIS — E039 Hypothyroidism, unspecified: Secondary | ICD-10-CM

## 2020-01-23 DIAGNOSIS — E1159 Type 2 diabetes mellitus with other circulatory complications: Secondary | ICD-10-CM | POA: Diagnosis not present

## 2020-01-23 DIAGNOSIS — E785 Hyperlipidemia, unspecified: Secondary | ICD-10-CM | POA: Diagnosis not present

## 2020-01-23 LAB — BAYER DCA HB A1C WAIVED: HB A1C (BAYER DCA - WAIVED): 6.2 % (ref <7.0)

## 2020-01-24 LAB — CMP14+EGFR
ALT: 24 IU/L (ref 0–44)
AST: 23 IU/L (ref 0–40)
Albumin/Globulin Ratio: 2 (ref 1.2–2.2)
Albumin: 4.7 g/dL (ref 3.7–4.7)
Alkaline Phosphatase: 58 IU/L (ref 44–121)
BUN/Creatinine Ratio: 17 (ref 10–24)
BUN: 21 mg/dL (ref 8–27)
Bilirubin Total: 0.4 mg/dL (ref 0.0–1.2)
CO2: 23 mmol/L (ref 20–29)
Calcium: 9.8 mg/dL (ref 8.6–10.2)
Chloride: 98 mmol/L (ref 96–106)
Creatinine, Ser: 1.21 mg/dL (ref 0.76–1.27)
GFR calc Af Amer: 69 mL/min/{1.73_m2} (ref >59)
GFR calc non Af Amer: 60 mL/min/{1.73_m2} (ref >59)
Globulin, Total: 2.3 g/dL (ref 1.5–4.5)
Glucose: 129 mg/dL — ABNORMAL HIGH (ref 65–99)
Potassium: 4.5 mmol/L (ref 3.5–5.2)
Sodium: 135 mmol/L (ref 134–144)
Total Protein: 7 g/dL (ref 6.0–8.5)

## 2020-01-24 LAB — THYROID PANEL WITH TSH
Free Thyroxine Index: 3.1 (ref 1.2–4.9)
T3 Uptake Ratio: 28 % (ref 24–39)
T4, Total: 10.9 ug/dL (ref 4.5–12.0)
TSH: 1.86 u[IU]/mL (ref 0.450–4.500)

## 2020-01-24 LAB — PSA: Prostate Specific Ag, Serum: 1.4 ng/mL (ref 0.0–4.0)

## 2020-01-29 ENCOUNTER — Ambulatory Visit (INDEPENDENT_AMBULATORY_CARE_PROVIDER_SITE_OTHER): Payer: Medicare Other | Admitting: Family Medicine

## 2020-01-29 ENCOUNTER — Encounter: Payer: Self-pay | Admitting: Family Medicine

## 2020-01-29 ENCOUNTER — Other Ambulatory Visit: Payer: Self-pay

## 2020-01-29 VITALS — BP 132/67 | HR 78 | Temp 97.7°F | Ht 70.0 in | Wt 162.6 lb

## 2020-01-29 DIAGNOSIS — Z23 Encounter for immunization: Secondary | ICD-10-CM

## 2020-01-29 DIAGNOSIS — E785 Hyperlipidemia, unspecified: Secondary | ICD-10-CM

## 2020-01-29 DIAGNOSIS — I152 Hypertension secondary to endocrine disorders: Secondary | ICD-10-CM

## 2020-01-29 DIAGNOSIS — I700 Atherosclerosis of aorta: Secondary | ICD-10-CM

## 2020-01-29 DIAGNOSIS — E039 Hypothyroidism, unspecified: Secondary | ICD-10-CM | POA: Diagnosis not present

## 2020-01-29 DIAGNOSIS — E1169 Type 2 diabetes mellitus with other specified complication: Secondary | ICD-10-CM

## 2020-01-29 DIAGNOSIS — E1159 Type 2 diabetes mellitus with other circulatory complications: Secondary | ICD-10-CM | POA: Diagnosis not present

## 2020-01-29 MED ORDER — LOSARTAN POTASSIUM-HCTZ 100-25 MG PO TABS: 1.0000 | ORAL_TABLET | Freq: Every day | ORAL | 3 refills | Status: DC

## 2020-01-29 MED ORDER — LEVOTHYROXINE SODIUM 100 MCG PO TABS: 100.0000 ug | ORAL_TABLET | Freq: Every day | ORAL | 3 refills | Status: DC

## 2020-01-29 MED ORDER — METFORMIN HCL 1000 MG PO TABS: ORAL_TABLET | ORAL | 3 refills | Status: DC

## 2020-01-29 MED ORDER — FENOFIBRATE 160 MG PO TABS: 160.0000 mg | ORAL_TABLET | Freq: Every day | ORAL | 3 refills | Status: DC

## 2020-01-29 MED ORDER — ATORVASTATIN CALCIUM 80 MG PO TABS: ORAL_TABLET | ORAL | 3 refills | Status: DC

## 2020-01-29 NOTE — Progress Notes (Signed)
Subjective: CC: f/u DM, HTN, HLD, Thyroid PCP: Janora Norlander, DO HPI:Matthew Daugherty is a 74 y.o. male presenting to clinic today for:  1. Type 2 Diabetes w/ HTN, HLD:  History: Onset about 35 years ago. No history of hospitalizations, DKA, diabetic foot ulcers or retinopathy.  Patient reports compliance with his atorvastatin, baby aspirin, Metformin 1000 mg twice daily and Hyzaar 100-25 mg daily. At last visit A1c was not at goal.  Patient was to adjust lifestyle and has been successful with this.  His a1c has come down to 6.2.  He has been doing well.  No chest pain, shortness of breath, headache, blurred vision or edema.  Last eye exam: Sees Dr. Truman Hayward at [facility]. Had exam done 01/2020 Last foot exam: Up-to-date Last A1c:  Lab Results  Component Value Date   HGBA1C 6.2 01/23/2020   Nephropathy screen indicated?:  On ARB Last flu, zoster and/or pneumovax:  Immunization History  Administered Date(s) Administered  . Fluad Quad(high Dose 65+) 01/17/2019  . Influenza Whole 11/18/2008  . Influenza, High Dose Seasonal PF 02/22/2015, 03/09/2016, 04/30/2017, 02/17/2018  . Influenza,inj,Quad PF,6+ Mos 04/11/2013, 04/19/2014  . Moderna SARS-COVID-2 Vaccination 08/14/2019, 09/11/2019  . Pneumococcal Conjugate-13 04/11/2013  . Pneumococcal Polysaccharide-23 04/19/2014  . Td 01/17/2019  . Tdap 06/18/2008  . Zoster 04/30/2010    2. Hypothyroidism History: No history of surgery, radiation to the neck.  No family history of thyroid disorder  Patient reports compliance with Synthroid 100 mcg daily.  Thyroid panel showed normal levels 01/23/2020.  No tremor, change in voice, goiter.  ROS: Per HPI  Allergies  Allergen Reactions  . Niaspan [Niacin Er] Other (See Comments)    Increases blood sugars    Past Medical History:  Diagnosis Date  . Cardiac dysrhythmia, unspecified   . Diabetes mellitus without complication ([place])   . Essential hypertension, benign   . Hyperplasia of  prostate   . Other and unspecified hyperlipidemia   . Thyroid disease   . Unspecified transient cerebral ischemia     Current Outpatient Medications:  .  aspirin 81 MG EC tablet, Take 81 mg by mouth daily.  , Disp: , Rfl:  .  atorvastatin (LIPITOR) 80 MG tablet, TAKE 1 TABLET EVERY DAY AT 6:00PM, Disp: 90 tablet, Rfl: 2 .  Cholecalciferol (VITAMIN D-3) 5000 UNITS TABS, Take 1 tablet by mouth daily.  , Disp: , Rfl:  .  FeFum-FePoly-FA-B Cmp-C-Biot (INTEGRA PLUS) CAPS, Take 1 capsule by mouth daily., Disp: 90 capsule, Rfl: 4 .  fenofibrate 160 MG tablet, Take 1 tablet (160 mg total) by mouth daily., Disp: 90 tablet, Rfl: 2 .  levothyroxine (SYNTHROID) 100 MCG tablet, Take 1 tablet (100 mcg total) by mouth daily before breakfast., Disp: 90 tablet, Rfl: 3 .  losartan-hydrochlorothiazide (HYZAAR) 100-25 MG tablet, Take 1 tablet by mouth daily., Disp: 90 tablet, Rfl: 2 .  metFORMIN (GLUCOPHAGE) 1000 MG tablet, TAKE 1 TABLET TWICE DAILY WITH A MEAL, Disp: 180 tablet, Rfl: 2 .  Multiple Vitamin (MULTIVITAMIN WITH MINERALS) TABS tablet, Take 1 tablet by mouth daily., Disp: , Rfl:  Social History   Socioeconomic History  . Marital status: Married    Spouse name: [name]  . Number of children: 1  . Years of education: Not on file  . Highest education level: High school graduate  Occupational History  . Occupation: retired    Fish farm manager: [employer]  Tobacco Use  . Smoking status: Former Smoker    Packs/day: 1.00    Years:  44.00    Pack years: 44.00    Types: Cigarettes    Start date: 04/20/1954    Quit date: 06/13/1998    Years since quitting: 21.6  . Smokeless tobacco: Never Used  Vaping Use  . Vaping Use: Never used  Substance and Sexual Activity  . Alcohol use: No  . Drug use: No  . Sexual activity: Yes  Other Topics Concern  . Not on file  Social History Narrative   Patient lives with his wife in a 2 story home.  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  He has one son, and 2 grand daughters.   Social Determinants of Health   Financial Resource Strain: Low Risk   . Difficulty of Paying Living Expenses: Not hard at all  Food Insecurity: No Food Insecurity  . Worried About Charity fundraiser in the Last Year: Never true  . Ran Out of Food in the Last Year: Never true  Transportation Needs: No Transportation Needs  . Lack of Transportation (Medical): No  . Lack of Transportation (Non-Medical): No  Physical Activity: Sufficiently Active  . Days of Exercise per Week: 5 days  . Minutes of Exercise per Session: 60 min  Stress: No Stress Concern Present  . Feeling of Stress : Not at all  Social Connections: Socially Integrated  . Frequency of Communication with Friends and Family: More than three times a week  . Frequency of Social Gatherings with Friends and Family: More than three times a week  . Attends Religious Services: More than 4 times per year  . Active Member of Clubs or Organizations: Yes  . Attends Archivist Meetings: More than 4 times per year  . Marital Status: Married  Human resources officer Violence: Not At Risk  . Fear of Current or Ex-Partner: No  . Emotionally Abused: No  . Physically Abused: No  . Sexually Abused: No   Family History  Problem Relation Age of Onset  . Cancer Mother        bone  . Asthma Father   . Heart attack Father        25    Objective: Office vital signs reviewed. BP 132/67   Pulse 78   Temp 97.7 F (36.5 C)   Ht 5\' 10"  (1.778 m)   Wt 162 lb 9.6 oz (73.8 kg)   SpO2 100%   BMI 23.33 kg/m   Physical Examination:  General: Awake, alert, well nourished, No acute distress HEENT: Normal, sclera white, MMM; no carotid bruits.  No exophthalmos.  No goiter Cardio: regular rate and rhythm, S1S2 heard, no murmurs appreciated Pulm: clear to auscultation bilaterally, no wheezes, rhonchi or rales; normal work of breathing on room air Extremities: warm, well perfused, No  edema, cyanosis or clubbing; +2 pulses bilaterally MSK: normal gait and station Skin: dry; intact; no rashes or lesions; normal temperature Neuro: No tremor  Assessment/ Plan: 74 y.o. male   1. Type 2 diabetes mellitus with other specified complication, without long-term current use of insulin (HCC) Now under control with A1c of 6.2.  Continue current regimen.  Okay to follow-up in 4 months.  Release of information form completed for eye exam.  Influenza vaccination administered - metFORMIN (GLUCOPHAGE) 1000 MG tablet; TAKE 1 TABLET TWICE DAILY WITH A MEAL  Dispense: 180 tablet; Refill: 3  2. Hypertension associated with type 2 diabetes mellitus ([place]) Controlled.  Continue current regimen - losartan-hydrochlorothiazide (HYZAAR) 100-25 MG  tablet; Take 1 tablet by mouth daily.  Dispense: 90 tablet; Refill: 3  3. Hyperlipidemia associated with type 2 diabetes mellitus (HCC) Continue statin. - atorvastatin (LIPITOR) 80 MG tablet; TAKE 1 TABLET EVERY DAY AT 6:00PM  Dispense: 90 tablet; Refill: 3 - fenofibrate 160 MG tablet; Take 1 tablet (160 mg total) by mouth daily.  Dispense: 90 tablet; Refill: 3  4. Aortic atherosclerosis (HCC) - atorvastatin (LIPITOR) 80 MG tablet; TAKE 1 TABLET EVERY DAY AT 6:00PM  Dispense: 90 tablet; Refill: 3 - fenofibrate 160 MG tablet; Take 1 tablet (160 mg total) by mouth daily.  Dispense: 90 tablet; Refill: 3  5. Acquired hypothyroidism Asymptomatic. - levothyroxine (SYNTHROID) 100 MCG tablet; Take 1 tablet (100 mcg total) by mouth daily before breakfast.  Dispense: 90 tablet; Refill: 3   No orders of the defined types were placed in this encounter.  No orders of the defined types were placed in this encounter.  Patient's labs were reviewed with him today.  He is doing well.  May follow-up in 4 months.  Janora Norlander, DO [place] [phone]

## 2020-02-08 ENCOUNTER — Ambulatory Visit: Payer: Medicare Other | Admitting: *Deleted

## 2020-02-08 DIAGNOSIS — E1159 Type 2 diabetes mellitus with other circulatory complications: Secondary | ICD-10-CM

## 2020-02-08 DIAGNOSIS — E1169 Type 2 diabetes mellitus with other specified complication: Secondary | ICD-10-CM

## 2020-02-08 NOTE — Patient Instructions (Signed)
Plan:  The patient has been provided with contact information for the care management team and has been advised to call with any health related questions or concerns.  Next PCP appointment scheduled for: 05/31/20 with Dr Lajuana Ripple CCM enrollment status changed to "previously enrolled" as per patient request on 02/08/20 to discontinue enrollment. Case closed to case management services in primary care home.   Chong Sicilian, BSN, RN-BC Embedded Chronic Care Manager [facility] / [place] Management Direct Dial: [phone]

## 2020-02-08 NOTE — Chronic Care Management (AMB) (Signed)
  Chronic Care Management   Follow Up Note   02/08/2020 Name: Matthew Daugherty MRN: [id number] DOB: Aug 19, 1945  Referred by: Janora Norlander, DO Reason for referral : Chronic Care Management (RN follow up)   Matthew Daugherty is a 74 y.o. year old male who is a primary care patient of Janora Norlander, DO. The CCM team was consulted for assistance with chronic disease management and care coordination needs.    I spoke with [name] by telephone today. He does not have any CCM or resource needs and feels that his medical conditions are well controlled. He does not feel that participation in the CCM program is necessary at this time but appreciates that outreach and will reach out to [place] if he has any future needs.   Goals Addressed            This Visit's Progress   . COMPLETED: Chronic Disease Management Needs       CARE PLAN ENTRY (see longtitudinal plan of care for additional care plan information)  Current Barriers: . Chronic Disease Management support, education, and care coordination needs related to HTN, DM, hypothyroidism, BPH  Clinical Goal(s) related to HTN, DM, hypothyroidism, BPH:  Over the next 90 days, patient will:  . Work with the care management team to address educational, disease management, and care coordination needs  . Begin or continue self health monitoring activities as directed today Measure and record cbg (blood glucose) 2 times daily and Measure and record blood pressure 3 times per week . Call provider office for new or worsened signs and symptoms Blood glucose findings outside established parameters and Blood pressure findings outside established parameters . Call care management team with questions or concerns . Verbalize basic understanding of patient centered plan of care established today  Interventions related to HTN, DM, hypothyroidism, BPH:  . Evaluation of current treatment plans and patient's adherence to plan as established by  provider . Assessed patient understanding of disease states . Assessed patient's education and care coordination needs . Provided disease specific education to patient  . Collaborated with appropriate clinical care team members regarding patient needs . Chart reviewed . Discussed home blood pressure readings o Measuring every 2 to 3 days . Discussed home blood sugar readings o Measuring twice daily . Discussed diet o Doesn't follow a strict diabetic diet but does eat "healthy" and limits sugar and simple carbs . Encouraged patient to reach out to [facility] team as needed  Patient Self Care Activities related to HTN, DM, hypothyroidism, BPH:  . Patient is able to independently perform ADLs and IADLs  Initial goal documentation         Plan:  The patient has been provided with contact information for the care management team and has been advised to call with any health related questions or concerns.  Next PCP appointment scheduled for: 05/31/20 with Dr Lajuana Ripple CCM enrollment status changed to "previously enrolled" as per patient request on 02/08/20 to discontinue enrollment. Case closed to case management services in primary care home.   Chong Sicilian, BSN, RN-BC Embedded Chronic Care Manager [facility] / [place] Management Direct Dial: [phone]

## 2020-05-21 ENCOUNTER — Other Ambulatory Visit: Payer: Self-pay

## 2020-05-21 ENCOUNTER — Other Ambulatory Visit: Payer: Medicare Other

## 2020-05-21 DIAGNOSIS — E039 Hypothyroidism, unspecified: Secondary | ICD-10-CM

## 2020-05-21 DIAGNOSIS — E1169 Type 2 diabetes mellitus with other specified complication: Secondary | ICD-10-CM

## 2020-05-21 DIAGNOSIS — I152 Hypertension secondary to endocrine disorders: Secondary | ICD-10-CM | POA: Diagnosis not present

## 2020-05-21 DIAGNOSIS — E785 Hyperlipidemia, unspecified: Secondary | ICD-10-CM

## 2020-05-21 DIAGNOSIS — E1159 Type 2 diabetes mellitus with other circulatory complications: Secondary | ICD-10-CM

## 2020-05-21 LAB — BAYER DCA HB A1C WAIVED: HB A1C (BAYER DCA - WAIVED): 6.8 % (ref <7.0)

## 2020-05-22 LAB — LIPID PANEL
Chol/HDL Ratio: 2.5 ratio (ref 0.0–5.0)
Cholesterol, Total: 102 mg/dL (ref 100–199)
HDL: 41 mg/dL (ref >39)
LDL Chol Calc (NIH): 45 mg/dL (ref 0–99)
Triglycerides: 78 mg/dL (ref 0–149)
VLDL Cholesterol Cal: 16 mg/dL (ref 5–40)

## 2020-05-22 LAB — CMP14+EGFR
ALT: 20 IU/L (ref 0–44)
AST: 25 IU/L (ref 0–40)
Albumin/Globulin Ratio: 2.2 (ref 1.2–2.2)
Albumin: 4.8 g/dL — ABNORMAL HIGH (ref 3.7–4.7)
Alkaline Phosphatase: 66 IU/L (ref 44–121)
BUN/Creatinine Ratio: 13 (ref 10–24)
BUN: 16 mg/dL (ref 8–27)
Bilirubin Total: 0.5 mg/dL (ref 0.0–1.2)
CO2: 23 mmol/L (ref 20–29)
Calcium: 10.1 mg/dL (ref 8.6–10.2)
Chloride: 98 mmol/L (ref 96–106)
Creatinine, Ser: 1.19 mg/dL (ref 0.76–1.27)
GFR calc Af Amer: 70 mL/min/{1.73_m2} (ref >59)
GFR calc non Af Amer: 61 mL/min/{1.73_m2} (ref >59)
Globulin, Total: 2.2 g/dL (ref 1.5–4.5)
Glucose: 120 mg/dL — ABNORMAL HIGH (ref 65–99)
Potassium: 4.4 mmol/L (ref 3.5–5.2)
Sodium: 136 mmol/L (ref 134–144)
Total Protein: 7 g/dL (ref 6.0–8.5)

## 2020-05-22 LAB — CBC WITH DIFFERENTIAL/PLATELET
Basophils Absolute: 0 10*3/uL (ref 0.0–0.2)
Basos: 1 %
EOS (ABSOLUTE): 0.1 10*3/uL (ref 0.0–0.4)
Eos: 1 %
Hematocrit: 39.7 % (ref 37.5–51.0)
Hemoglobin: 13.2 g/dL (ref 13.0–17.7)
Immature Grans (Abs): 0 10*3/uL (ref 0.0–0.1)
Immature Granulocytes: 0 %
Lymphocytes Absolute: 2.1 10*3/uL (ref 0.7–3.1)
Lymphs: 35 %
MCH: 30.5 pg (ref 26.6–33.0)
MCHC: 33.2 g/dL (ref 31.5–35.7)
MCV: 92 fL (ref 79–97)
Monocytes Absolute: 0.5 10*3/uL (ref 0.1–0.9)
Monocytes: 8 %
Neutrophils Absolute: 3.3 10*3/uL (ref 1.4–7.0)
Neutrophils: 55 %
Platelets: 371 10*3/uL (ref 150–450)
RBC: 4.33 x10E6/uL (ref 4.14–5.80)
RDW: 12.2 % (ref 11.6–15.4)
WBC: 6 10*3/uL (ref 3.4–10.8)

## 2020-05-22 LAB — THYROID PANEL WITH TSH
Free Thyroxine Index: 3.2 (ref 1.2–4.9)
T3 Uptake Ratio: 28 % (ref 24–39)
T4, Total: 11.5 ug/dL (ref 4.5–12.0)
TSH: 1.91 u[IU]/mL (ref 0.450–4.500)

## 2020-05-28 DIAGNOSIS — Z23 Encounter for immunization: Secondary | ICD-10-CM | POA: Diagnosis not present

## 2020-05-31 ENCOUNTER — Other Ambulatory Visit: Payer: Self-pay

## 2020-05-31 ENCOUNTER — Ambulatory Visit (INDEPENDENT_AMBULATORY_CARE_PROVIDER_SITE_OTHER): Payer: Medicare Other | Admitting: Family Medicine

## 2020-05-31 VITALS — BP 120/61 | HR 75 | Temp 98.0°F | Ht 70.0 in | Wt 161.0 lb

## 2020-05-31 DIAGNOSIS — I700 Atherosclerosis of aorta: Secondary | ICD-10-CM | POA: Diagnosis not present

## 2020-05-31 DIAGNOSIS — I152 Hypertension secondary to endocrine disorders: Secondary | ICD-10-CM | POA: Diagnosis not present

## 2020-05-31 DIAGNOSIS — E039 Hypothyroidism, unspecified: Secondary | ICD-10-CM | POA: Diagnosis not present

## 2020-05-31 DIAGNOSIS — E1159 Type 2 diabetes mellitus with other circulatory complications: Secondary | ICD-10-CM

## 2020-05-31 DIAGNOSIS — E1169 Type 2 diabetes mellitus with other specified complication: Secondary | ICD-10-CM | POA: Diagnosis not present

## 2020-05-31 DIAGNOSIS — E785 Hyperlipidemia, unspecified: Secondary | ICD-10-CM

## 2020-05-31 NOTE — Progress Notes (Signed)
Subjective: CC: DM PCP: Janora Norlander, DO HPI:Matthew Daugherty is a 75 y.o. male presenting to clinic today for:  1. Type 2 Diabetes with hypertension, hyperlipidemia:  Patient reports compliance with his Metformin, Hyzaar, Lipitor and aspirin.  No chest pain, shortness of breath, edema, visual disturbance or falls.  Last eye exam: 10/16 w Dr Marin Comment Last foot exam: UTD Last A1c:  Lab Results  Component Value Date   HGBA1C 6.8 05/21/2020   Nephropathy screen indicated?: on ARB Last flu, zoster and/or pneumovax:  Immunization History  Administered Date(s) Administered  . Fluad Quad(high Dose 65+) 01/17/2019, 01/29/2020  . Influenza Whole 11/18/2008  . Influenza, High Dose Seasonal PF 02/22/2015, 03/09/2016, 04/30/2017, 02/17/2018  . Influenza,inj,Quad PF,6+ Mos 04/11/2013, 04/19/2014  . Moderna Sars-Covid-2 Vaccination 08/14/2019, 09/11/2019, 05/28/2020  . Pneumococcal Conjugate-13 04/11/2013  . Pneumococcal Polysaccharide-23 04/19/2014  . Td 01/17/2019  . Tdap 06/18/2008  . Zoster 04/30/2010    2.  Hypothyroidism Patient is compliant with Synthroid 100 mcg daily.  No reports of tremor, heart palpitations, change in bowel habit or weight.  No change in voice or difficulty swallowing.  ROS: Per HPI  Allergies  Allergen Reactions  . Niaspan [Niacin Er] Other (See Comments)    Increases blood sugars    Past Medical History:  Diagnosis Date  . Cardiac dysrhythmia, unspecified   . Diabetes mellitus without complication ([place])   . Essential hypertension, benign   . Hyperplasia of prostate   . Other and unspecified hyperlipidemia   . Thyroid disease   . Unspecified transient cerebral ischemia     Current Outpatient Medications:  .  aspirin 81 MG EC tablet, Take 81 mg by mouth daily., Disp: , Rfl:  .  atorvastatin (LIPITOR) 80 MG tablet, TAKE 1 TABLET EVERY DAY AT 6:00PM, Disp: 90 tablet, Rfl: 3 .  Cholecalciferol (VITAMIN D-3) 5000 UNITS TABS, Take 1 tablet by mouth  daily., Disp: , Rfl:  .  FeFum-FePoly-FA-B Cmp-C-Biot (INTEGRA PLUS) CAPS, Take 1 capsule by mouth daily., Disp: 90 capsule, Rfl: 4 .  fenofibrate 160 MG tablet, Take 1 tablet (160 mg total) by mouth daily., Disp: 90 tablet, Rfl: 3 .  levothyroxine (SYNTHROID) 100 MCG tablet, Take 1 tablet (100 mcg total) by mouth daily before breakfast., Disp: 90 tablet, Rfl: 3 .  losartan-hydrochlorothiazide (HYZAAR) 100-25 MG tablet, Take 1 tablet by mouth daily., Disp: 90 tablet, Rfl: 3 .  metFORMIN (GLUCOPHAGE) 1000 MG tablet, TAKE 1 TABLET TWICE DAILY WITH A MEAL, Disp: 180 tablet, Rfl: 3 .  Multiple Vitamin (MULTIVITAMIN WITH MINERALS) TABS tablet, Take 1 tablet by mouth daily., Disp: , Rfl:  Social History   Socioeconomic History  . Marital status: Married    Spouse name: [name]  . Number of children: 1  . Years of education: Not on file  . Highest education level: High school graduate  Occupational History  . Occupation: retired    Fish farm manager: [employer]  Tobacco Use  . Smoking status: Former Smoker    Packs/day: 1.00    Years: 44.00    Pack years: 44.00    Types: Cigarettes    Start date: 04/20/1954    Quit date: 06/13/1998    Years since quitting: 21.9  . Smokeless tobacco: Never Used  Vaping Use  . Vaping Use: Never used  Substance and Sexual Activity  . Alcohol use: No  . Drug use: No  . Sexual activity: Yes  Other Topics Concern  . Not on file  Social History Narrative  Patient lives with his wife in a 2 story home.  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  He has one son, and 2 grand daughters.   Social Determinants of Health   Financial Resource Strain: Low Risk   . Difficulty of Paying Living Expenses: Not hard at all  Food Insecurity: No Food Insecurity  . Worried About Charity fundraiser in the Last Year: Never true  . Ran Out of Food in the Last Year: Never true  Transportation Needs: No Transportation Needs  . Lack of  Transportation (Medical): No  . Lack of Transportation (Non-Medical): No  Physical Activity: Sufficiently Active  . Days of Exercise per Week: 5 days  . Minutes of Exercise per Session: 60 min  Stress: No Stress Concern Present  . Feeling of Stress : Not at all  Social Connections: Socially Integrated  . Frequency of Communication with Friends and Family: More than three times a week  . Frequency of Social Gatherings with Friends and Family: More than three times a week  . Attends Religious Services: More than 4 times per year  . Active Member of Clubs or Organizations: Yes  . Attends Archivist Meetings: More than 4 times per year  . Marital Status: Married  Human resources officer Violence: Not At Risk  . Fear of Current or Ex-Partner: No  . Emotionally Abused: No  . Physically Abused: No  . Sexually Abused: No   Family History  Problem Relation Age of Onset  . Cancer Mother        bone  . Asthma Father   . Heart attack Father        50    Objective: Office vital signs reviewed. BP 120/61   Pulse 75   Temp 98 F (36.7 C) (Temporal)   Ht '5\' 10"'  (1.778 m)   Wt 161 lb (73 kg)   SpO2 100%   BMI 23.10 kg/m   Physical Examination:  General: Awake, alert, well nourished, No acute distress HEENT: Normal; sclera white.  No carotid bruits.  Moist mucous membranes; no exophthalmos.  No goiter Cardio: regular rate and rhythm, S1S2 heard, no murmurs appreciated Pulm: clear to auscultation bilaterally, no wheezes, rhonchi or rales; normal work of breathing on room air GI: soft, non-tender, non-distended, bowel sounds present x4, no hepatomegaly, no splenomegaly, no masses Extremities: warm, well perfused, No edema, cyanosis or clubbing; +2 pulses bilaterally MSK: normal gait and station Skin: dry; intact; no rashes or lesions; normal temperature Neuro: No tremor  Assessment/ Plan: 75 y.o. male   Type 2 diabetes mellitus with other specified complication, without  long-term current use of insulin ([place]) - Plan: Bayer DCA Hb A1c Waived, CMP14+EGFR  Hypertension associated with type 2 diabetes mellitus ([place]) - Plan: CMP14+EGFR  Hyperlipidemia associated with type 2 diabetes mellitus ([place]) - Plan: CMP14+EGFR  Aortic atherosclerosis ([place]) - Plan: CMP14+EGFR  Acquired hypothyroidism - Plan: Thyroid Panel With TSH  Blood sugar under excellent control.  Okay to follow-up in 6 months.    Blood pressure controlled.  Continue current regimen  Continue statin  Continue sugar, blood pressure and cholesterol control given history of aortic atherosclerosis  Thyroid panel normal.  He is asymptomatic.  Future orders for all the above placed in anticipation of our next visit  Orders Placed This Encounter  Procedures  . Bayer DCA Hb A1c Waived    Standing Status:   Standing    Number of Occurrences:  4    Standing Expiration Date:   05/31/2021  . CMP14+EGFR    Standing Status:   Standing    Number of Occurrences:   2    Standing Expiration Date:   05/31/2021  . Thyroid Panel With TSH    Standing Status:   Standing    Number of Occurrences:   2    Standing Expiration Date:   05/31/2021   No orders of the defined types were placed in this encounter.    Janora Norlander, DO [place] [phone]

## 2020-06-04 ENCOUNTER — Ambulatory Visit (INDEPENDENT_AMBULATORY_CARE_PROVIDER_SITE_OTHER): Payer: Medicare Other | Admitting: *Deleted

## 2020-06-04 VITALS — BP 120/64 | Ht 70.0 in | Wt 161.0 lb

## 2020-06-04 DIAGNOSIS — Z0000 Encounter for general adult medical examination without abnormal findings: Secondary | ICD-10-CM | POA: Diagnosis not present

## 2020-06-04 NOTE — Progress Notes (Signed)
MEDICARE ANNUAL WELLNESS VISIT  06/04/2020  Telephone Visit Disclaimer This Medicare AWV was conducted by telephone due to national recommendations for restrictions regarding the COVID-19 Pandemic (e.g. social distancing).  I verified, using two identifiers, that I am speaking with Matthew Daugherty or their authorized healthcare agent. I discussed the limitations, risks, security, and privacy concerns of performing an evaluation and management service by telephone and the potential availability of an in-person appointment in the future. The patient expressed understanding and agreed to proceed.  Location of Patient: in his home  Location of Provider (nurse):  In office  Subjective:    Matthew Daugherty is a 75 y.o. male patient of Matthew Norlander, DO who had a Medicare Annual Wellness Visit today via telephone. Matthew Daugherty is Retired and lives with their spouse. he has 1 child. he reports that he is socially active and does interact with friends/family regularly. he is minimally physically active and enjoys gardening.  Patient Care Team: Matthew Norlander, DO as PCP - General (Family Medicine) Clarene Essex, MD (Gastroenterology) Minus Breeding, MD as Consulting Physician (Cardiology)  Advanced Directives 06/04/2020 06/01/2019 05/30/2018 05/25/2017 02/22/2017 01/26/2017 12/27/2015  Does Patient Have a Medical Advance Directive? No Yes Yes No Yes Yes Yes  Type of Advance Directive - [place];Living will Healthcare Power of [place]  Does patient want to make changes to medical advance directive? No - Patient declined No - Patient declined No - Patient declined - No - Patient declined No - Patient declined No - Patient declined  Copy of [place] in Chart? - No - copy requested No - copy requested - No - copy requested No - copy requested No - copy requested  Would patient like  information on creating a medical advance directive? No - Patient declined - - Yes (MAU/Ambulatory/Procedural Areas - Information given) - - [facility] Utilization Over the Past 12 Months: # of hospitalizations or ER visits: 0 # of surgeries: 0  Review of Systems    Patient reports that his overall health is better compared to last year.  General ROS: negative  Patient Reported Readings (BP, Pulse, CBG, Weight, etc) BP 120/64   Ht 5\' 10"  (1.778 m)   Wt 161 lb (73 kg)   BMI 23.10 kg/m    Pain Assessment       Current Medications & Allergies (verified) Allergies as of 06/04/2020      Reactions   Niaspan [niacin Er] Other (See Comments)   Increases blood sugars      Medication List       Accurate as of June 04, 2020  8:19 AM. If you have any questions, ask your nurse or doctor.        aspirin 81 MG EC tablet Take 81 mg by mouth daily.   atorvastatin 80 MG tablet Commonly known as: LIPITOR TAKE 1 TABLET EVERY DAY AT 6:00PM   fenofibrate 160 MG tablet Take 1 tablet (160 mg total) by mouth daily.   Integra Plus Caps Take 1 capsule by mouth daily.   levothyroxine 100 MCG tablet Commonly known as: SYNTHROID Take 1 tablet (100 mcg total) by mouth daily before breakfast.   losartan-hydrochlorothiazide 100-25 MG tablet Commonly known as: HYZAAR Take 1 tablet by mouth daily.   metFORMIN 1000 MG tablet Commonly known as: GLUCOPHAGE TAKE 1 TABLET TWICE DAILY WITH A MEAL  multivitamin with minerals Tabs tablet Take 1 tablet by mouth daily.   Vitamin D-3 125 MCG (5000 UT) Tabs Take 1 tablet by mouth daily.       History (reviewed): Past Medical History:  Diagnosis Date  . Cardiac dysrhythmia, unspecified   . Diabetes mellitus without complication ([place])   . Essential hypertension, benign   . Hyperplasia of prostate   . Other and unspecified hyperlipidemia   . Thyroid disease   . Unspecified transient cerebral ischemia    Past Surgical History:   Procedure Laterality Date  . APPENDECTOMY    . CHOLECYSTECTOMY    . LAPAROSCOPIC CHOLECYSTECTOMY SINGLE SITE WITH INTRAOPERATIVE CHOLANGIOGRAM N/A 06/22/2015   Procedure: LAPAROSCOPIC CHOLECYSTECTOMY SINGLE SITE WITH INTRAOPERATIVE CHOLANGIOGRAM, PRIMARY UMBILICAL HERNIA REPAIR;  Surgeon: Michael Boston, MD;  Location: [facility];  Service: General;  Laterality: N/A;   Family History  Problem Relation Age of Onset  . Cancer Mother        bone  . Asthma Father   . Heart attack Father        87   Social History   Socioeconomic History  . Marital status: Married    Spouse name: [name]  . Number of children: 1  . Years of education: Not on file  . Highest education level: High school graduate  Occupational History  . Occupation: retired    Fish farm manager: [employer]  Tobacco Use  . Smoking status: Former Smoker    Packs/day: 1.00    Years: 44.00    Pack years: 44.00    Types: Cigarettes    Start date: 04/20/1954    Quit date: 06/13/1998    Years since quitting: 21.9  . Smokeless tobacco: Never Used  Vaping Use  . Vaping Use: Never used  Substance and Sexual Activity  . Alcohol use: No  . Drug use: No  . Sexual activity: Yes  Other Topics Concern  . Not on file  Social History Narrative   Patient lives with his wife in a 2 story home.  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  He has one son, and 2 grand daughters.   Social Determinants of Health   Financial Resource Strain: Low Risk   . Difficulty of Paying Living Expenses: Not hard at all  Food Insecurity: No Food Insecurity  . Worried About Charity fundraiser in the Last Year: Never true  . Ran Out of Food in the Last Year: Never true  Transportation Needs: Not on file  Physical Activity: Sufficiently Active  . Days of Exercise per Week: 5 days  . Minutes of Exercise per Session: 60 min  Stress: Not on file  Social Connections: Socially Integrated  . Frequency of Communication  with Friends and Family: More than three times a week  . Frequency of Social Gatherings with Friends and Family: More than three times a week  . Attends Religious Services: More than 4 times per year  . Active Member of Clubs or Organizations: Yes  . Attends Archivist Meetings: More than 4 times per year  . Marital Status: Married    Activities of Daily Living In your present state of health, do you have any difficulty performing the following activities: 06/04/2020 11/08/2019  Hearing? N N  Vision? Y N  Comment wears RX glasses -  Difficulty concentrating or making decisions? N N  Walking or climbing stairs? N N  Dressing or bathing? N N  Doing errands, shopping?  N N  Preparing Food and eating ? N N  Using the Toilet? N N  In the past six months, have you accidently leaked urine? N N  Do you have problems with loss of bowel control? N N  Managing your Medications? N N  Managing your Finances? N N  Housekeeping or managing your Housekeeping? N N  Some recent data might be hidden    Patient Education/ Literacy    Exercise Current Exercise Habits: Home exercise routine, Type of exercise: walking, Time (Minutes): 40, Frequency (Times/Week): 3, Weekly Exercise (Minutes/Week): 120, Intensity: Mild, Exercise limited by: None identified  Diet Patient reports consuming 2 meals a day and 1 snack(s) a day Patient reports that his primary diet is: Regular Patient reports that she does have regular access to food.   Depression Screen PHQ 2/9 Scores 06/04/2020 05/31/2020 01/29/2020 10/25/2019 07/26/2019 04/26/2019 01/17/2019  PHQ - 2 Score 0 0 0 0 0 0 0  PHQ- 9 Score - 0 - - - - -     Fall Risk Fall Risk  06/04/2020 05/31/2020 01/29/2020 10/25/2019 07/26/2019  Falls in the past year? 0 0 0 0 0  Number falls in past yr: - - - - -  Injury with Fall? - - - - -  Risk for fall due to : - - - - -  Follow up - - - - -     Objective:  Shia D Dains seemed alert and oriented and he  participated appropriately during our telephone visit.  Blood Pressure Weight BMI  BP Readings from Last 3 Encounters:  06/04/20 120/64  05/31/20 120/61  01/29/20 132/67   Wt Readings from Last 3 Encounters:  06/04/20 161 lb (73 kg)  05/31/20 161 lb (73 kg)  01/29/20 162 lb 9.6 oz (73.8 kg)   BMI Readings from Last 1 Encounters:  06/04/20 23.10 kg/m    *Unable to obtain current vital signs, weight, and BMI due to telephone visit type  Hearing/Vision  . Kristofor did not seem to have difficulty with hearing/understanding during the telephone conversation . Reports that he has had a formal eye exam by an eye care professional within the past year . Reports that he has not had a formal hearing evaluation within the past year *Unable to fully assess hearing and vision during telephone visit type  Cognitive Function: 6CIT Screen 06/04/2020 06/01/2019  What Year? 0 points 0 points  What month? 0 points 0 points  What time? 0 points 0 points  Count back from 20 0 points 0 points  Months in reverse 0 points 0 points  Repeat phrase 0 points 0 points  Total Score 0 0   (Normal:0-7, Significant for Dysfunction: >8)  Normal Cognitive Function Screening: Yes   Immunization & Health Maintenance Record Immunization History  Administered Date(s) Administered  . Fluad Quad(high Dose 65+) 01/17/2019, 01/29/2020  . Influenza Whole 11/18/2008  . Influenza, High Dose Seasonal PF 02/22/2015, 03/09/2016, 04/30/2017, 02/17/2018  . Influenza,inj,Quad PF,6+ Mos 04/11/2013, 04/19/2014  . Moderna Sars-Covid-2 Vaccination 08/14/2019, 09/11/2019, 05/28/2020  . Pneumococcal Conjugate-13 04/11/2013  . Pneumococcal Polysaccharide-23 04/19/2014  . Td 01/17/2019  . Tdap 06/18/2008  . Zoster 04/30/2010    Health Maintenance  Topic Date Due  . Hepatitis C Screening  07/25/2022 (Originally Oct 10, 1945)  . FOOT EXAM  07/25/2020  . HEMOGLOBIN A1C  11/18/2020  . COVID-19 Vaccine (4 - Booster for Moderna  series) 11/25/2020  . OPHTHALMOLOGY EXAM  02/02/2021  . COLONOSCOPY (Pts 45-11yrs Insurance coverage  will need to be confirmed)  02/22/2024  . TETANUS/TDAP  01/16/2029  . INFLUENZA VACCINE  Completed  . PNA vac Low Risk Adult  Completed  . COLON CANCER SCREENING ANNUAL FOBT  Discontinued       Assessment  This is a routine wellness examination for IAAN OREGEL.  Health Maintenance: Due or Overdue There are no preventive care reminders to display for this patient.  Matthew Daugherty does not need a referral for Community Assistance: Care Management:   no Social Work:    no Prescription Assistance:  no Nutrition/Diabetes Education:  no   Plan:  Personalized Goals Goals Addressed            This Visit's Progress   . Exercise 3x per week (30 min per time)   On track    Walk for 30 minutes at least 3 times weekly    . Increase physical activity   On track   . Prevent falls   On track     Personalized Health Maintenance & Screening Recommendations  up to date / advanced directives discussed   Lung Cancer Screening Recommended: no (Low Dose CT Chest recommended if Age 71-80 years, 30 pack-year currently smoking OR have quit w/in past 15 years) Hepatitis C Screening recommended: no HIV Screening recommended: no  Advanced Directives: Written information was not prepared per patient's request.  Referrals & Orders No orders of the defined types were placed in this encounter.   Follow-up Plan . Follow-up with Matthew Norlander, DO as planned    I have personally reviewed and noted the following in the patient's chart:   . Medical and social history . Use of alcohol, tobacco or illicit drugs  . Current medications and supplements . Functional ability and status . Nutritional status . Physical activity . Advanced directives . List of other physicians . Hospitalizations, surgeries, and ER visits in previous 12 months . Vitals . Screenings to include cognitive,  depression, and falls . Referrals and appointments  In addition, I have reviewed and discussed with Matthew Daugherty certain preventive protocols, quality metrics, and best practice recommendations. A written personalized care plan for preventive services as well as general preventive health recommendations is available and can be mailed to the patient at his request.      [place] Dec  06/04/2020

## 2020-06-04 NOTE — Patient Instructions (Signed)
  Matthew Daugherty , Thank you for taking time to come for your Medicare Wellness Visit. I appreciate your ongoing commitment to your health goals. Please review the following plan we discussed and let me know if I can assist you in the future.   These are the goals we discussed: Goals    . Exercise 3x per week (30 min per time)     Walk for 30 minutes at least 3 times weekly    . Increase physical activity    . Patient Stated     Continue healthy diet and exercise habits.    . Prevent falls       This is a list of the screening recommended for you and due dates:  Health Maintenance  Topic Date Due  .  Hepatitis C: One time screening is recommended by Center for Disease Control  (CDC) for  adults born from [birth year] through 1963.   07/25/2022*  . Complete foot exam   07/25/2020  . Hemoglobin A1C  11/18/2020  . COVID-19 Vaccine (4 - Booster for Moderna series) 11/25/2020  . Eye exam for diabetics  02/02/2021  . Colon Cancer Screening  02/22/2024  . Tetanus Vaccine  01/16/2029  . Flu Shot  Completed  . Pneumonia vaccines  Completed  . Stool Blood Test  Discontinued  *Topic was postponed. The date shown is not the original due date.

## 2020-07-30 ENCOUNTER — Other Ambulatory Visit: Payer: Self-pay

## 2020-07-30 DIAGNOSIS — D508 Other iron deficiency anemias: Secondary | ICD-10-CM

## 2020-07-30 MED ORDER — INTEGRA PLUS PO CAPS: 1.0000 | ORAL_CAPSULE | Freq: Every day | ORAL | 0 refills | Status: DC

## 2020-10-04 ENCOUNTER — Other Ambulatory Visit: Payer: Self-pay | Admitting: Family Medicine

## 2020-10-04 DIAGNOSIS — D508 Other iron deficiency anemias: Secondary | ICD-10-CM

## 2020-11-29 ENCOUNTER — Encounter: Payer: Self-pay | Admitting: Family Medicine

## 2020-11-29 ENCOUNTER — Other Ambulatory Visit: Payer: Self-pay

## 2020-11-29 ENCOUNTER — Ambulatory Visit (INDEPENDENT_AMBULATORY_CARE_PROVIDER_SITE_OTHER): Payer: Medicare Other | Admitting: Family Medicine

## 2020-11-29 VITALS — BP 131/68 | HR 81 | Temp 97.6°F | Ht 70.0 in | Wt 164.6 lb

## 2020-11-29 DIAGNOSIS — I700 Atherosclerosis of aorta: Secondary | ICD-10-CM | POA: Diagnosis not present

## 2020-11-29 DIAGNOSIS — I152 Hypertension secondary to endocrine disorders: Secondary | ICD-10-CM

## 2020-11-29 DIAGNOSIS — E785 Hyperlipidemia, unspecified: Secondary | ICD-10-CM | POA: Diagnosis not present

## 2020-11-29 DIAGNOSIS — E1169 Type 2 diabetes mellitus with other specified complication: Secondary | ICD-10-CM

## 2020-11-29 DIAGNOSIS — E039 Hypothyroidism, unspecified: Secondary | ICD-10-CM

## 2020-11-29 DIAGNOSIS — E1159 Type 2 diabetes mellitus with other circulatory complications: Secondary | ICD-10-CM | POA: Diagnosis not present

## 2020-11-29 DIAGNOSIS — R7989 Other specified abnormal findings of blood chemistry: Secondary | ICD-10-CM

## 2020-11-29 LAB — BAYER DCA HB A1C WAIVED: HB A1C (BAYER DCA - WAIVED): 6.7 % (ref <7.0)

## 2020-11-29 NOTE — Patient Instructions (Signed)
Sugar remains under excellent control. No changes.  You had labs performed today.  You will be contacted with the results of the labs once they are available, usually in the next 3 business days for routine lab work.  If you have an active my chart account, they will be released to your MyChart.  If you prefer to have these labs released to you via telephone, please let us know.  If you had a pap smear or biopsy performed, expect to be contacted in about 7-10 days.

## 2020-11-29 NOTE — Progress Notes (Signed)
Subjective: CC: DM PCP: Janora Norlander, DO HPI:Matthew Daugherty is a 75 y.o. male presenting to clinic today for:  1. Type 2 Diabetes with hypertension, hyperlipidemia:  Glucometer: Compliant with metformin, Hyzaar, fenofibrate and Lipitor.   Last eye exam: Up-to-date Last foot exam: Needs Last A1c:  Lab Results  Component Value Date   HGBA1C 6.8 05/21/2020   Nephropathy screen indicated?:  On ARB Last flu, zoster and/or pneumovax:  Immunization History  Administered Date(s) Administered   Fluad Quad(high Dose 65+) 01/17/2019, 01/29/2020   Influenza Whole 11/18/2008   Influenza, High Dose Seasonal PF 02/22/2015, 03/09/2016, 04/30/2017, 02/17/2018   Influenza,inj,Quad PF,6+ Mos 04/11/2013, 04/19/2014   Moderna Sars-Covid-2 Vaccination 08/14/2019, 09/11/2019, 05/28/2020   Pneumococcal Conjugate-13 04/11/2013   Pneumococcal Polysaccharide-23 04/19/2014   Td 01/17/2019   Tdap 06/18/2008   Zoster, Live 04/30/2010    ROS: No chest pain, shortness of breath, neuropathy.  2.  Hypothyroidism Compliant with Synthroid 100 mcg daily.  No change in voice, difficulty swallowing, change in bowel habits or energy   ROS: Per HPI  Allergies  Allergen Reactions   Niaspan [Niacin Er] Other (See Comments)    Increases blood sugars    Past Medical History:  Diagnosis Date   Cardiac dysrhythmia, unspecified    Diabetes mellitus without complication ([place])    Essential hypertension, benign    Hyperplasia of prostate    Other and unspecified hyperlipidemia    Thyroid disease    Unspecified transient cerebral ischemia     Current Outpatient Medications:    aspirin 81 MG EC tablet, Take 81 mg by mouth daily., Disp: , Rfl:    atorvastatin (LIPITOR) 80 MG tablet, TAKE 1 TABLET EVERY DAY AT 6:00PM, Disp: 90 tablet, Rfl: 3   Cholecalciferol (VITAMIN D-3) 5000 UNITS TABS, Take 1 tablet by mouth daily., Disp: , Rfl:    FeFum-FePoly-FA-B Cmp-C-Biot (INTEGRA PLUS) CAPS, TAKE 1 CAPSULE  EVERY DAY, Disp: 90 capsule, Rfl: 0   fenofibrate 160 MG tablet, Take 1 tablet (160 mg total) by mouth daily., Disp: 90 tablet, Rfl: 3   levothyroxine (SYNTHROID) 100 MCG tablet, Take 1 tablet (100 mcg total) by mouth daily before breakfast., Disp: 90 tablet, Rfl: 3   losartan-hydrochlorothiazide (HYZAAR) 100-25 MG tablet, Take 1 tablet by mouth daily., Disp: 90 tablet, Rfl: 3   metFORMIN (GLUCOPHAGE) 1000 MG tablet, TAKE 1 TABLET TWICE DAILY WITH A MEAL, Disp: 180 tablet, Rfl: 3   Multiple Vitamin (MULTIVITAMIN WITH MINERALS) TABS tablet, Take 1 tablet by mouth daily., Disp: , Rfl:  Social History   Socioeconomic History   Marital status: Married    Spouse name: [name]   Number of children: 1   Years of education: Not on file   Highest education level: High school graduate  Occupational History   Occupation: retired    Fish farm manager: [employer]  Tobacco Use   Smoking status: Former    Packs/day: 1.00    Years: 44.00    Pack years: 44.00    Types: Cigarettes    Start date: 04/20/1954    Quit date: 06/13/1998    Years since quitting: 22.4   Smokeless tobacco: Never  Vaping Use   Vaping Use: Never used  Substance and Sexual Activity   Alcohol use: No   Drug use: No   Sexual activity: Yes  Other Topics Concern   Not on file  Social History Narrative   Patient lives with his wife in a 2 story home.  He worked in Animator  in the local textile facilities, and retired from [employer].  He has one son, and 2 grand daughters.   Social Determinants of Health   Financial Resource Strain: Not on file  Food Insecurity: Not on file  Transportation Needs: Not on file  Physical Activity: Not on file  Stress: Not on file  Social Connections: Not on file  Intimate Partner Violence: Not on file   Family History  Problem Relation Age of Onset   Cancer Mother        bone   Asthma Father    Heart attack Father        37    Objective: Office vital signs reviewed. BP 131/68   Pulse 81    Temp 97.6 F (36.4 C)   Ht '5\' 10"'  (1.778 m)   Wt 164 lb 9.6 oz (74.7 kg)   SpO2 99%   BMI 23.62 kg/m   Physical Examination:  General: Awake, alert, well nourished, No acute distress HEENT: Normal; no exophthalmos.  No goiter.  No carotid bruits Cardio: regular rate and rhythm, S1S2 heard, no murmurs appreciated Pulm: clear to auscultation bilaterally, no wheezes, rhonchi or rales; normal work of breathing on room air Extremities: warm, well perfused, No edema, cyanosis or clubbing; +2 pulses bilaterally Skin: dry; intact; no rashes or lesions; normal temperature Neuro: see DM foot. No tremor  Diabetic Foot Exam - Simple   Simple Foot Form Diabetic Foot exam was performed with the following findings: Yes 11/29/2020  8:48 AM  Visual Inspection No deformities, no ulcerations, no other skin breakdown bilaterally: Yes Sensation Testing Intact to touch and monofilament testing bilaterally: Yes Pulse Check Posterior Tibialis and Dorsalis pulse intact bilaterally: Yes Comments Onychomycotic changes noted to the great toenails bilaterally     Assessment/ Plan: 75 y.o. male   Controlled type 2 diabetes mellitus with other specified complication, without long-term current use of insulin ([place]) - Plan: CMP14+EGFR, Bayer DCA Hb A1c Waived  Hypertension associated with type 2 diabetes mellitus ([place]) - Plan: CMP14+EGFR  Hyperlipidemia associated with type 2 diabetes mellitus ([place]) - Plan: CMP14+EGFR  Aortic atherosclerosis ([place]) - Plan: CMP14+EGFR  Acquired hypothyroidism - Plan: Thyroid Panel With TSH  Diabetes remains under excellent control at 6.7.  No changes made.  Foot exam was performed today was normal.  Blood pressure controlled.  Continue current regimen  Continue statin and fibrate given aortic atherosclerosis.  Not yet due for fasting lipid  Asymptomatic from a thyroid standpoint.  Check thyroid labs.  Discussed plan for fasting labs and for annual physical at next  visit  No orders of the defined types were placed in this encounter.  No orders of the defined types were placed in this encounter.    Janora Norlander, DO [place] [phone]

## 2020-11-30 LAB — CMP14+EGFR
ALT: 25 IU/L (ref 0–44)
AST: 26 IU/L (ref 0–40)
Albumin/Globulin Ratio: 2.2 (ref 1.2–2.2)
Albumin: 4.7 g/dL (ref 3.7–4.7)
Alkaline Phosphatase: 64 IU/L (ref 44–121)
BUN/Creatinine Ratio: 13 (ref 10–24)
BUN: 15 mg/dL (ref 8–27)
Bilirubin Total: 0.4 mg/dL (ref 0.0–1.2)
CO2: 20 mmol/L (ref 20–29)
Calcium: 10.2 mg/dL (ref 8.6–10.2)
Chloride: 100 mmol/L (ref 96–106)
Creatinine, Ser: 1.19 mg/dL (ref 0.76–1.27)
Globulin, Total: 2.1 g/dL (ref 1.5–4.5)
Glucose: 144 mg/dL — ABNORMAL HIGH (ref 65–99)
Potassium: 5.3 mmol/L — ABNORMAL HIGH (ref 3.5–5.2)
Sodium: 138 mmol/L (ref 134–144)
Total Protein: 6.8 g/dL (ref 6.0–8.5)
eGFR: 65 mL/min/{1.73_m2} (ref >59)

## 2020-11-30 LAB — THYROID PANEL WITH TSH
Free Thyroxine Index: 3.6 (ref 1.2–4.9)
T3 Uptake Ratio: 28 % (ref 24–39)
T4, Total: 12.9 ug/dL — ABNORMAL HIGH (ref 4.5–12.0)
TSH: 1.97 u[IU]/mL (ref 0.450–4.500)

## 2020-12-03 NOTE — Addendum Note (Signed)
Addended by: Everlean Cherry on: 12/03/2020 02:28 PM   Modules accepted: Orders

## 2020-12-18 ENCOUNTER — Other Ambulatory Visit: Payer: Self-pay | Admitting: Family Medicine

## 2020-12-18 DIAGNOSIS — E039 Hypothyroidism, unspecified: Secondary | ICD-10-CM

## 2020-12-18 DIAGNOSIS — I152 Hypertension secondary to endocrine disorders: Secondary | ICD-10-CM

## 2021-01-20 ENCOUNTER — Other Ambulatory Visit: Payer: Self-pay | Admitting: Family Medicine

## 2021-01-20 DIAGNOSIS — E785 Hyperlipidemia, unspecified: Secondary | ICD-10-CM

## 2021-01-20 DIAGNOSIS — I700 Atherosclerosis of aorta: Secondary | ICD-10-CM

## 2021-01-20 DIAGNOSIS — E1169 Type 2 diabetes mellitus with other specified complication: Secondary | ICD-10-CM

## 2021-02-19 DIAGNOSIS — Z23 Encounter for immunization: Secondary | ICD-10-CM | POA: Diagnosis not present

## 2021-02-24 ENCOUNTER — Other Ambulatory Visit: Payer: Self-pay | Admitting: Family Medicine

## 2021-02-24 DIAGNOSIS — D508 Other iron deficiency anemias: Secondary | ICD-10-CM

## 2021-02-24 DIAGNOSIS — Z20822 Contact with and (suspected) exposure to covid-19: Secondary | ICD-10-CM | POA: Diagnosis not present

## 2021-05-26 ENCOUNTER — Other Ambulatory Visit: Payer: Self-pay | Admitting: Family Medicine

## 2021-05-26 DIAGNOSIS — E039 Hypothyroidism, unspecified: Secondary | ICD-10-CM

## 2021-05-28 DIAGNOSIS — Z20822 Contact with and (suspected) exposure to covid-19: Secondary | ICD-10-CM | POA: Diagnosis not present

## 2021-06-03 ENCOUNTER — Encounter: Payer: Self-pay | Admitting: Family Medicine

## 2021-06-03 ENCOUNTER — Ambulatory Visit (INDEPENDENT_AMBULATORY_CARE_PROVIDER_SITE_OTHER): Payer: Medicare Other | Admitting: Family Medicine

## 2021-06-03 ENCOUNTER — Telehealth: Payer: Medicare Other | Admitting: Family Medicine

## 2021-06-03 VITALS — BP 127/72 | HR 87 | Temp 98.0°F | Ht 70.0 in | Wt 169.6 lb

## 2021-06-03 DIAGNOSIS — E785 Hyperlipidemia, unspecified: Secondary | ICD-10-CM | POA: Diagnosis not present

## 2021-06-03 DIAGNOSIS — E1169 Type 2 diabetes mellitus with other specified complication: Secondary | ICD-10-CM | POA: Diagnosis not present

## 2021-06-03 DIAGNOSIS — B350 Tinea barbae and tinea capitis: Secondary | ICD-10-CM

## 2021-06-03 DIAGNOSIS — I152 Hypertension secondary to endocrine disorders: Secondary | ICD-10-CM

## 2021-06-03 DIAGNOSIS — Z23 Encounter for immunization: Secondary | ICD-10-CM | POA: Diagnosis not present

## 2021-06-03 DIAGNOSIS — N400 Enlarged prostate without lower urinary tract symptoms: Secondary | ICD-10-CM

## 2021-06-03 DIAGNOSIS — E039 Hypothyroidism, unspecified: Secondary | ICD-10-CM

## 2021-06-03 DIAGNOSIS — I700 Atherosclerosis of aorta: Secondary | ICD-10-CM

## 2021-06-03 DIAGNOSIS — Z0000 Encounter for general adult medical examination without abnormal findings: Secondary | ICD-10-CM

## 2021-06-03 DIAGNOSIS — E1159 Type 2 diabetes mellitus with other circulatory complications: Secondary | ICD-10-CM | POA: Diagnosis not present

## 2021-06-03 LAB — BAYER DCA HB A1C WAIVED: HB A1C (BAYER DCA - WAIVED): 7.3 % — ABNORMAL HIGH (ref 4.8–5.6)

## 2021-06-03 MED ORDER — METFORMIN HCL 1000 MG PO TABS: ORAL_TABLET | ORAL | 3 refills | Status: DC

## 2021-06-03 MED ORDER — FENOFIBRATE 160 MG PO TABS: 160.0000 mg | ORAL_TABLET | Freq: Every day | ORAL | 3 refills | Status: DC

## 2021-06-03 MED ORDER — LOSARTAN POTASSIUM-HCTZ 100-25 MG PO TABS: 1.0000 | ORAL_TABLET | Freq: Every day | ORAL | 3 refills | Status: DC

## 2021-06-03 MED ORDER — ATORVASTATIN CALCIUM 80 MG PO TABS: 80.0000 mg | ORAL_TABLET | Freq: Every day | ORAL | 3 refills | Status: DC

## 2021-06-03 MED ORDER — LEVOTHYROXINE SODIUM 100 MCG PO TABS: ORAL_TABLET | ORAL | 2 refills | Status: DC

## 2021-06-03 MED ORDER — KETOCONAZOLE 2 % EX CREA: 1.0000 "application " | TOPICAL_CREAM | Freq: Every day | CUTANEOUS | 0 refills | Status: DC

## 2021-06-03 NOTE — Patient Instructions (Signed)
You had labs performed today.  You will be contacted with the results of the labs once they are available, usually in the next 3 business days for routine lab work.  If you have an active my chart account, they will be released to your MyChart.  If you prefer to have these labs released to you via telephone, please let us know.   Preventive Care 27 Years and Older, Male Preventive care refers to lifestyle choices and visits with your health care provider that can promote health and wellness. Preventive care visits are also called wellness exams. What can I expect for my preventive care visit? Counseling During your preventive care visit, your health care provider may ask about your: Medical history, including: Past medical problems. Family medical history. History of falls. Current health, including: Emotional well-being. Home life and relationship well-being. Sexual activity. Memory and ability to understand (cognition). Lifestyle, including: Alcohol, nicotine or tobacco, and drug use. Access to firearms. Diet, exercise, and sleep habits. Work and work Statistician. Sunscreen use. Safety issues such as seatbelt and bike helmet use. Physical exam Your health care provider will check your: Height and weight. These may be used to calculate your BMI (body mass index). BMI is a measurement that tells if you are at a healthy weight. Waist circumference. This measures the distance around your waistline. This measurement also tells if you are at a healthy weight and may help predict your risk of certain diseases, such as type 2 diabetes and high blood pressure. Heart rate and blood pressure. Body temperature. Skin for abnormal spots. What immunizations do I need? Vaccines are usually given at various ages, according to a schedule. Your health care provider will recommend vaccines for you based on your age, medical history, and lifestyle or other factors, such as travel or where you work. What  tests do I need? Screening Your health care provider may recommend screening tests for certain conditions. This may include: Lipid and cholesterol levels. Diabetes screening. This is done by checking your blood sugar (glucose) after you have not eaten for a while (fasting). Hepatitis C test. Hepatitis B test. HIV (human immunodeficiency virus) test. STI (sexually transmitted infection) testing, if you are at risk. Lung cancer screening. Colorectal cancer screening. Prostate cancer screening. Abdominal aortic aneurysm (AAA) screening. You may need this if you are a current or former smoker. Talk with your health care provider about your test results, treatment options, and if necessary, the need for more tests. Follow these instructions at home: Eating and drinking  Eat a diet that includes fresh fruits and vegetables, whole grains, lean protein, and low-fat dairy products. Limit your intake of foods with high amounts of sugar, saturated fats, and salt. Take vitamin and mineral supplements as recommended by your health care provider. Do not drink alcohol if your health care provider tells you not to drink. If you drink alcohol: Limit how much you have to 0-2 drinks a day. Know how much alcohol is in your drink. In the [place] one drink equals one 12 oz bottle of beer (355 mL), one 5 oz glass of wine (148 mL), or one 1 oz glass of hard liquor (44 mL). Lifestyle Brush your teeth every morning and night with fluoride toothpaste. Floss one time each day. Exercise for at least 30 minutes 5 or more days each week. Do not use any products that contain nicotine or tobacco. These products include cigarettes, chewing tobacco, and vaping devices, such as e-cigarettes. If you need help quitting,  ask your health care provider. Do not use drugs. If you are sexually active, practice safe sex. Use a condom or other form of protection to prevent STIs. Take aspirin only as told by your health care provider.  Make sure that you understand how much to take and what form to take. Work with your health care provider to find out whether it is safe and beneficial for you to take aspirin daily. Ask your health care provider if you need to take a cholesterol-lowering medicine (statin). Find healthy ways to manage stress, such as: Meditation, yoga, or listening to music. Journaling. Talking to a trusted person. Spending time with friends and family. Safety Always wear your seat belt while driving or riding in a vehicle. Do not drive: If you have been drinking alcohol. Do not ride with someone who has been drinking. When you are tired or distracted. While texting. If you have been using any mind-altering substances or drugs. Wear a helmet and other protective equipment during sports activities. If you have firearms in your house, make sure you follow all gun safety procedures. Minimize exposure to UV radiation to reduce your risk of skin cancer. What's next? Visit your health care provider once a year for an annual wellness visit. Ask your health care provider how often you should have your eyes and teeth checked. Stay up to date on all vaccines. This information is not intended to replace advice given to you by your health care provider. Make sure you discuss any questions you have with your health care provider. Document Revised: 10/02/2020 Document Reviewed: 10/02/2020 Elsevier Patient Education  [place].

## 2021-06-03 NOTE — Progress Notes (Signed)
Matthew Daugherty is a 76 y.o. male presents to office today for annual physical exam examination.    Concerns today include: 1. Type 2 Diabetes with hypertension, hyperlipidemia, Ao atherosclerosis:  Compliant with Metformin, fenofibrate, lipitor, hyzaar.   Last eye exam: needs Last foot exam: UTD Last A1c:  Lab Results  Component Value Date   HGBA1C 6.7 11/29/2020   Nephropathy screen indicated?: UTD Last flu, zoster and/or pneumovax:  Immunization History  Administered Date(s) Administered   Fluad Quad(high Dose 65+) 01/17/2019, 01/29/2020, 02/19/2021   Influenza Whole 11/18/2008   Influenza, High Dose Seasonal PF 02/22/2015, 03/09/2016, 04/30/2017, 02/17/2018   Influenza,inj,Quad PF,6+ Mos 04/11/2013, 04/19/2014   Moderna Sars-Covid-2 Vaccination 08/14/2019, 09/11/2019, 05/28/2020   Pneumococcal Conjugate-13 04/11/2013   Pneumococcal Polysaccharide-23 04/19/2014   Td 01/17/2019   Tdap 06/18/2008   Zoster, Live 04/30/2010    ROS: Denies dizziness, LOC, polyuria, polydipsia, unintended weight loss/gain, foot ulcerations, numbness or tingling in extremities, shortness of breath or chest pain.  2. Hypothyroidism Compliant with Synthroid.  No difficulty swallowing, change in bowel habits or energy.  3.  Rash Patient reports a mildly red rash on his mustache area.  No drainage.  Mildly irritating.  Occupation: Retired, Marital status: Married to [place], Substance use: None Diet: Balanced, Exercise: Tries to stay active but admits that he is not as active during the winter months Last colonoscopy: Up-to-date Refills needed today: none Immunizations needed: Shingles Immunization History  Administered Date(s) Administered   Fluad Quad(high Dose 65+) 01/17/2019, 01/29/2020, 02/19/2021   Influenza Whole 11/18/2008   Influenza, High Dose Seasonal PF 02/22/2015, 03/09/2016, 04/30/2017, 02/17/2018   Influenza,inj,Quad PF,6+ Mos 04/11/2013, 04/19/2014   Moderna Sars-Covid-2  Vaccination 08/14/2019, 09/11/2019, 05/28/2020   Pneumococcal Conjugate-13 04/11/2013   Pneumococcal Polysaccharide-23 04/19/2014   Td 01/17/2019   Tdap 06/18/2008   Zoster, Live 04/30/2010     Past Medical History:  Diagnosis Date   Cardiac dysrhythmia, unspecified    Diabetes mellitus without complication ([place])    Essential hypertension, benign    Hyperplasia of prostate    Other and unspecified hyperlipidemia    Thyroid disease    Unspecified transient cerebral ischemia    Social History   Socioeconomic History   Marital status: Married    Spouse name: Matthew Daugherty   Number of children: 1   Years of education: Not on file   Highest education level: High school graduate  Occupational History   Occupation: retired    Fish farm manager: [employer]  Tobacco Use   Smoking status: Former    Packs/day: 1.00    Years: 44.00    Pack years: 44.00    Types: Cigarettes    Start date: 04/20/1954    Quit date: 06/13/1998    Years since quitting: 22.9   Smokeless tobacco: Never  Vaping Use   Vaping Use: Never used  Substance and Sexual Activity   Alcohol use: No   Drug use: No   Sexual activity: Yes  Other Topics Concern   Not on file  Social History Narrative   Patient lives with his wife in a 2 story home.  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  He has one son, and 2 grand daughters.   Social Determinants of Health   Financial Resource Strain: Not on file  Food Insecurity: Not on file  Transportation Needs: Not on file  Physical Activity: Not on file  Stress: Not on file  Social Connections: Not on file  Intimate  Partner Violence: Not on file   Past Surgical History:  Procedure Laterality Date   APPENDECTOMY     CHOLECYSTECTOMY     LAPAROSCOPIC CHOLECYSTECTOMY SINGLE SITE WITH INTRAOPERATIVE CHOLANGIOGRAM N/A 06/22/2015   Procedure: LAPAROSCOPIC CHOLECYSTECTOMY SINGLE SITE WITH INTRAOPERATIVE CHOLANGIOGRAM, PRIMARY UMBILICAL HERNIA  REPAIR;  Surgeon: Michael Boston, MD;  Location: [facility];  Service: General;  Laterality: N/A;   Family History  Problem Relation Age of Onset   Cancer Mother        bone   Asthma Father    Heart attack Father        45    Current Outpatient Medications:    aspirin 81 MG EC tablet, Take 81 mg by mouth daily., Disp: , Rfl:    atorvastatin (LIPITOR) 80 MG tablet, TAKE 1 TABLET EVERY DAY AT 6:00PM, Disp: 90 tablet, Rfl: 1   Cholecalciferol (VITAMIN D-3) 5000 UNITS TABS, Take 1 tablet by mouth daily., Disp: , Rfl:    FeFum-FePoly-FA-B Cmp-C-Biot (INTEGRA PLUS) CAPS, TAKE 1 CAPSULE EVERY DAY, Disp: 90 capsule, Rfl: 0   fenofibrate 160 MG tablet, TAKE 1 TABLET EVERY DAY, Disp: 90 tablet, Rfl: 1   levothyroxine (SYNTHROID) 100 MCG tablet, TAKE 1 TABLET EVERY DAY BEFORE BREAKFAST, Disp: 90 tablet, Rfl: 0   losartan-hydrochlorothiazide (HYZAAR) 100-25 MG tablet, TAKE 1 TABLET EVERY DAY, Disp: 90 tablet, Rfl: 1   metFORMIN (GLUCOPHAGE) 1000 MG tablet, TAKE 1 TABLET TWICE DAILY WITH MEALS, Disp: 180 tablet, Rfl: 1   Multiple Vitamin (MULTIVITAMIN WITH MINERALS) TABS tablet, Take 1 tablet by mouth daily., Disp: , Rfl:   Allergies  Allergen Reactions   Niaspan [Niacin Er] Other (See Comments)    Increases blood sugars      ROS: Review of Systems A comprehensive review of systems was negative except for: Integument/breast: positive for rash on mustache area    Physical exam BP 127/72    Pulse 87    Temp 98 F (36.7 C)    Ht '5\' 10"'  (1.778 m)    Wt 169 lb 9.6 oz (76.9 kg)    SpO2 100%    BMI 24.34 kg/m  General appearance: alert, cooperative, appears stated age, and no distress Head: Normocephalic, without obvious abnormality, atraumatic Eyes: conjunctivae/corneas clear. PERRL, EOM's intact. Fundi benign. Ears: normal TM's and external ear canals both ears Nose: Nares normal. Septum midline. Mucosa normal. No drainage or sinus tenderness. Throat: lips, mucosa, and tongue normal; teeth and gums  normal Neck: no adenopathy, no carotid bruit, supple, symmetrical, trachea midline, and thyroid not enlarged, symmetric, no tenderness/mass/nodules Back: symmetric, no curvature. ROM normal. No CVA tenderness. Lungs: clear to auscultation bilaterally Chest wall: no tenderness Heart: regular rate and rhythm, S1, S2 normal, no murmur, click, rub or gallop Abdomen: soft, non-tender; bowel sounds normal; no masses,  no organomegaly Extremities: extremities normal, atraumatic, no cyanosis or edema Pulses: 2+ and symmetric Skin:  Mildly erythematous and raised maculopapular rash noted along the mustache area bilaterally.  No exudate or crusting Lymph nodes: Cervical, supraclavicular, and axillary nodes normal. Neurologic: Grossly normal Psych: Mood stable, speech normal, affect appropriate   Assessment/ Plan: Matthew Daugherty here for annual physical exam.   Controlled type 2 diabetes mellitus with other specified complication, without long-term current use of insulin ([place]) - Plan: Bayer DCA Hb A1c Waived, metFORMIN (GLUCOPHAGE) 1000 MG tablet  Annual physical exam - Plan: Varicella-zoster vaccine IM (Shingrix)  Hypertension associated with type 2 diabetes mellitus ([place]) - Plan: CMP14+EGFR, losartan-hydrochlorothiazide (HYZAAR) 100-25 MG  tablet  Hyperlipidemia associated with type 2 diabetes mellitus ([place] ) - Plan: CMP14+EGFR, Lipid Panel, atorvastatin (LIPITOR) 80 MG tablet, fenofibrate 160 MG tablet  Aortic atherosclerosis ([facility]) - Plan: CMP14+EGFR, Lipid Panel, atorvastatin (LIPITOR) 80 MG tablet, fenofibrate 160 MG tablet  Acquired hypothyroidism - Plan: TSH, T4, Free, levothyroxine (SYNTHROID) 100 MCG tablet  Benign prostatic hyperplasia without lower urinary tract symptoms - Plan: PSA  Tinea barbae - Plan: ketoconazole (NIZORAL) 2 % cream  Shingles vaccination administered today.  Sugar remains within normal range for patient's age over 42.  His A1c was 7.3.  Continue metformin 1000 mg  twice daily.  He needs to get his diabetic eye exam and I have reached out to [facility] to see if perhaps this can be performed here in office for the patient  Blood pressure well controlled.  No changes.  Check metabolic panel, liver enzymes.  Continue Hyzaar  Check fasting lipid.  Cholesterol medication sent to pharmacy.  For thyroid standpoint asymptomatic.  Continue Synthroid.  Check TSH, free T4  Remains asymptomatic from a BPH standpoint.  Check PSA  Skin rash consistent with tinea Barbae.  Ketoconazole topically prescribed.  If this does not clear with his rash up, would consider orals  Amiee Wiley M. Lajuana Ripple, DO

## 2021-06-04 LAB — CMP14+EGFR
ALT: 31 IU/L (ref 0–44)
AST: 33 IU/L (ref 0–40)
Albumin/Globulin Ratio: 2 (ref 1.2–2.2)
Albumin: 5 g/dL — ABNORMAL HIGH (ref 3.7–4.7)
Alkaline Phosphatase: 63 IU/L (ref 44–121)
BUN/Creatinine Ratio: 11 (ref 10–24)
BUN: 14 mg/dL (ref 8–27)
Bilirubin Total: 0.5 mg/dL (ref 0.0–1.2)
CO2: 22 mmol/L (ref 20–29)
Calcium: 10.8 mg/dL — ABNORMAL HIGH (ref 8.6–10.2)
Chloride: 99 mmol/L (ref 96–106)
Creatinine, Ser: 1.23 mg/dL (ref 0.76–1.27)
Globulin, Total: 2.5 g/dL (ref 1.5–4.5)
Glucose: 149 mg/dL — ABNORMAL HIGH (ref 70–99)
Potassium: 4.6 mmol/L (ref 3.5–5.2)
Sodium: 138 mmol/L (ref 134–144)
Total Protein: 7.5 g/dL (ref 6.0–8.5)
eGFR: 62 mL/min/{1.73_m2} (ref >59)

## 2021-06-04 LAB — TSH: TSH: 3.68 u[IU]/mL (ref 0.450–4.500)

## 2021-06-04 LAB — T4, FREE: Free T4: 1.75 ng/dL (ref 0.82–1.77)

## 2021-06-04 LAB — LIPID PANEL
Chol/HDL Ratio: 3 ratio (ref 0.0–5.0)
Cholesterol, Total: 127 mg/dL (ref 100–199)
HDL: 43 mg/dL (ref >39)
LDL Chol Calc (NIH): 60 mg/dL (ref 0–99)
Triglycerides: 140 mg/dL (ref 0–149)
VLDL Cholesterol Cal: 24 mg/dL (ref 5–40)

## 2021-06-04 LAB — PSA: Prostate Specific Ag, Serum: 1.6 ng/mL (ref 0.0–4.0)

## 2021-06-06 ENCOUNTER — Ambulatory Visit (INDEPENDENT_AMBULATORY_CARE_PROVIDER_SITE_OTHER): Payer: Medicare Other

## 2021-06-06 VITALS — Wt 169.0 lb

## 2021-06-06 DIAGNOSIS — Z0000 Encounter for general adult medical examination without abnormal findings: Secondary | ICD-10-CM | POA: Diagnosis not present

## 2021-06-06 NOTE — Patient Instructions (Addendum)
Matthew Daugherty , Thank you for taking time to come for your Medicare Wellness Visit. I appreciate your ongoing commitment to your health goals. Please review the following plan we discussed and let me know if I can assist you in the future.   Screening recommendations/referrals: Colonoscopy: Done 02/22/2019 - Repeat in 5 years  Recommended yearly ophthalmology/optometry visit for glaucoma screening and checkup Recommended yearly dental visit for hygiene and checkup  Vaccinations: Influenza vaccine: Done 02/19/2021 - Repeat annually  Pneumococcal vaccine: Done 04/11/2013 & 04/19/2014 Tdap vaccine: Done 01/17/2019 - Repeat in 10 years Shingles vaccine: Done 06/03/2021 - get second dose in 2-6 months   Covid-19: Done 08/14/2019, 09/11/2019, & 05/28/2020  Advanced directives: Please bring a copy of your health care power of attorney and living will to the office to be added to your chart at your convenience.   Conditions/risks identified: Keep up the great work! Aim for 30 minutes of exercise or brisk walking each day, drink 6-8 glasses of water and eat lots of fruits and vegetables.   Next appointment: Follow up in one year for your annual wellness visit.   Preventive Care 35 Years and Older, Male  Preventive care refers to lifestyle choices and visits with your health care provider that can promote health and wellness. What does preventive care include? A yearly physical exam. This is also called an annual well check. Dental exams once or twice a year. Routine eye exams. Ask your health care provider how often you should have your eyes checked. Personal lifestyle choices, including: Daily care of your teeth and gums. Regular physical activity. Eating a healthy diet. Avoiding tobacco and drug use. Limiting alcohol use. Practicing safe sex. Taking low doses of aspirin every day. Taking vitamin and mineral supplements as recommended by your health care provider. What happens during an annual well  check? The services and screenings done by your health care provider during your annual well check will depend on your age, overall health, lifestyle risk factors, and family history of disease. Counseling  Your health care provider may ask you questions about your: Alcohol use. Tobacco use. Drug use. Emotional well-being. Home and relationship well-being. Sexual activity. Eating habits. History of falls. Memory and ability to understand (cognition). Work and work Statistician. Screening  You may have the following tests or measurements: Height, weight, and BMI. Blood pressure. Lipid and cholesterol levels. These may be checked every 5 years, or more frequently if you are over 15 years old. Skin check. Lung cancer screening. You may have this screening every year starting at age 4 if you have a 30-pack-year history of smoking and currently smoke or have quit within the past 15 years. Fecal occult blood test (FOBT) of the stool. You may have this test every year starting at age 34. Flexible sigmoidoscopy or colonoscopy. You may have a sigmoidoscopy every 5 years or a colonoscopy every 10 years starting at age 79. Prostate cancer screening. Recommendations will vary depending on your family history and other risks. Hepatitis C blood test. Hepatitis B blood test. Sexually transmitted disease (STD) testing. Diabetes screening. This is done by checking your blood sugar (glucose) after you have not eaten for a while (fasting). You may have this done every 1-3 years. Abdominal aortic aneurysm (AAA) screening. You may need this if you are a current or former smoker. Osteoporosis. You may be screened starting at age 35 if you are at high risk. Talk with your health care provider about your test results, treatment options,  and if necessary, the need for more tests. Vaccines  Your health care provider may recommend certain vaccines, such as: Influenza vaccine. This is recommended every  year. Tetanus, diphtheria, and acellular pertussis (Tdap, Td) vaccine. You may need a Td booster every 10 years. Zoster vaccine. You may need this after age 25. Pneumococcal 13-valent conjugate (PCV13) vaccine. One dose is recommended after age 66. Pneumococcal polysaccharide (PPSV23) vaccine. One dose is recommended after age 27. Talk to your health care provider about which screenings and vaccines you need and how often you need them. This information is not intended to replace advice given to you by your health care provider. Make sure you discuss any questions you have with your health care provider. Document Released: 05/03/2015 Document Revised: 12/25/2015 Document Reviewed: 02/05/2015 Elsevier Interactive Patient Education  2017 [employer] Prevention in the Home Falls can cause injuries. They can happen to people of all ages. There are many things you can do to make your home safe and to help prevent falls. What can I do on the outside of my home? Regularly fix the edges of walkways and driveways and fix any cracks. Remove anything that might make you trip as you walk through a door, such as a raised step or threshold. Trim any bushes or trees on the path to your home. Use bright outdoor lighting. Clear any walking paths of anything that might make someone trip, such as rocks or tools. Regularly check to see if handrails are loose or broken. Make sure that both sides of any steps have handrails. Any raised decks and porches should have guardrails on the edges. Have any leaves, snow, or ice cleared regularly. Use sand or salt on walking paths during winter. Clean up any spills in your garage right away. This includes oil or grease spills. What can I do in the bathroom? Use night lights. Install grab bars by the toilet and in the tub and shower. Do not use towel bars as grab bars. Use non-skid mats or decals in the tub or shower. If you need to sit down in the shower, use a  plastic, non-slip stool. Keep the floor dry. Clean up any water that spills on the floor as soon as it happens. Remove soap buildup in the tub or shower regularly. Attach bath mats securely with double-sided non-slip rug tape. Do not have throw rugs and other things on the floor that can make you trip. What can I do in the bedroom? Use night lights. Make sure that you have a light by your bed that is easy to reach. Do not use any sheets or blankets that are too big for your bed. They should not hang down onto the floor. Have a firm chair that has side arms. You can use this for support while you get dressed. Do not have throw rugs and other things on the floor that can make you trip. What can I do in the kitchen? Clean up any spills right away. Avoid walking on wet floors. Keep items that you use a lot in easy-to-reach places. If you need to reach something above you, use a strong step stool that has a grab bar. Keep electrical cords out of the way. Do not use floor polish or wax that makes floors slippery. If you must use wax, use non-skid floor wax. Do not have throw rugs and other things on the floor that can make you trip. What can I do with my stairs? Do not leave  any items on the stairs. Make sure that there are handrails on both sides of the stairs and use them. Fix handrails that are broken or loose. Make sure that handrails are as long as the stairways. Check any carpeting to make sure that it is firmly attached to the stairs. Fix any carpet that is loose or worn. Avoid having throw rugs at the top or bottom of the stairs. If you do have throw rugs, attach them to the floor with carpet tape. Make sure that you have a light switch at the top of the stairs and the bottom of the stairs. If you do not have them, ask someone to add them for you. What else can I do to help prevent falls? Wear shoes that: Do not have high heels. Have rubber bottoms. Are comfortable and fit you  well. Are closed at the toe. Do not wear sandals. If you use a stepladder: Make sure that it is fully opened. Do not climb a closed stepladder. Make sure that both sides of the stepladder are locked into place. Ask someone to hold it for you, if possible. Clearly mark and make sure that you can see: Any grab bars or handrails. First and last steps. Where the edge of each step is. Use tools that help you move around (mobility aids) if they are needed. These include: Canes. Walkers. Scooters. Crutches. Turn on the lights when you go into a dark area. Replace any light bulbs as soon as they burn out. Set up your furniture so you have a clear path. Avoid moving your furniture around. If any of your floors are uneven, fix them. If there are any pets around you, be aware of where they are. Review your medicines with your doctor. Some medicines can make you feel dizzy. This can increase your chance of falling. Ask your doctor what other things that you can do to help prevent falls. This information is not intended to replace advice given to you by your health care provider. Make sure you discuss any questions you have with your health care provider. Document Released: 01/31/2009 Document Revised: 09/12/2015 Document Reviewed: 05/11/2014 Elsevier Interactive Patient Education  2017 [employer].

## 2021-06-06 NOTE — Progress Notes (Signed)
Subjective:   Matthew Daugherty is a 76 y.o. male who presents for Medicare Annual/Subsequent preventive examination.  Virtual Visit via Telephone Note  I connected with  Matthew Daugherty on 06/06/21 at  1:15 PM EST by telephone and verified that I am speaking with the correct person using two identifiers.  Location: Patient: Home Provider: [facility] Persons participating in the virtual visit: patient/Nurse Health Advisor   I discussed the limitations, risks, security and privacy concerns of performing an evaluation and management service by telephone and the availability of in person appointments. The patient expressed understanding and agreed to proceed.  Interactive audio and video telecommunications were attempted between this nurse and patient, however failed, due to patient having technical difficulties OR patient did not have access to video capability.  We continued and completed visit with audio only.  Some vital signs may be absent or patient reported.   Matthew Rodger E Clella Mckeel, LPN   Review of Systems     Cardiac Risk Factors include: advanced age (>48men, >54 women);diabetes mellitus;dyslipidemia;hypertension;male gender;Other (see comment), Risk factor comments: atherosclerosis and anemia     Objective:    Today's Vitals   06/06/21 1254  Weight: 169 lb (76.7 kg)   Body mass index is 24.25 kg/m.  Advanced Directives 06/06/2021 06/04/2020 06/01/2019 05/30/2018 05/25/2017 02/22/2017 01/26/2017  Does Patient Have a Medical Advance Directive? Yes No Yes Yes No Yes Yes  Type of [employer] [place];Living will Healthcare Power of [place]  Does patient want to make changes to medical advance directive? - No - Patient declined No - Patient declined No - Patient declined - No - Patient declined No - Patient declined  Copy of [place] in Chart? No - copy requested - No  - copy requested No - copy requested - No - copy requested No - copy requested  Would patient like information on creating a medical advance directive? - No - Patient declined - - Yes (MAU/Ambulatory/Procedural Areas - Information given) - -    Current Medications (verified) Outpatient Encounter Medications as of 06/06/2021  Medication Sig   aspirin 81 MG EC tablet Take 81 mg by mouth daily.   atorvastatin (LIPITOR) 80 MG tablet Take 1 tablet (80 mg total) by mouth daily.   Cholecalciferol (VITAMIN D-3) 5000 UNITS TABS Take 1 tablet by mouth daily.   FeFum-FePoly-FA-B Cmp-C-Biot (INTEGRA PLUS) CAPS TAKE 1 CAPSULE EVERY DAY   fenofibrate 160 MG tablet Take 1 tablet (160 mg total) by mouth daily.   ketoconazole (NIZORAL) 2 % cream Apply 1 application topically daily. To rash on mustache area until rash resolved (2-4 weeks)   levothyroxine (SYNTHROID) 100 MCG tablet TAKE 1 TABLET EVERY DAY BEFORE BREAKFAST   losartan-hydrochlorothiazide (HYZAAR) 100-25 MG tablet Take 1 tablet by mouth daily.   metFORMIN (GLUCOPHAGE) 1000 MG tablet TAKE 1 TABLET TWICE DAILY WITH MEALS   Multiple Vitamin (MULTIVITAMIN WITH MINERALS) TABS tablet Take 1 tablet by mouth daily.   No facility-administered encounter medications on file as of 06/06/2021.    Allergies (verified) Niaspan [niacin er]   History: Past Medical History:  Diagnosis Date   Cardiac dysrhythmia, unspecified    Diabetes mellitus without complication ([place])    Essential hypertension, benign    Hyperplasia of prostate    Other and unspecified hyperlipidemia    Thyroid disease    Unspecified transient cerebral ischemia    Past Surgical  History:  Procedure Laterality Date   APPENDECTOMY     CHOLECYSTECTOMY     LAPAROSCOPIC CHOLECYSTECTOMY SINGLE SITE WITH INTRAOPERATIVE CHOLANGIOGRAM N/A 06/22/2015   Procedure: LAPAROSCOPIC CHOLECYSTECTOMY SINGLE SITE WITH INTRAOPERATIVE CHOLANGIOGRAM, PRIMARY UMBILICAL HERNIA REPAIR;  Surgeon: Michael Boston,  MD;  Location: [facility];  Service: General;  Laterality: N/A;   Family History  Problem Relation Age of Onset   Cancer Mother        bone   Asthma Father    Heart attack Father        82   Social History   Socioeconomic History   Marital status: Married    Spouse name: [name]   Number of children: 1   Years of education: Not on file   Highest education level: High school graduate  Occupational History   Occupation: retired    Fish farm manager: [employer]  Tobacco Use   Smoking status: Former    Packs/day: 1.00    Years: 44.00    Pack years: 44.00    Types: Cigarettes    Start date: 04/20/1954    Quit date: 06/13/1998    Years since quitting: 22.9   Smokeless tobacco: Never  Vaping Use   Vaping Use: Never used  Substance and Sexual Activity   Alcohol use: No   Drug use: No   Sexual activity: Yes  Other Topics Concern   Not on file  Social History Narrative   Patient lives with his wife in a 2 story home.  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  He has one son, and 2 grand daughters.   Social Determinants of Health   Financial Resource Strain: Low Risk    Difficulty of Paying Living Expenses: Not hard at all  Food Insecurity: No Food Insecurity   Worried About Charity fundraiser in the Last Year: Never true   Drew in the Last Year: Never true  Transportation Needs: No Transportation Needs   Lack of Transportation (Medical): No   Lack of Transportation (Non-Medical): No  Physical Activity: Sufficiently Active   Days of Exercise per Week: 5 days   Minutes of Exercise per Session: 60 min  Stress: No Stress Concern Present   Feeling of Stress : Not at all  Social Connections: Socially Integrated   Frequency of Communication with Friends and Family: More than three times a week   Frequency of Social Gatherings with Friends and Family: More than three times a week   Attends Religious Services: More than 4 times per year    Active Member of [employer] or Organizations: Yes   Attends Music therapist: More than 4 times per year   Marital Status: Married    Tobacco Counseling Counseling given: Not Answered   Clinical Intake:  Pre-visit preparation completed: Yes  Pain : No/denies pain     BMI - recorded: 24.25 Nutritional Status: BMI of 19-24  Normal Nutritional Risks: None Diabetes: Yes CBG done?: No Did pt. bring in CBG monitor from home?: No  How often do you need to have someone help you when you read instructions, pamphlets, or other written materials from your doctor or pharmacy?: 1 - Never  Diabetic? Nutrition Risk Assessment:  Has the patient had any N/V/D within the last 2 months?  No  Does the patient have any non-healing wounds?  No  Has the patient had any unintentional weight loss or weight gain?  No   Diabetes:  Is the patient diabetic?  Yes  If diabetic, was a CBG obtained today?  No  Did the patient bring in their glucometer from home?  No  How often do you monitor your CBG's? daily.   Financial Strains and Diabetes Management:  Are you having any financial strains with the device, your supplies or your medication? No .  Does the patient want to be seen by Chronic Care Management for management of their diabetes?  No  Would the patient like to be referred to a Nutritionist or for Diabetic Management?  No   Diabetic Exams:  Diabetic Eye Exam: Completed 01/2021.   Diabetic Foot Exam: Completed 11/29/2020. Pt has been advised about the importance in completing this exam. Pt is scheduled for diabetic foot exam on next visit.    Interpreter Needed?: No  Information entered by :: Jasmia Angst, LPN   Activities of Daily Living In your present state of health, do you have any difficulty performing the following activities: 06/06/2021  Hearing? N  Vision? N  Difficulty concentrating or making decisions? N  Walking or climbing stairs? N  Dressing or bathing? N   Doing errands, shopping? N  Preparing Food and eating ? N  Using the Toilet? N  In the past six months, have you accidently leaked urine? N  Do you have problems with loss of bowel control? N  Managing your Medications? N  Managing your Finances? N  Housekeeping or managing your Housekeeping? N  Some recent data might be hidden    Patient Care Team: Janora Norlander, DO as PCP - General (Family Medicine) Clarene Essex, MD (Gastroenterology) Minus Breeding, MD as Consulting Physician (Cardiology)  Indicate any recent Medical Services you may have received from other than [facility] providers in the past year (date may be approximate).     Assessment:   This is a routine wellness examination for [place].  Hearing/Vision screen Hearing Screening - Comments:: Denies hearing difficulties   Vision Screening - Comments:: Wears rx glasses - up to date with routine eye exams with [facility] in [place] issues and exercise activities discussed: Current Exercise Habits: Home exercise routine, Type of exercise: walking;treadmill;strength training/weights, Time (Minutes): 60, Frequency (Times/Week): 5, Weekly Exercise (Minutes/Week): 300, Intensity: Moderate, Exercise limited by: None identified   Goals Addressed             This Visit's Progress    Patient Stated   On track    Continue healthy diet and exercise habits.     Prevent falls   On track      Depression Screen PHQ 2/9 Scores 06/06/2021 06/03/2021 11/29/2020 06/04/2020 05/31/2020 01/29/2020 10/25/2019  PHQ - 2 Score 1 0 0 0 0 0 0  PHQ- 9 Score - - - - 0 - -    Fall Risk Fall Risk  06/06/2021 06/03/2021 11/29/2020 06/04/2020 05/31/2020  Falls in the past year? 0 0 0 0 0  Number falls in past yr: 0 - - - -  Injury with Fall? 0 - - - -  Risk for fall due to : No Fall Risks - - - -  Follow up Falls prevention discussed - - - -    FALL RISK PREVENTION PERTAINING TO THE HOME:  Any stairs in or around the home? Yes  If  so, are there any without handrails? No  Home free of loose throw rugs in walkways, pet beds, electrical cords, etc? Yes  Adequate lighting in your home to reduce  risk of falls? Yes   ASSISTIVE DEVICES UTILIZED TO PREVENT FALLS:  Life alert? No  Use of a cane, walker or w/c? No  Grab bars in the bathroom? Yes  Shower chair or bench in shower? Yes  Elevated toilet seat or a handicapped toilet? Yes   TIMED UP AND GO:  Was the test performed? No . Telephonic visit  Cognitive Function: Normal cognitive status assessed by direct observation by this Nurse Health Advisor. No abnormalities found.   MMSE - Mini Mental State Exam 05/30/2018 05/25/2017 12/27/2015  Orientation to time 5 5 5   Orientation to Place 5 5 5   Registration 3 3 3   Attention/ Calculation 4 5 5   Recall 3 3 3   Language- name 2 objects 2 2 2   Language- repeat 1 1 1   Language- follow 3 step command 3 3 3   Language- read & follow direction 1 1 1   Write a sentence 1 1 1   Copy design 1 1 1   Total score 29 30 30      6CIT Screen 06/04/2020 06/01/2019  What Year? 0 points 0 points  What month? 0 points 0 points  What time? 0 points 0 points  Count back from 20 0 points 0 points  Months in reverse 0 points 0 points  Repeat phrase 0 points 0 points  Total Score 0 0    Immunizations Immunization History  Administered Date(s) Administered   Fluad Quad(high Dose 65+) 01/17/2019, 01/29/2020, 02/19/2021   Influenza Whole 11/18/2008   Influenza, High Dose Seasonal PF 02/22/2015, 03/09/2016, 04/30/2017, 02/17/2018   Influenza,inj,Quad PF,6+ Mos 04/11/2013, 04/19/2014   Moderna Sars-Covid-2 Vaccination 08/14/2019, 09/11/2019, 05/28/2020   Pneumococcal Conjugate-13 04/11/2013   Pneumococcal Polysaccharide-23 04/19/2014   Td 01/17/2019   Tdap 06/18/2008   Zoster Recombinat (Shingrix) 06/03/2021   Zoster, Live 04/30/2010    TDAP status: Up to date  Flu Vaccine status: Up to date  Pneumococcal vaccine status: Up to  date  Covid-19 vaccine status: Completed vaccines  Qualifies for Shingles Vaccine? Yes   Zostavax completed Yes   Shingrix Completed?: Yes  Screening Tests Health Maintenance  Topic Date Due   COVID-19 Vaccine (4 - Booster for Moderna series) 06/19/2021 (Originally 07/23/2020)   Hepatitis C Screening  07/25/2022 (Originally 04/09/1964)   Zoster Vaccines- Shingrix (2 of 2) 07/29/2021   FOOT EXAM  11/29/2021   HEMOGLOBIN A1C  12/01/2021   OPHTHALMOLOGY EXAM  01/29/2022   COLONOSCOPY (Pts 45-34yrs Insurance coverage will need to be confirmed)  02/22/2024   TETANUS/TDAP  01/16/2029   Pneumonia Vaccine 44+ Years old  Completed   INFLUENZA VACCINE  Completed   HPV VACCINES  Aged Out   COLON CANCER SCREENING ANNUAL FOBT  Discontinued    Health Maintenance  There are no preventive care reminders to display for this patient.  Colorectal cancer screening: Type of screening: Colonoscopy. Completed 02/22/2019. Repeat every 5 years  Lung Cancer Screening: (Low Dose CT Chest recommended if Age 34-80 years, 30 pack-year currently smoking OR have quit w/in 15years.) does not qualify.  Additional Screening:  Hepatitis C Screening: does qualify; Due  Vision Screening: Recommended annual ophthalmology exams for early detection of glaucoma and other disorders of the eye. Is the patient up to date with their annual eye exam?  Yes  Who is the provider or what is the name of the office in which the patient attends annual eye exams? [place] If pt is not established with a provider, would they like to be referred  to a provider to establish care? No .   Dental Screening: Recommended annual dental exams for proper oral hygiene  Community Resource Referral / Chronic Care Management: CRR required this visit?  No   CCM required this visit?  No      Plan:     I have personally reviewed and noted the following in the patients chart:   Medical and social history Use of alcohol,  tobacco or illicit drugs  Current medications and supplements including opioid prescriptions. Patient is not currently taking opioid prescriptions. Functional ability and status Nutritional status Physical activity Advanced directives List of other physicians Hospitalizations, surgeries, and ER visits in previous 12 months Vitals Screenings to include cognitive, depression, and falls Referrals and appointments  In addition, I have reviewed and discussed with patient certain preventive protocols, quality metrics, and best practice recommendations. A written personalized care plan for preventive services as well as general preventive health recommendations were provided to patient.     Sandrea Hammond, LPN   05/18/2079   Nurse Notes: None

## 2021-06-10 DIAGNOSIS — Z20822 Contact with and (suspected) exposure to covid-19: Secondary | ICD-10-CM | POA: Diagnosis not present

## 2021-06-25 DIAGNOSIS — Z20822 Contact with and (suspected) exposure to covid-19: Secondary | ICD-10-CM | POA: Diagnosis not present

## 2021-06-30 ENCOUNTER — Other Ambulatory Visit: Payer: Self-pay | Admitting: Family Medicine

## 2021-06-30 DIAGNOSIS — B350 Tinea barbae and tinea capitis: Secondary | ICD-10-CM

## 2021-07-15 DIAGNOSIS — Z20822 Contact with and (suspected) exposure to covid-19: Secondary | ICD-10-CM | POA: Diagnosis not present

## 2021-07-21 DIAGNOSIS — Z20822 Contact with and (suspected) exposure to covid-19: Secondary | ICD-10-CM | POA: Diagnosis not present

## 2021-07-24 DIAGNOSIS — Z20822 Contact with and (suspected) exposure to covid-19: Secondary | ICD-10-CM | POA: Diagnosis not present

## 2021-07-28 ENCOUNTER — Other Ambulatory Visit: Payer: Self-pay | Admitting: Family Medicine

## 2021-07-28 DIAGNOSIS — D508 Other iron deficiency anemias: Secondary | ICD-10-CM

## 2021-08-01 DIAGNOSIS — Z20822 Contact with and (suspected) exposure to covid-19: Secondary | ICD-10-CM | POA: Diagnosis not present

## 2021-08-05 DIAGNOSIS — Z20822 Contact with and (suspected) exposure to covid-19: Secondary | ICD-10-CM | POA: Diagnosis not present

## 2021-08-11 DIAGNOSIS — Z20822 Contact with and (suspected) exposure to covid-19: Secondary | ICD-10-CM | POA: Diagnosis not present

## 2021-08-15 DIAGNOSIS — U071 COVID-19: Secondary | ICD-10-CM | POA: Diagnosis not present

## 2021-08-16 DIAGNOSIS — Z20822 Contact with and (suspected) exposure to covid-19: Secondary | ICD-10-CM | POA: Diagnosis not present

## 2021-08-22 DIAGNOSIS — U071 COVID-19: Secondary | ICD-10-CM | POA: Diagnosis not present

## 2021-11-13 ENCOUNTER — Encounter: Payer: Self-pay | Admitting: *Deleted

## 2021-11-13 ENCOUNTER — Ambulatory Visit: Payer: Self-pay | Admitting: *Deleted

## 2021-11-13 NOTE — Patient Instructions (Signed)
Visit Information  Thank you for taking time to visit with me today. Please don't hesitate to contact me if I can be of assistance to you.   Please call the care guide team at [phone] if you need to cancel or reschedule your appointment.   If you are experiencing a Mental Health or Behavioral Health Crisis or need someone to talk to, please call the Suicide and Crisis Lifeline: 988 call the [place] National Suicide Prevention Lifeline: 1-800-273-8255 or TTY: 1-800-799-4 TTY (1-800-799-4889) to talk to a trained counselor call 1-800-273-TALK (toll free, 24 hour hotline) go to [facility] [address], [place] ([phone]) call the [place] Crisis Line: 800-939-9988 call 911  Patient verbalizes understanding of instructions and care plan provided today and agrees to view in MyChart. Active MyChart status and patient understanding of how to access instructions and care plan via MyChart confirmed with patient.     No further follow up required.  Joevon Holliman, BSW, MSW, LCSW  Licensed Clinical Social Worker  [facility]  Mailing Address-[address], [place], [place] [address] Physical Address-[address], ,  [address] Toll Free Main # [phone] Fax # [phone] Cell # [phone] [email or web address]            

## 2021-11-13 NOTE — Patient Outreach (Signed)
  Care Coordination   Initial Visit Note   11/13/2021 Name: Matthew Daugherty MRN: [id number] DOB: 1946-02-25  Matthew Daugherty is a 76 y.o. year old male who sees Janora Norlander, DO for primary care. I spoke with  Marcelline Deist by phone today.  What matters to the patients health and wellness today?  No Interventions Indicated.   Goals Addressed   None     SDOH assessments and interventions completed:   Yes SDOH Interventions Today    Flowsheet Row Most Recent Value  SDOH Interventions   Food Insecurity Interventions Intervention Not Indicated  Financial Strain Interventions Intervention Not Indicated  Housing Interventions Intervention Not Indicated  Physical Activity Interventions Intervention Not Indicated  Stress Interventions Intervention Not Indicated  Social Connections Interventions Intervention Not Indicated  Transportation Interventions Intervention Not Indicated       Care Coordination Interventions Activated:  No  Care Coordination Interventions:  No, not indicated.  Follow up plan: No further intervention required.  Encounter Outcome:  Pt. Visit Completed.  Nat Christen, BSW, MSW, LCSW  Licensed Education officer, [facility]  Mailing [place] [address] [place], [place] [address] Physical Address-[address] [place], [place] [address] Toll Free Main # [phone] Fax # [phone] Cell # [phone] [email or web address]

## 2021-12-01 ENCOUNTER — Encounter: Payer: Self-pay | Admitting: Family Medicine

## 2021-12-01 ENCOUNTER — Ambulatory Visit (INDEPENDENT_AMBULATORY_CARE_PROVIDER_SITE_OTHER): Payer: Medicare Other | Admitting: Family Medicine

## 2021-12-01 VITALS — BP 123/67 | HR 73 | Temp 97.8°F | Ht 70.0 in | Wt 164.6 lb

## 2021-12-01 DIAGNOSIS — I152 Hypertension secondary to endocrine disorders: Secondary | ICD-10-CM | POA: Diagnosis not present

## 2021-12-01 DIAGNOSIS — Z23 Encounter for immunization: Secondary | ICD-10-CM | POA: Diagnosis not present

## 2021-12-01 DIAGNOSIS — E785 Hyperlipidemia, unspecified: Secondary | ICD-10-CM

## 2021-12-01 DIAGNOSIS — E1169 Type 2 diabetes mellitus with other specified complication: Secondary | ICD-10-CM | POA: Diagnosis not present

## 2021-12-01 DIAGNOSIS — E039 Hypothyroidism, unspecified: Secondary | ICD-10-CM | POA: Diagnosis not present

## 2021-12-01 DIAGNOSIS — E1159 Type 2 diabetes mellitus with other circulatory complications: Secondary | ICD-10-CM | POA: Diagnosis not present

## 2021-12-01 NOTE — Patient Instructions (Addendum)
You had labs performed today.  You will be contacted with the results of the labs once they are available, usually in the next 3 business days for routine lab work.  If you have an active my chart account, they will be released to your MyChart.  If you prefer to have these labs released to you via telephone, please let us know.   Flu shots will be available in October.  Please schedule your flu shot.

## 2021-12-01 NOTE — Progress Notes (Signed)
Subjective: CC:DM PCP: Matthew Norlander, DO HPI:Matthew Daugherty is a 76 y.o. male presenting to clinic today for:  1. Type 2 Diabetes with hypertension, hyperlipidemia:  Patient is compliant with all medications.  He reports he is really doing well.  Continues to worry about his wife, who is suffering from atrial fibrillation despite multiple cardioversions.  Last eye exam: UTD Last foot exam: needs Last A1c:  Lab Results  Component Value Date   HGBA1C 7.3 (H) 06/03/2021   Nephropathy screen indicated?: UTD Last flu, zoster and/or pneumovax:  Immunization History  Administered Date(s) Administered   Fluad Quad(high Dose 65+) 01/17/2019, 01/29/2020, 02/19/2021   Influenza Whole 11/18/2008   Influenza, High Dose Seasonal PF 02/22/2015, 03/09/2016, 04/30/2017, 02/17/2018   Influenza,inj,Quad PF,6+ Mos 04/11/2013, 04/19/2014   Moderna Sars-Covid-2 Vaccination 08/14/2019, 09/11/2019, 05/28/2020   Pneumococcal Conjugate-13 04/11/2013   Pneumococcal Polysaccharide-23 04/19/2014   Td 01/17/2019   Tdap 06/18/2008   Zoster Recombinat (Shingrix) 06/03/2021   Zoster, Live 04/30/2010    ROS: Denies dizziness, LOC, polyuria, polydipsia, unintended weight loss/gain, foot ulcerations, numbness or tingling in extremities, shortness of breath or chest pain.  2. Hypothyroidism Compliant with thyroid replacement.  No reports of difficulty swallowing, tremor or change in bowel habits    ROS: Per HPI  Allergies  Allergen Reactions   Niaspan [Niacin Er] Other (See Comments)    Increases blood sugars    Past Medical History:  Diagnosis Date   Cardiac dysrhythmia, unspecified    Diabetes mellitus without complication ([place])    Essential hypertension, benign    Hyperplasia of prostate    Other and unspecified hyperlipidemia    Thyroid disease    Unspecified transient cerebral ischemia     Current Outpatient Medications:    aspirin 81 MG EC tablet, Take 81 mg by mouth daily., Disp:  , Rfl:    atorvastatin (LIPITOR) 80 MG tablet, Take 1 tablet (80 mg total) by mouth daily., Disp: 90 tablet, Rfl: 3   Cholecalciferol (VITAMIN D-3) 5000 UNITS TABS, Take 1 tablet by mouth daily., Disp: , Rfl:    FeFum-FePoly-FA-B Cmp-C-Biot (INTEGRA PLUS) CAPS, TAKE 1 CAPSULE EVERY DAY, Disp: 90 capsule, Rfl: 1   fenofibrate 160 MG tablet, Take 1 tablet (160 mg total) by mouth daily., Disp: 90 tablet, Rfl: 3   ketoconazole (NIZORAL) 2 % cream, APPLY 1 APPLICATION TOPICALLY DAILY. TO RASH ON MUSTACHE AREA UNTIL RASH RESOLVED (2-4 WEEKS), Disp: 60 g, Rfl: 0   levothyroxine (SYNTHROID) 100 MCG tablet, TAKE 1 TABLET EVERY DAY BEFORE BREAKFAST, Disp: 90 tablet, Rfl: 2   losartan-hydrochlorothiazide (HYZAAR) 100-25 MG tablet, Take 1 tablet by mouth daily., Disp: 90 tablet, Rfl: 3   metFORMIN (GLUCOPHAGE) 1000 MG tablet, TAKE 1 TABLET TWICE DAILY WITH MEALS, Disp: 180 tablet, Rfl: 3   Multiple Vitamin (MULTIVITAMIN WITH MINERALS) TABS tablet, Take 1 tablet by mouth daily., Disp: , Rfl:  Social History   Socioeconomic History   Marital status: Married    Spouse name: Matthew Daugherty   Number of children: 1   Years of education: 12   Highest education level: High school graduate  Occupational History   Occupation: retired    Fish farm manager: [employer]  Tobacco Use   Smoking status: Former    Packs/day: 1.00    Years: 44.00    Total pack years: 44.00    Types: Cigarettes    Start date: 04/20/1954    Quit date: 06/13/1998    Years since quitting: 23.4    Passive exposure:  Past   Smokeless tobacco: Never  Vaping Use   Vaping Use: Never used  Substance and Sexual Activity   Alcohol use: No   Drug use: No   Sexual activity: Yes  Other Topics Concern   Not on file  Social History Narrative   Patient lives with his wife in a 2 story home.  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  He has one son, and 2 grand daughters.   Social Determinants of Health   Financial  Resource Strain: Low Risk  (11/13/2021)   Overall Financial Resource Strain (CARDIA)    Difficulty of Paying Living Expenses: Not hard at all  Food Insecurity: No Food Insecurity (11/13/2021)   Hunger Vital Sign    Worried About Running Out of Food in the Last Year: Never true    Ran Out of Food in the Last Year: Never true  Transportation Needs: No Transportation Needs (11/13/2021)   PRAPARE - Hydrologist (Medical): No    Lack of Transportation (Non-Medical): No  Physical Activity: Sufficiently Active (11/13/2021)   Exercise Vital Sign    Days of Exercise per Week: 5 days    Minutes of Exercise per Session: 60 min  Stress: No Stress Concern Present (11/13/2021)   [place]    Feeling of Stress : Not at all  Social Connections: [place] (11/13/2021)   Social Connection and Isolation Panel [NHANES]    Frequency of Communication with Friends and Family: More than three times a week    Frequency of Social Gatherings with Friends and Family: More than three times a week    Attends Religious Services: More than 4 times per year    Active Member of [employer] or Organizations: Yes    Attends Music therapist: More than 4 times per year    Marital Status: Married  Human resources officer Violence: Not At Risk (11/13/2021)   Humiliation, Afraid, Rape, and Kick questionnaire    Fear of Current or Ex-Partner: No    Emotionally Abused: No    Physically Abused: No    Sexually Abused: No   Family History  Problem Relation Age of Onset   Cancer Mother        bone   Asthma Father    Heart attack Father        96    Objective: Office vital signs reviewed. BP 123/67   Pulse 73   Temp 97.8 F (36.6 C)   Ht '5\' 10"'$  (1.778 m)   Wt 164 lb 9.6 oz (74.7 kg)   SpO2 99%   BMI 23.62 kg/m   Physical Examination:  General: Awake, alert, well nourished, No acute distress HEENT: Sclera white.   Moist mucous membranes.  No exophthalmos.  No goiter Cardio: regular rate and rhythm, S1S2 heard, no murmurs appreciated Pulm: clear to auscultation bilaterally, no wheezes, rhonchi or rales; normal work of breathing on room air Skin: dry; intact; no rashes or lesions Neuro:see DM foot Diabetic Foot Exam - Simple   Simple Foot Form Diabetic Foot exam was performed with the following findings: Yes 12/01/2021  8:17 AM  Visual Inspection No deformities, no ulcerations, no other skin breakdown bilaterally: Yes Sensation Testing Intact to touch and monofilament testing bilaterally: Yes Pulse Check Posterior Tibialis and Dorsalis pulse intact bilaterally: Yes Comments Slight scaling of the plantar surfaces of the feet but otherwise no abnormalities observed  Assessment/ Plan: 76 y.o. male   Controlled type 2 diabetes mellitus with other specified complication, without long-term current use of insulin ([place]) - Plan: Bayer DCA Hb A1c Waived  Hypertension associated with type 2 diabetes mellitus ([place]) - Plan: Renal Function Panel  Hyperlipidemia associated with type 2 diabetes mellitus ([place])  Acquired hypothyroidism - Plan: TSH, T4, Free  Hypercalcemia - Plan: VITAMIN D 25 Hydroxy (Vit-D Deficiency, Fractures)  Need for shingles vaccine - Plan: Zoster Recombinant (Shingrix )  Check A1c, renal function panel given slight elevation in calcium level.  Vitamin D also ordered.  Not yet due for fasting lipid but will obtain at next visit  Check thyroid levels.  He is asymptomatic from a thyroid standpoint  Second shingles vaccination administered today.  Plan for flu shot this fall  No orders of the defined types were placed in this encounter.  No orders of the defined types were placed in this encounter.    Matthew Norlander, DO [place] [phone]

## 2021-12-02 ENCOUNTER — Other Ambulatory Visit: Payer: Self-pay

## 2021-12-02 DIAGNOSIS — E039 Hypothyroidism, unspecified: Secondary | ICD-10-CM

## 2021-12-02 LAB — RENAL FUNCTION PANEL
Albumin: 4.9 g/dL — ABNORMAL HIGH (ref 3.8–4.8)
BUN/Creatinine Ratio: 13 (ref 10–24)
BUN: 16 mg/dL (ref 8–27)
CO2: 22 mmol/L (ref 20–29)
Calcium: 10.3 mg/dL — ABNORMAL HIGH (ref 8.6–10.2)
Chloride: 100 mmol/L (ref 96–106)
Creatinine, Ser: 1.27 mg/dL (ref 0.76–1.27)
Glucose: 135 mg/dL — ABNORMAL HIGH (ref 70–99)
Phosphorus: 3.3 mg/dL (ref 2.8–4.1)
Potassium: 4.9 mmol/L (ref 3.5–5.2)
Sodium: 138 mmol/L (ref 134–144)
eGFR: 60 mL/min/{1.73_m2} (ref >59)

## 2021-12-02 LAB — VITAMIN D 25 HYDROXY (VIT D DEFICIENCY, FRACTURES): Vit D, 25-Hydroxy: 63.7 ng/mL (ref 30.0–100.0)

## 2021-12-02 LAB — T4, FREE: Free T4: 1.98 ng/dL — ABNORMAL HIGH (ref 0.82–1.77)

## 2021-12-02 LAB — TSH: TSH: 1.63 u[IU]/mL (ref 0.450–4.500)

## 2022-01-06 ENCOUNTER — Other Ambulatory Visit: Payer: Medicare Other

## 2022-01-06 DIAGNOSIS — E039 Hypothyroidism, unspecified: Secondary | ICD-10-CM

## 2022-01-07 LAB — T4, FREE: Free T4: 1.61 ng/dL (ref 0.82–1.77)

## 2022-01-07 LAB — TSH: TSH: 2.62 u[IU]/mL (ref 0.450–4.500)

## 2022-01-23 DIAGNOSIS — H40033 Anatomical narrow angle, bilateral: Secondary | ICD-10-CM | POA: Diagnosis not present

## 2022-01-23 DIAGNOSIS — E119 Type 2 diabetes mellitus without complications: Secondary | ICD-10-CM | POA: Diagnosis not present

## 2022-02-23 DIAGNOSIS — Z23 Encounter for immunization: Secondary | ICD-10-CM | POA: Diagnosis not present

## 2022-03-17 ENCOUNTER — Other Ambulatory Visit: Payer: Self-pay | Admitting: Family Medicine

## 2022-03-17 DIAGNOSIS — E039 Hypothyroidism, unspecified: Secondary | ICD-10-CM

## 2022-03-17 DIAGNOSIS — D508 Other iron deficiency anemias: Secondary | ICD-10-CM

## 2022-04-20 ENCOUNTER — Other Ambulatory Visit: Payer: Self-pay | Admitting: Family Medicine

## 2022-04-20 DIAGNOSIS — I700 Atherosclerosis of aorta: Secondary | ICD-10-CM

## 2022-04-20 DIAGNOSIS — E1169 Type 2 diabetes mellitus with other specified complication: Secondary | ICD-10-CM

## 2022-06-01 ENCOUNTER — Other Ambulatory Visit: Payer: Self-pay | Admitting: Family Medicine

## 2022-06-01 DIAGNOSIS — I152 Hypertension secondary to endocrine disorders: Secondary | ICD-10-CM

## 2022-06-01 DIAGNOSIS — D508 Other iron deficiency anemias: Secondary | ICD-10-CM

## 2022-06-05 ENCOUNTER — Encounter: Payer: Self-pay | Admitting: Family Medicine

## 2022-06-05 ENCOUNTER — Ambulatory Visit (INDEPENDENT_AMBULATORY_CARE_PROVIDER_SITE_OTHER): Payer: Medicare Other | Admitting: Family Medicine

## 2022-06-05 VITALS — BP 154/82 | HR 70 | Temp 98.7°F | Ht 70.0 in | Wt 165.0 lb

## 2022-06-05 DIAGNOSIS — N400 Enlarged prostate without lower urinary tract symptoms: Secondary | ICD-10-CM

## 2022-06-05 DIAGNOSIS — E1169 Type 2 diabetes mellitus with other specified complication: Secondary | ICD-10-CM

## 2022-06-05 DIAGNOSIS — E1159 Type 2 diabetes mellitus with other circulatory complications: Secondary | ICD-10-CM

## 2022-06-05 DIAGNOSIS — E039 Hypothyroidism, unspecified: Secondary | ICD-10-CM | POA: Diagnosis not present

## 2022-06-05 DIAGNOSIS — I152 Hypertension secondary to endocrine disorders: Secondary | ICD-10-CM

## 2022-06-05 DIAGNOSIS — I700 Atherosclerosis of aorta: Secondary | ICD-10-CM | POA: Diagnosis not present

## 2022-06-05 DIAGNOSIS — E785 Hyperlipidemia, unspecified: Secondary | ICD-10-CM | POA: Diagnosis not present

## 2022-06-05 LAB — BAYER DCA HB A1C WAIVED: HB A1C (BAYER DCA - WAIVED): 7.6 % — ABNORMAL HIGH (ref 4.8–5.6)

## 2022-06-05 MED ORDER — METFORMIN HCL 1000 MG PO TABS: 1000.0000 mg | ORAL_TABLET | Freq: Two times a day (BID) | ORAL | 3 refills | Status: DC

## 2022-06-05 MED ORDER — FENOFIBRATE 160 MG PO TABS: 160.0000 mg | ORAL_TABLET | Freq: Every day | ORAL | 3 refills | Status: DC

## 2022-06-05 MED ORDER — LOSARTAN POTASSIUM-HCTZ 100-25 MG PO TABS: 1.0000 | ORAL_TABLET | Freq: Every day | ORAL | 3 refills | Status: DC

## 2022-06-05 MED ORDER — ATORVASTATIN CALCIUM 80 MG PO TABS: 80.0000 mg | ORAL_TABLET | Freq: Every day | ORAL | 3 refills | Status: DC

## 2022-06-05 NOTE — Patient Instructions (Signed)

## 2022-06-05 NOTE — Progress Notes (Signed)
Matthew Daugherty is a 77 y.o. male presents to office today for annual physical exam examination.    Concerns today include: 1. Type 2 Diabetes with hypertension, hyperlipidemia:  He reports that he has been doing well.  Tries to stay physically active but is not as active as he is during the springtime.  He is compliant with his Lipitor, fenofibrate, Hyzaar and metformin 1000 mg twice daily.  No hypoglycemic episodes reported.  Last eye exam: Done with Dr Marin Comment.  Vision has been stable x 3 years.  Wears glasses Last foot exam: UTD Last A1c:  Lab Results  Component Value Date   HGBA1C 7.3 (H) 06/03/2021   Nephropathy screen indicated?: needs Last flu, zoster and/or pneumovax:  Immunization History  Administered Date(s) Administered   Fluad Quad(high Dose 65+) 01/17/2019, 01/29/2020, 02/19/2021, 02/23/2022   Influenza Whole 11/18/2008   Influenza, High Dose Seasonal PF 02/22/2015, 03/09/2016, 04/30/2017, 02/17/2018   Influenza,inj,Quad PF,6+ Mos 04/11/2013, 04/19/2014   Moderna Sars-Covid-2 Vaccination 08/14/2019, 09/11/2019, 05/28/2020   Pneumococcal Conjugate-13 04/11/2013   Pneumococcal Polysaccharide-23 04/19/2014   Td 01/17/2019   Tdap 06/18/2008   Zoster Recombinat (Shingrix) 06/03/2021, 12/01/2021   Zoster, Live 04/30/2010    ROS: Denies dizziness, LOC, polyuria, polydipsia, unintended weight loss/gain, foot ulcerations, numbness or tingling in extremities, shortness of breath or chest pain.   Occupation: retired, Marital status: married to [place], Substance use: none Diet: typical [place], Exercise: walks Last eye exam: UTD Last colonoscopy: UTD Refills needed today: none Immunizations needed: Immunization History  Administered Date(s) Administered   Fluad Quad(high Dose 65+) 01/17/2019, 01/29/2020, 02/19/2021, 02/23/2022   Influenza Whole 11/18/2008   Influenza, High Dose Seasonal PF 02/22/2015, 03/09/2016, 04/30/2017, 02/17/2018   Influenza,inj,Quad PF,6+ Mos  04/11/2013, 04/19/2014   Moderna Sars-Covid-2 Vaccination 08/14/2019, 09/11/2019, 05/28/2020   Pneumococcal Conjugate-13 04/11/2013   Pneumococcal Polysaccharide-23 04/19/2014   Td 01/17/2019   Tdap 06/18/2008   Zoster Recombinat (Shingrix) 06/03/2021, 12/01/2021   Zoster, Live 04/30/2010     Past Medical History:  Diagnosis Date   Cardiac dysrhythmia, unspecified    Diabetes mellitus without complication (HCC)    Essential hypertension, benign    Hyperplasia of prostate    Other and unspecified hyperlipidemia    Thyroid disease    Unspecified transient cerebral ischemia    Social History   Socioeconomic History   Marital status: Married    Spouse name: [name]   Number of children: 1   Years of education: 12   Highest education level: High school graduate  Occupational History   Occupation: retired    Fish farm manager: [employer]  Tobacco Use   Smoking status: Former    Packs/day: 1.00    Years: 44.00    Total pack years: 44.00    Types: Cigarettes    Start date: 04/20/1954    Quit date: 06/13/1998    Years since quitting: 23.9    Passive exposure: Past   Smokeless tobacco: Never  Vaping Use   Vaping Use: Never used  Substance and Sexual Activity   Alcohol use: No   Drug use: No   Sexual activity: Yes  Other Topics Concern   Not on file  Social History Narrative   Patient lives with his wife in a 2 story home.  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  He has one son, and 2 grand daughters.   Social Determinants of Health   Financial Resource Strain: Low Risk  (11/13/2021)   Overall Financial  Resource Strain (CARDIA)    Difficulty of Paying Living Expenses: Not hard at all  Food Insecurity: No Food Insecurity (11/13/2021)   Hunger Vital Sign    Worried About Running Out of Food in the Last Year: Never true    Ran Out of Food in the Last Year: Never true  Transportation Needs: No Transportation Needs (11/13/2021)   PRAPARE -  Hydrologist (Medical): No    Lack of Transportation (Non-Medical): No  Physical Activity: Sufficiently Active (11/13/2021)   Exercise Vital Sign    Days of Exercise per Week: 5 days    Minutes of Exercise per Session: 60 min  Stress: No Stress Concern Present (11/13/2021)   [place]    Feeling of Stress : Not at all  Social Connections: [place] (11/13/2021)   Social Connection and Isolation Panel [NHANES]    Frequency of Communication with Friends and Family: More than three times a week    Frequency of Social Gatherings with Friends and Family: More than three times a week    Attends Religious Services: More than 4 times per year    Active Member of [employer] or Organizations: Yes    Attends Music therapist: More than 4 times per year    Marital Status: Married  Human resources officer Violence: Not At Risk (11/13/2021)   Humiliation, Afraid, Rape, and Kick questionnaire    Fear of Current or Ex-Partner: No    Emotionally Abused: No    Physically Abused: No    Sexually Abused: No   Past Surgical History:  Procedure Laterality Date   APPENDECTOMY     CHOLECYSTECTOMY     LAPAROSCOPIC CHOLECYSTECTOMY SINGLE SITE WITH INTRAOPERATIVE CHOLANGIOGRAM N/A 06/22/2015   Procedure: LAPAROSCOPIC CHOLECYSTECTOMY SINGLE SITE WITH INTRAOPERATIVE CHOLANGIOGRAM, PRIMARY UMBILICAL HERNIA REPAIR;  Surgeon: Michael Boston, MD;  Location: [facility];  Service: General;  Laterality: N/A;   Family History  Problem Relation Age of Onset   Cancer Mother        bone   Asthma Father    Heart attack Father        42    Current Outpatient Medications:    aspirin 81 MG EC tablet, Take 81 mg by mouth daily., Disp: , Rfl:    atorvastatin (LIPITOR) 80 MG tablet, TAKE 1 TABLET (80 MG TOTAL) BY MOUTH DAILY., Disp: 90 tablet, Rfl: 0   Cholecalciferol (VITAMIN D-3) 5000 UNITS TABS, Take 1 tablet by mouth  daily., Disp: , Rfl:    FeFum-FePoly-FA-B Cmp-C-Biot (INTEGRA PLUS) CAPS, TAKE 1 CAPSULE EVERY DAY, Disp: 90 capsule, Rfl: 0   fenofibrate 160 MG tablet, TAKE 1 TABLET (160 MG TOTAL) BY MOUTH DAILY., Disp: 90 tablet, Rfl: 0   ketoconazole (NIZORAL) 2 % cream, APPLY 1 APPLICATION TOPICALLY DAILY. TO RASH ON MUSTACHE AREA UNTIL RASH RESOLVED (2-4 WEEKS), Disp: 60 g, Rfl: 0   levothyroxine (SYNTHROID) 100 MCG tablet, TAKE 1 TABLET EVERY DAY BEFORE BREAKFAST, Disp: 90 tablet, Rfl: 2   losartan-hydrochlorothiazide (HYZAAR) 100-25 MG tablet, TAKE 1 TABLET EVERY DAY, Disp: 90 tablet, Rfl: 0   metFORMIN (GLUCOPHAGE) 1000 MG tablet, TAKE 1 TABLET TWICE DAILY WITH MEALS, Disp: 180 tablet, Rfl: 0   Multiple Vitamin (MULTIVITAMIN WITH MINERALS) TABS tablet, Take 1 tablet by mouth daily., Disp: , Rfl:   Allergies  Allergen Reactions   Niaspan [Niacin Er] Other (See Comments)    Increases blood sugars  ROS: Review of Systems A comprehensive review of systems was negative except for: Eyes: positive for contacts/glasses Integument/breast: positive for benign lentigo    Physical exam BP (!) 157/80   Pulse 70   Temp 98.7 F (37.1 C)   Ht 5' 10"$  (1.778 m)   Wt 165 lb (74.8 kg)   SpO2 98%   BMI 23.68 kg/m  General appearance: alert, cooperative, appears stated age, and no distress Head: Normocephalic, without obvious abnormality, atraumatic Eyes: negative findings: lids and lashes normal, conjunctivae and sclerae normal, corneas clear, and pupils equal, round, reactive to light and accomodation Ears: normal TM's and external ear canals both ears Nose: Nares normal. Septum midline. Mucosa normal. No drainage or sinus tenderness. Throat: lips, mucosa, and tongue normal; teeth and gums normal Neck: no adenopathy, supple, symmetrical, trachea midline, and thyroid not enlarged, symmetric, no tenderness/mass/nodules Back: symmetric, no curvature. ROM normal. No CVA tenderness. Lungs: clear to  auscultation bilaterally Chest wall: no tenderness Heart: regular rate and rhythm, S1, S2 normal, no murmur, click, rub or gallop Abdomen: soft, non-tender; bowel sounds normal; no masses,  no organomegaly Extremities: extremities normal, atraumatic, no cyanosis or edema Pulses: 2+ and symmetric Skin:  Solar lentigo noted along bilateral upper extremities Lymph nodes: Cervical, supraclavicular, and axillary nodes normal. Neurologic: Grossly normal Psych: Mood stable, speech normal, affect appropriate.  Very pleasant and interactive     06/05/2022    8:11 AM 12/01/2021    8:00 AM 11/13/2021    6:08 PM  Depression screen PHQ 2/9  Decreased Interest 0 0 0  Down, Depressed, Hopeless 0 0 0  PHQ - 2 Score 0 0 0  Altered sleeping 0 0   Tired, decreased energy 0 0   Change in appetite 0 0   Feeling bad or failure about yourself  0 0   Trouble concentrating 0 0   Moving slowly or fidgety/restless 0 0   Suicidal thoughts 0 0   PHQ-9 Score 0 0   Difficult doing work/chores Not difficult at all Not difficult at all     Assessment/ Plan: Marcelline Deist here for annual physical exam.   Type 2 diabetes mellitus with other specified complication, without long-term current use of insulin ([place]) - Plan: Microalbumin / creatinine urine ratio, Bayer DCA Hb A1c Waived, metFORMIN (GLUCOPHAGE) 1000 MG tablet  Hypertension associated with type 2 diabetes mellitus ([place]) - Plan: CMP14+EGFR, losartan-hydrochlorothiazide (HYZAAR) 100-25 MG tablet  Hyperlipidemia associated with type 2 diabetes mellitus (HCC) - Plan: CMP14+EGFR, Lipid Panel, atorvastatin (LIPITOR) 80 MG tablet, fenofibrate 160 MG tablet  Aortic atherosclerosis (HCC) - Plan: CBC with Differential, Lipid Panel, atorvastatin (LIPITOR) 80 MG tablet, fenofibrate 160 MG tablet  Benign prostatic hyperplasia without lower urinary tract symptoms - Plan: CBC with Differential, PSA  Acquired hypothyroidism - Plan: CBC with Differential, T4, free,  TSH  Sugar now uncontrolled A1c rising to 7.6.  I believe he can get this down with diet modification so we will hold off on adding any new medications today and plan for close follow-up in 3 months.  Urine microalbumin collected.  Release of information form for diabetic eye exam completed  Blood pressure is not at goal.  He will follow-up in the next 2 weeks for blood pressure recheck with nurse.  If persistently elevated, we may need to consider adding low-dose Norvasc as he is on max dose Hyzaar  Check fasting lipid, CMP.  Continue statin at max dose, fenofibrate.  He has known aortic atherosclerosis  Asymptomatic from a BPH standpoint.  Check PSA  Asymptomatic from a thyroid standpoint.  Check thyroid levels.  Continue Synthroid as prescribed  Counseled on healthy lifestyle choices, including diet (rich in fruits, vegetables and lean meats and low in salt and simple carbohydrates) and exercise (at least 30 minutes of moderate physical activity daily).   Veronia Laprise M. Lajuana Ripple, DO

## 2022-06-06 LAB — CBC WITH DIFFERENTIAL/PLATELET
Basophils Absolute: 0.1 10*3/uL (ref 0.0–0.2)
Basos: 1 %
EOS (ABSOLUTE): 0 10*3/uL (ref 0.0–0.4)
Eos: 1 %
Hematocrit: 40.3 % (ref 37.5–51.0)
Hemoglobin: 13.1 g/dL (ref 13.0–17.7)
Immature Grans (Abs): 0 10*3/uL (ref 0.0–0.1)
Immature Granulocytes: 1 %
Lymphocytes Absolute: 2.2 10*3/uL (ref 0.7–3.1)
Lymphs: 37 %
MCH: 30.4 pg (ref 26.6–33.0)
MCHC: 32.5 g/dL (ref 31.5–35.7)
MCV: 94 fL (ref 79–97)
Monocytes Absolute: 0.4 10*3/uL (ref 0.1–0.9)
Monocytes: 7 %
Neutrophils Absolute: 3.2 10*3/uL (ref 1.4–7.0)
Neutrophils: 53 %
Platelets: 308 10*3/uL (ref 150–450)
RBC: 4.31 x10E6/uL (ref 4.14–5.80)
RDW: 12.2 % (ref 11.6–15.4)
WBC: 6 10*3/uL (ref 3.4–10.8)

## 2022-06-06 LAB — CMP14+EGFR
ALT: 30 IU/L (ref 0–44)
AST: 30 IU/L (ref 0–40)
Albumin/Globulin Ratio: 2.4 — ABNORMAL HIGH (ref 1.2–2.2)
Albumin: 4.8 g/dL (ref 3.8–4.8)
Alkaline Phosphatase: 69 IU/L (ref 44–121)
BUN/Creatinine Ratio: 11 (ref 10–24)
BUN: 14 mg/dL (ref 8–27)
Bilirubin Total: 0.6 mg/dL (ref 0.0–1.2)
CO2: 21 mmol/L (ref 20–29)
Calcium: 10 mg/dL (ref 8.6–10.2)
Chloride: 99 mmol/L (ref 96–106)
Creatinine, Ser: 1.29 mg/dL — ABNORMAL HIGH (ref 0.76–1.27)
Globulin, Total: 2 g/dL (ref 1.5–4.5)
Glucose: 141 mg/dL — ABNORMAL HIGH (ref 70–99)
Potassium: 4.8 mmol/L (ref 3.5–5.2)
Sodium: 138 mmol/L (ref 134–144)
Total Protein: 6.8 g/dL (ref 6.0–8.5)
eGFR: 58 mL/min/{1.73_m2} — ABNORMAL LOW (ref >59)

## 2022-06-06 LAB — LIPID PANEL
Chol/HDL Ratio: 2.2 ratio (ref 0.0–5.0)
Cholesterol, Total: 114 mg/dL (ref 100–199)
HDL: 51 mg/dL (ref >39)
LDL Chol Calc (NIH): 47 mg/dL (ref 0–99)
Triglycerides: 78 mg/dL (ref 0–149)
VLDL Cholesterol Cal: 16 mg/dL (ref 5–40)

## 2022-06-06 LAB — T4, FREE: Free T4: 1.94 ng/dL — ABNORMAL HIGH (ref 0.82–1.77)

## 2022-06-06 LAB — TSH: TSH: 3.08 u[IU]/mL (ref 0.450–4.500)

## 2022-06-06 LAB — PSA: Prostate Specific Ag, Serum: 1.5 ng/mL (ref 0.0–4.0)

## 2022-06-07 LAB — MICROALBUMIN / CREATININE URINE RATIO
Creatinine, Urine: 40.4 mg/dL
Microalb/Creat Ratio: 9 mg/g creat (ref 0–29)
Microalbumin, Urine: 3.7 ug/mL

## 2022-06-08 ENCOUNTER — Ambulatory Visit (INDEPENDENT_AMBULATORY_CARE_PROVIDER_SITE_OTHER): Payer: Medicare Other

## 2022-06-08 ENCOUNTER — Other Ambulatory Visit: Payer: Self-pay

## 2022-06-08 VITALS — Ht 71.0 in | Wt 165.0 lb

## 2022-06-08 DIAGNOSIS — E1169 Type 2 diabetes mellitus with other specified complication: Secondary | ICD-10-CM

## 2022-06-08 DIAGNOSIS — I152 Hypertension secondary to endocrine disorders: Secondary | ICD-10-CM

## 2022-06-08 DIAGNOSIS — Z0000 Encounter for general adult medical examination without abnormal findings: Secondary | ICD-10-CM

## 2022-06-08 DIAGNOSIS — R7989 Other specified abnormal findings of blood chemistry: Secondary | ICD-10-CM

## 2022-06-08 NOTE — Patient Instructions (Signed)
Matthew Daugherty , Thank you for taking time to come for your Medicare Wellness Visit. I appreciate your ongoing commitment to your health goals. Please review the following plan we discussed and let me know if I can assist you in the future.   These are the goals we discussed:  Goals      Exercise 3x per week (30 min per time)     Walk for 30 minutes at least 3 times weekly     Increase physical activity     Patient Stated     Continue healthy diet and exercise habits.     Prevent falls        This is a list of the screening recommended for you and due dates:  Health Maintenance  Topic Date Due   Eye exam for diabetics  01/29/2022   COVID-19 Vaccine (4 - 2023-24 season) 06/21/2022*   Hepatitis C Screening: USPSTF Recommendation to screen - Ages 18-79 yo.  07/25/2022*   Complete foot exam   12/02/2022   Hemoglobin A1C  12/04/2022   Yearly kidney function blood test for diabetes  06/06/2023   Yearly kidney health urinalysis for diabetes  06/06/2023   Medicare Annual Wellness Visit  06/09/2023   Colon Cancer Screening  02/22/2024   DTaP/Tdap/Td vaccine (3 - Td or Tdap) 01/16/2029   Pneumonia Vaccine  Completed   Flu Shot  Completed   Zoster (Shingles) Vaccine  Completed   HPV Vaccine  Aged Out   Stool Blood Test  Discontinued  *Topic was postponed. The date shown is not the original due date.    Advanced directives: Please bring a copy of your health care power of attorney and living will to the office to be added to your chart at your convenience.   Conditions/risks identified: Aim for 30 minutes of exercise or brisk walking, 6-8 glasses of water, and 5 servings of fruits and vegetables each day.   Next appointment: Follow up in one year for your annual wellness visit.   Preventive Care 72 Years and Older, Male  Preventive care refers to lifestyle choices and visits with your health care provider that can promote health and wellness. What does preventive care include? A yearly  physical exam. This is also called an annual well check. Dental exams once or twice a year. Routine eye exams. Ask your health care provider how often you should have your eyes checked. Personal lifestyle choices, including: Daily care of your teeth and gums. Regular physical activity. Eating a healthy diet. Avoiding tobacco and drug use. Limiting alcohol use. Practicing safe sex. Taking low doses of aspirin every day. Taking vitamin and mineral supplements as recommended by your health care provider. What happens during an annual well check? The services and screenings done by your health care provider during your annual well check will depend on your age, overall health, lifestyle risk factors, and family history of disease. Counseling  Your health care provider may ask you questions about your: Alcohol use. Tobacco use. Drug use. Emotional well-being. Home and relationship well-being. Sexual activity. Eating habits. History of falls. Memory and ability to understand (cognition). Work and work Statistician. Screening  You may have the following tests or measurements: Height, weight, and BMI. Blood pressure. Lipid and cholesterol levels. These may be checked every 5 years, or more frequently if you are over 93 years old. Skin check. Lung cancer screening. You may have this screening every year starting at age 76 if you have a 30-pack-year history of  smoking and currently smoke or have quit within the past 15 years. Fecal occult blood test (FOBT) of the stool. You may have this test every year starting at age 61. Flexible sigmoidoscopy or colonoscopy. You may have a sigmoidoscopy every 5 years or a colonoscopy every 10 years starting at age 11. Prostate cancer screening. Recommendations will vary depending on your family history and other risks. Hepatitis C blood test. Hepatitis B blood test. Sexually transmitted disease (STD) testing. Diabetes screening. This is done by  checking your blood sugar (glucose) after you have not eaten for a while (fasting). You may have this done every 1-3 years. Abdominal aortic aneurysm (AAA) screening. You may need this if you are a current or former smoker. Osteoporosis. You may be screened starting at age 31 if you are at high risk. Talk with your health care provider about your test results, treatment options, and if necessary, the need for more tests. Vaccines  Your health care provider may recommend certain vaccines, such as: Influenza vaccine. This is recommended every year. Tetanus, diphtheria, and acellular pertussis (Tdap, Td) vaccine. You may need a Td booster every 10 years. Zoster vaccine. You may need this after age 77. Pneumococcal 13-valent conjugate (PCV13) vaccine. One dose is recommended after age 60. Pneumococcal polysaccharide (PPSV23) vaccine. One dose is recommended after age 61. Talk to your health care provider about which screenings and vaccines you need and how often you need them. This information is not intended to replace advice given to you by your health care provider. Make sure you discuss any questions you have with your health care provider. Document Released: 05/03/2015 Document Revised: 12/25/2015 Document Reviewed: 02/05/2015 Elsevier Interactive Patient Education  2017 [place] Prevention in the Home Falls can cause injuries. They can happen to people of all ages. There are many things you can do to make your home safe and to help prevent falls. What can I do on the outside of my home? Regularly fix the edges of walkways and driveways and fix any cracks. Remove anything that might make you trip as you walk through a door, such as a raised step or threshold. Trim any bushes or trees on the path to your home. Use bright outdoor lighting. Clear any walking paths of anything that might make someone trip, such as rocks or tools. Regularly check to see if handrails are loose or  broken. Make sure that both sides of any steps have handrails. Any raised decks and porches should have guardrails on the edges. Have any leaves, snow, or ice cleared regularly. Use sand or salt on walking paths during winter. Clean up any spills in your garage right away. This includes oil or grease spills. What can I do in the bathroom? Use night lights. Install grab bars by the toilet and in the tub and shower. Do not use towel bars as grab bars. Use non-skid mats or decals in the tub or shower. If you need to sit down in the shower, use a plastic, non-slip stool. Keep the floor dry. Clean up any water that spills on the floor as soon as it happens. Remove soap buildup in the tub or shower regularly. Attach bath mats securely with double-sided non-slip rug tape. Do not have throw rugs and other things on the floor that can make you trip. What can I do in the bedroom? Use night lights. Make sure that you have a light by your bed that is easy to reach. Do not use  any sheets or blankets that are too big for your bed. They should not hang down onto the floor. Have a firm chair that has side arms. You can use this for support while you get dressed. Do not have throw rugs and other things on the floor that can make you trip. What can I do in the kitchen? Clean up any spills right away. Avoid walking on wet floors. Keep items that you use a lot in easy-to-reach places. If you need to reach something above you, use a strong step stool that has a grab bar. Keep electrical cords out of the way. Do not use floor polish or wax that makes floors slippery. If you must use wax, use non-skid floor wax. Do not have throw rugs and other things on the floor that can make you trip. What can I do with my stairs? Do not leave any items on the stairs. Make sure that there are handrails on both sides of the stairs and use them. Fix handrails that are broken or loose. Make sure that handrails are as long as  the stairways. Check any carpeting to make sure that it is firmly attached to the stairs. Fix any carpet that is loose or worn. Avoid having throw rugs at the top or bottom of the stairs. If you do have throw rugs, attach them to the floor with carpet tape. Make sure that you have a light switch at the top of the stairs and the bottom of the stairs. If you do not have them, ask someone to add them for you. What else can I do to help prevent falls? Wear shoes that: Do not have high heels. Have rubber bottoms. Are comfortable and fit you well. Are closed at the toe. Do not wear sandals. If you use a stepladder: Make sure that it is fully opened. Do not climb a closed stepladder. Make sure that both sides of the stepladder are locked into place. Ask someone to hold it for you, if possible. Clearly mark and make sure that you can see: Any grab bars or handrails. First and last steps. Where the edge of each step is. Use tools that help you move around (mobility aids) if they are needed. These include: Canes. Walkers. Scooters. Crutches. Turn on the lights when you go into a dark area. Replace any light bulbs as soon as they burn out. Set up your furniture so you have a clear path. Avoid moving your furniture around. If any of your floors are uneven, fix them. If there are any pets around you, be aware of where they are. Review your medicines with your doctor. Some medicines can make you feel dizzy. This can increase your chance of falling. Ask your doctor what other things that you can do to help prevent falls. This information is not intended to replace advice given to you by your health care provider. Make sure you discuss any questions you have with your health care provider. Document Released: 01/31/2009 Document Revised: 09/12/2015 Document Reviewed: 05/11/2014 Elsevier Interactive Patient Education  2017 [employer].

## 2022-06-08 NOTE — Progress Notes (Signed)
Subjective:   Matthew Daugherty is a 77 y.o. male who presents for Medicare Annual/Subsequent preventive examination. I connected with  SEELEY LETTER on 06/08/22 by a audio enabled telemedicine application and verified that I am speaking with the correct person using two identifiers.  Patient Location: Home  Provider Location: Home Office  I discussed the limitations of evaluation and management by telemedicine. The patient expressed understanding and agreed to proceed.  Review of Systems     Cardiac Risk Factors include: advanced age (>77mn, >>22women);hypertension;male gender;dyslipidemia;diabetes mellitus     Objective:    Today's Vitals   06/08/22 1419  Weight: 165 lb (74.8 kg)  Height: 5' 11"$  (1.803 m)   Body mass index is 23.01 kg/m.     06/08/2022    2:21 PM 11/13/2021    6:15 PM 06/06/2021   12:59 PM 06/04/2020    8:12 AM 06/01/2019    8:34 AM 05/30/2018    8:49 AM 05/25/2017    8:10 AM  Advanced Directives  Does Patient Have a Medical Advance Directive? Yes Yes Yes No Yes Yes No  Type of [place]of [place] will [place] will Healthcare Power of [place] will [place]  Does patient want to make changes to medical advance directive?  No - Patient declined  No - Patient declined No - Patient declined No - Patient declined   Copy of [place] Chart? No - copy requested No - copy requested No - copy requested  No - copy requested No - copy requested   Would patient like information on creating a medical advance directive?    No - Patient declined   Yes (MAU/Ambulatory/Procedural Areas - Information given)    Current Medications (verified) Outpatient Encounter Medications as of 06/08/2022  Medication Sig   aspirin 81 MG EC tablet Take 81 mg by mouth daily.   atorvastatin (LIPITOR) 80 MG tablet Take 1 tablet (80 mg total) by mouth daily.    Cholecalciferol (VITAMIN D-3) 5000 UNITS TABS Take 1 tablet by mouth daily.   FeFum-FePoly-FA-B Cmp-C-Biot (INTEGRA PLUS) CAPS TAKE 1 CAPSULE EVERY DAY   fenofibrate 160 MG tablet Take 1 tablet (160 mg total) by mouth daily.   ketoconazole (NIZORAL) 2 % cream APPLY 1 APPLICATION TOPICALLY DAILY. TO RASH ON MUSTACHE AREA UNTIL RASH RESOLVED (2-4 WEEKS)   levothyroxine (SYNTHROID) 100 MCG tablet TAKE 1 TABLET EVERY DAY BEFORE BREAKFAST   losartan-hydrochlorothiazide (HYZAAR) 100-25 MG tablet Take 1 tablet by mouth daily.   metFORMIN (GLUCOPHAGE) 1000 MG tablet Take 1 tablet (1,000 mg total) by mouth 2 (two) times daily with a meal.   Multiple Vitamin (MULTIVITAMIN WITH MINERALS) TABS tablet Take 1 tablet by mouth daily.   No facility-administered encounter medications on file as of 06/08/2022.    Allergies (verified) Niaspan [niacin er]   History: Past Medical History:  Diagnosis Date   Cardiac dysrhythmia, unspecified    Diabetes mellitus without complication ([place]    Essential hypertension, benign    Hyperplasia of prostate    Other and unspecified hyperlipidemia    Thyroid disease    Unspecified transient cerebral ischemia    Past Surgical History:  Procedure Laterality Date   APPENDECTOMY     CHOLECYSTECTOMY     LAPAROSCOPIC CHOLECYSTECTOMY SINGLE SITE WITH INTRAOPERATIVE CHOLANGIOGRAM N/A 06/22/2015   Procedure: LAPAROSCOPIC CHOLECYSTECTOMY SINGLE SITE WITH INTRAOPERATIVE CHOLANGIOGRAM, PRIMARY UMBILICAL HERNIA REPAIR;  Surgeon: SRemo Lipps  Gross, MD;  Location: [facility];  Service: General;  Laterality: N/A;   Family History  Problem Relation Age of Onset   Cancer Mother        bone   Asthma Father    Heart attack Father        31   Cirrhosis Brother    Social History   Socioeconomic History   Marital status: Married    Spouse name: [name]   Number of children: 1   Years of education: 12   Highest education level: High school graduate  Occupational History   Occupation:  retired    Fish farm manager: [employer]  Tobacco Use   Smoking status: Former    Packs/day: 1.00    Years: 44.00    Total pack years: 44.00    Types: Cigarettes    Start date: 04/20/1954    Quit date: 06/13/1998    Years since quitting: 24.0    Passive exposure: Past   Smokeless tobacco: Never  Vaping Use   Vaping Use: Never used  Substance and Sexual Activity   Alcohol use: No   Drug use: No   Sexual activity: Yes  Other Topics Concern   Not on file  Social History Narrative   Patient lives with his wife in a 2 story home.  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  He has one son, and 2 grand daughters.   Social Determinants of Health   Financial Resource Strain: Low Risk  (06/08/2022)   Overall Financial Resource Strain (CARDIA)    Difficulty of Paying Living Expenses: Not hard at all  Food Insecurity: No Food Insecurity (06/08/2022)   Hunger Vital Sign    Worried About Running Out of Food in the Last Year: Never true    Ran Out of Food in the Last Year: Never true  Transportation Needs: No Transportation Needs (06/08/2022)   PRAPARE - Hydrologist (Medical): No    Lack of Transportation (Non-Medical): No  Physical Activity: Insufficiently Active (06/08/2022)   Exercise Vital Sign    Days of Exercise per Week: 3 days    Minutes of Exercise per Session: 30 min  Stress: No Stress Concern Present (06/08/2022)   [place]    Feeling of Stress : Not at all  Social Connections: Moderately Integrated (06/08/2022)   Social Connection and Isolation Panel [NHANES]    Frequency of Communication with Friends and Family: More than three times a week    Frequency of Social Gatherings with Friends and Family: More than three times a week    Attends Religious Services: More than 4 times per year    Active Member of [employer] or Organizations: No    Attends Programme researcher, broadcasting/film/video: Never    Marital Status: Married    Tobacco Counseling Counseling given: Not Answered   Clinical Intake:  Pre-visit preparation completed: Yes  Pain : No/denies pain     Nutritional Risks: None Diabetes: Yes  How often do you need to have someone help you when you read instructions, pamphlets, or other written materials from your doctor or pharmacy?: 1 - Never  Diabetic?yes  Nutrition Risk Assessment:  Has the patient had any N/V/D within the last 2 months?  No  Does the patient have any non-healing wounds?  No  Has the patient had any unintentional weight loss or weight gain?  No   Diabetes:  Is the patient diabetic?  Yes  If diabetic, was a CBG obtained today?  No  Did the patient bring in their glucometer from home?  No  How often do you monitor your CBG's? 3 x day .   Financial Strains and Diabetes Management:  Are you having any financial strains with the device, your supplies or your medication? No .  Does the patient want to be seen by Chronic Care Management for management of their diabetes?  No  Would the patient like to be referred to a Nutritionist or for Diabetic Management?  No   Diabetic Exams:  Diabetic Eye Exam: Completed 01/2023 Diabetic Foot Exam: Completed next office visit     Interpreter Needed?: No  Information entered by :: Jadene Pierini, LPN   Activities of Daily Living    06/08/2022    2:21 PM 11/13/2021    6:12 PM  In your present state of health, do you have any difficulty performing the following activities:  Hearing? 0 0  Vision? 0 0  Difficulty concentrating or making decisions? 0 0  Walking or climbing stairs? 0 0  Dressing or bathing? 0 0  Doing errands, shopping? 0 0  Preparing Food and eating ? N N  Using the Toilet? N N  In the past six months, have you accidently leaked urine? N N  Do you have problems with loss of bowel control? N N  Managing your Medications? N N  Managing your Finances? N N   Housekeeping or managing your Housekeeping? N N    Patient Care Team: Janora Norlander, DO as PCP - General (Family Medicine) Clarene Essex, MD (Gastroenterology) Minus Breeding, MD as Consulting Physician (Cardiology)  Indicate any recent Medical Services you may have received from other than [facility] providers in the past year (date may be approximate).     Assessment:   This is a routine wellness examination for [place].  Hearing/Vision screen Vision Screening - Comments:: Wears rx glasses - up to date with routine eye exams with  Dr.Lee   Dietary issues and exercise activities discussed: Current Exercise Habits: Home exercise routine, Type of exercise: walking, Time (Minutes): 30, Frequency (Times/Week): 3, Weekly Exercise (Minutes/Week): 90, Intensity: Mild, Exercise limited by: None identified   Goals Addressed             This Visit's Progress    Exercise 3x per week (30 min per time)   On track    Walk for 30 minutes at least 3 times weekly     Patient Stated   On track    Continue healthy diet and exercise habits.       Depression Screen    06/08/2022    2:20 PM 06/05/2022    8:11 AM 12/01/2021    8:00 AM 11/13/2021    6:08 PM 06/06/2021   12:57 PM 06/03/2021    8:48 AM 11/29/2020    8:27 AM  PHQ 2/9 Scores  PHQ - 2 Score 0 0 0 0 1 0 0  PHQ- 9 Score 0 0 0        Fall Risk    06/08/2022    2:19 PM 06/05/2022    8:11 AM 12/01/2021    8:00 AM 11/13/2021    6:12 PM 06/06/2021   12:55 PM  Fall Risk   Falls in the past year? 0 0 0 0 0  Number falls in past yr: 0 0  0 0  Injury with Fall? 0 0  0  0  Risk for fall due to : No Fall Risks No Fall Risks  No Fall Risks No Fall Risks  Follow up Falls prevention discussed Education provided  Falls evaluation completed;Education provided;Falls prevention discussed Falls prevention discussed    FALL RISK PREVENTION PERTAINING TO THE HOME:  Any stairs in or around the home? Yes  If so, are there any without handrails? No   Home free of loose throw rugs in walkways, pet beds, electrical cords, etc? Yes  Adequate lighting in your home to reduce risk of falls? Yes   ASSISTIVE DEVICES UTILIZED TO PREVENT FALLS:  Life alert? No  Use of a cane, walker or w/c? No  Grab bars in the bathroom? Yes  Shower chair or bench in shower? Yes  Elevated toilet seat or a handicapped toilet? Yes       05/30/2018    8:59 AM 05/25/2017    8:14 AM 12/27/2015    8:45 AM  MMSE - Mini Mental State Exam  Orientation to time 5 5 5  $ Orientation to Place 5 5 5  $ Registration 3 3 3  $ Attention/ Calculation 4 5 5  $ Recall 3 3 3  $ Language- name 2 objects 2 2 2  $ Language- repeat 1 1 1  $ Language- follow 3 step command 3 3 3  $ Language- read & follow direction 1 1 1  $ Write a sentence 1 1 1  $ Copy design 1 1 1  $ Total score 29 30 30        $ 06/08/2022    2:21 PM 06/04/2020    8:14 AM 06/01/2019    8:36 AM  6CIT Screen  What Year? 0 points 0 points 0 points  What month? 0 points 0 points 0 points  What time? 0 points 0 points 0 points  Count back from 20 0 points 0 points 0 points  Months in reverse 0 points 0 points 0 points  Repeat phrase 0 points 0 points 0 points  Total Score 0 points 0 points 0 points    Immunizations Immunization History  Administered Date(s) Administered   Fluad Quad(high Dose 65+) 01/17/2019, 01/29/2020, 02/19/2021, 02/23/2022   Influenza Whole 11/18/2008   Influenza, High Dose Seasonal PF 02/22/2015, 03/09/2016, 04/30/2017, 02/17/2018   Influenza,inj,Quad PF,6+ Mos 04/11/2013, 04/19/2014   Moderna Sars-Covid-2 Vaccination 08/14/2019, 09/11/2019, 05/28/2020   Pneumococcal Conjugate-13 04/11/2013   Pneumococcal Polysaccharide-23 04/19/2014   Td 01/17/2019   Tdap 06/18/2008   Zoster Recombinat (Shingrix) 06/03/2021, 12/01/2021   Zoster, Live 04/30/2010    TDAP status: Up to date  Flu Vaccine status: Up to date  Pneumococcal vaccine status: Up to date  Covid-19 vaccine status: Completed  vaccines  Qualifies for Shingles Vaccine? Yes   Zostavax completed Yes   Shingrix Completed?: Yes  Screening Tests Health Maintenance  Topic Date Due   OPHTHALMOLOGY EXAM  01/29/2022   COVID-19 Vaccine (4 - 2023-24 season) 06/21/2022 (Originally 12/19/2021)   Hepatitis C Screening  07/25/2022 (Originally 04/09/1964)   FOOT EXAM  12/02/2022   HEMOGLOBIN A1C  12/04/2022   Diabetic kidney evaluation - eGFR measurement  06/06/2023   Diabetic kidney evaluation - Urine ACR  06/06/2023   Medicare Annual Wellness (AWV)  06/09/2023   COLONOSCOPY (Pts 45-26yr Insurance coverage will need to be confirmed)  02/22/2024   DTaP/Tdap/Td (3 - Td or Tdap) 01/16/2029   Pneumonia Vaccine 75 Years old  Completed   INFLUENZA VACCINE  Completed   Zoster Vaccines- Shingrix  Completed   HPV VACCINES  Aged Out  COLON CANCER SCREENING ANNUAL FOBT  Discontinued    Health Maintenance  Health Maintenance Due  Topic Date Due   OPHTHALMOLOGY EXAM  01/29/2022    Colorectal cancer screening: Type of screening: Colonoscopy. Completed 02/22/2019. Repeat every 5 years  Lung Cancer Screening: (Low Dose CT Chest recommended if Age 10-80 years, 30 pack-year currently smoking OR have quit w/in 15years.) does not qualify.   Lung Cancer Screening Referral: n/a  Additional Screening:  Hepatitis C Screening: does not qualify;   Vision Screening: Recommended annual ophthalmology exams for early detection of glaucoma and other disorders of the eye. Is the patient up to date with their annual eye exam?  Yes  Who is the provider or what is the name of the office in which the patient attends annual eye exams? Dr.Lee  If pt is not established with a provider, would they like to be referred to a provider to establish care? No .   Dental Screening: Recommended annual dental exams for proper oral hygiene  Community Resource Referral / Chronic Care Management: CRR required this visit?  No   CCM required this visit?   No      Plan:     I have personally reviewed and noted the following in the patient's chart:   Medical and social history Use of alcohol, tobacco or illicit drugs  Current medications and supplements including opioid prescriptions. Patient is not currently taking opioid prescriptions. Functional ability and status Nutritional status Physical activity Advanced directives List of other physicians Hospitalizations, surgeries, and ER visits in previous 12 months Vitals Screenings to include cognitive, depression, and falls Referrals and appointments  In addition, I have reviewed and discussed with patient certain preventive protocols, quality metrics, and best practice recommendations. A written personalized care plan for preventive services as well as general preventive health recommendations were provided to patient.     Daphane Shepherd, LPN   [id number]   Nurse Notes: none

## 2022-07-03 ENCOUNTER — Other Ambulatory Visit: Payer: Medicare Other

## 2022-07-03 DIAGNOSIS — R7989 Other specified abnormal findings of blood chemistry: Secondary | ICD-10-CM | POA: Diagnosis not present

## 2022-07-03 DIAGNOSIS — E1169 Type 2 diabetes mellitus with other specified complication: Secondary | ICD-10-CM | POA: Diagnosis not present

## 2022-07-03 DIAGNOSIS — I152 Hypertension secondary to endocrine disorders: Secondary | ICD-10-CM | POA: Diagnosis not present

## 2022-07-03 DIAGNOSIS — E785 Hyperlipidemia, unspecified: Secondary | ICD-10-CM | POA: Diagnosis not present

## 2022-07-03 DIAGNOSIS — E1159 Type 2 diabetes mellitus with other circulatory complications: Secondary | ICD-10-CM | POA: Diagnosis not present

## 2022-07-04 ENCOUNTER — Other Ambulatory Visit: Payer: Self-pay | Admitting: Family Medicine

## 2022-07-04 DIAGNOSIS — E039 Hypothyroidism, unspecified: Secondary | ICD-10-CM

## 2022-07-04 LAB — BASIC METABOLIC PANEL
BUN/Creatinine Ratio: 17 (ref 10–24)
BUN: 23 mg/dL (ref 8–27)
CO2: 19 mmol/L — ABNORMAL LOW (ref 20–29)
Calcium: 9.7 mg/dL (ref 8.6–10.2)
Chloride: 101 mmol/L (ref 96–106)
Creatinine, Ser: 1.39 mg/dL — ABNORMAL HIGH (ref 0.76–1.27)
Glucose: 136 mg/dL — ABNORMAL HIGH (ref 70–99)
Potassium: 4 mmol/L (ref 3.5–5.2)
Sodium: 137 mmol/L (ref 134–144)
eGFR: 53 mL/min/{1.73_m2} — ABNORMAL LOW (ref >59)

## 2022-07-04 LAB — T4, FREE: Free T4: 1.83 ng/dL — ABNORMAL HIGH (ref 0.82–1.77)

## 2022-07-04 LAB — TSH: TSH: 1.66 u[IU]/mL (ref 0.450–4.500)

## 2022-07-04 MED ORDER — LEVOTHYROXINE SODIUM 88 MCG PO TABS: ORAL_TABLET | ORAL | 3 refills | Status: DC

## 2022-07-07 ENCOUNTER — Other Ambulatory Visit: Payer: Self-pay | Admitting: *Deleted

## 2022-07-07 DIAGNOSIS — E1169 Type 2 diabetes mellitus with other specified complication: Secondary | ICD-10-CM

## 2022-07-07 DIAGNOSIS — R7989 Other specified abnormal findings of blood chemistry: Secondary | ICD-10-CM

## 2022-07-07 DIAGNOSIS — E039 Hypothyroidism, unspecified: Secondary | ICD-10-CM

## 2022-08-17 ENCOUNTER — Other Ambulatory Visit: Payer: Self-pay | Admitting: Family Medicine

## 2022-08-17 DIAGNOSIS — D508 Other iron deficiency anemias: Secondary | ICD-10-CM

## 2022-08-20 ENCOUNTER — Other Ambulatory Visit: Payer: Self-pay | Admitting: Family Medicine

## 2022-08-20 DIAGNOSIS — E1169 Type 2 diabetes mellitus with other specified complication: Secondary | ICD-10-CM

## 2022-08-20 DIAGNOSIS — I700 Atherosclerosis of aorta: Secondary | ICD-10-CM

## 2022-09-04 ENCOUNTER — Ambulatory Visit (INDEPENDENT_AMBULATORY_CARE_PROVIDER_SITE_OTHER): Payer: Medicare Other | Admitting: Family Medicine

## 2022-09-04 ENCOUNTER — Encounter: Payer: Self-pay | Admitting: Family Medicine

## 2022-09-04 VITALS — BP 130/68 | HR 89 | Temp 98.6°F | Ht 71.0 in | Wt 162.0 lb

## 2022-09-04 DIAGNOSIS — I152 Hypertension secondary to endocrine disorders: Secondary | ICD-10-CM | POA: Diagnosis not present

## 2022-09-04 DIAGNOSIS — Z7984 Long term (current) use of oral hypoglycemic drugs: Secondary | ICD-10-CM

## 2022-09-04 DIAGNOSIS — E119 Type 2 diabetes mellitus without complications: Secondary | ICD-10-CM

## 2022-09-04 DIAGNOSIS — E1169 Type 2 diabetes mellitus with other specified complication: Secondary | ICD-10-CM

## 2022-09-04 DIAGNOSIS — E039 Hypothyroidism, unspecified: Secondary | ICD-10-CM | POA: Diagnosis not present

## 2022-09-04 DIAGNOSIS — E1159 Type 2 diabetes mellitus with other circulatory complications: Secondary | ICD-10-CM

## 2022-09-04 DIAGNOSIS — E785 Hyperlipidemia, unspecified: Secondary | ICD-10-CM | POA: Diagnosis not present

## 2022-09-04 LAB — BAYER DCA HB A1C WAIVED: HB A1C (BAYER DCA - WAIVED): 6.7 % — ABNORMAL HIGH (ref 4.8–5.6)

## 2022-09-04 NOTE — Progress Notes (Signed)
Subjective: CC:DM PCP: Raliegh Ip, DO HPI:Matthew Daugherty is a 77 y.o. male presenting to clinic today for:  1. Type 2 Diabetes with hypertension, hyperlipidemia:  Compliant with medications.  No hypoglycemic episodes to report.  He does not report any chest pain, shortness of breath or dizziness.  Last eye exam: needs Last foot exam: UTD Last A1c:  Lab Results  Component Value Date   HGBA1C 7.6 (H) 06/05/2022   Nephropathy screen indicated?: UTD Last flu, zoster and/or pneumovax:  Immunization History  Administered Date(s) Administered   Fluad Quad(high Dose 65+) 01/17/2019, 01/29/2020, 02/19/2021, 02/23/2022   Influenza Whole 11/18/2008   Influenza, High Dose Seasonal PF 02/22/2015, 03/09/2016, 04/30/2017, 02/17/2018   Influenza,inj,Quad PF,6+ Mos 04/11/2013, 04/19/2014   Moderna Sars-Covid-2 Vaccination 08/14/2019, 09/11/2019, 05/28/2020   Pneumococcal Conjugate-13 04/11/2013   Pneumococcal Polysaccharide-23 04/19/2014   Td 01/17/2019   Tdap 06/18/2008   Zoster Recombinat (Shingrix) 06/03/2021, 12/01/2021   Zoster, Live 04/30/2010   2.  Hypothyroidism Compliant with Synthroid.  No reports of tremor or changes in bowel habits.  He reports that his mood has been better because his wife seems to be recovering well from her cardioversion    ROS: Per HPI  Allergies  Allergen Reactions   Niaspan [Niacin Er] Other (See Comments)    Increases blood sugars    Past Medical History:  Diagnosis Date   Cardiac dysrhythmia, unspecified    Diabetes mellitus without complication (HCC)    Essential hypertension, benign    Hyperplasia of prostate    Other and unspecified hyperlipidemia    Thyroid disease    Unspecified transient cerebral ischemia     Current Outpatient Medications:    aspirin 81 MG EC tablet, Take 81 mg by mouth daily., Disp: , Rfl:    atorvastatin (LIPITOR) 80 MG tablet, TAKE 1 TABLET (80 MG TOTAL) BY MOUTH DAILY., Disp: 90 tablet, Rfl: 0    Cholecalciferol (VITAMIN D-3) 5000 UNITS TABS, Take 1 tablet by mouth daily., Disp: , Rfl:    FeFum-FePoly-FA-B Cmp-C-Biot (INTEGRA PLUS) CAPS, TAKE 1 CAPSULE EVERY DAY, Disp: 90 capsule, Rfl: 0   fenofibrate 160 MG tablet, TAKE 1 TABLET (160 MG TOTAL) BY MOUTH DAILY., Disp: 90 tablet, Rfl: 0   ketoconazole (NIZORAL) 2 % cream, APPLY 1 APPLICATION TOPICALLY DAILY. TO RASH ON MUSTACHE AREA UNTIL RASH RESOLVED (2-4 WEEKS), Disp: 60 g, Rfl: 0   levothyroxine (SYNTHROID) 88 MCG tablet, Take 1 tablet daily. NEW DOSE, Disp: 90 tablet, Rfl: 3   losartan-hydrochlorothiazide (HYZAAR) 100-25 MG tablet, Take 1 tablet by mouth daily., Disp: 90 tablet, Rfl: 3   metFORMIN (GLUCOPHAGE) 1000 MG tablet, Take 1 tablet (1,000 mg total) by mouth 2 (two) times daily with a meal., Disp: 180 tablet, Rfl: 3   Multiple Vitamin (MULTIVITAMIN WITH MINERALS) TABS tablet, Take 1 tablet by mouth daily., Disp: , Rfl:  Social History   Socioeconomic History   Marital status: Married    Spouse name: Steward Drone   Number of children: 1   Years of education: 12   Highest education level: High school graduate  Occupational History   Occupation: retired    Associate Professor: [employer]  Tobacco Use   Smoking status: Former    Packs/day: 1.00    Years: 44.00    Additional pack years: 0.00    Total pack years: 44.00    Types: Cigarettes    Start date: 04/20/1954    Quit date: 06/13/1998    Years since quitting: 24.2  Passive exposure: Past   Smokeless tobacco: Never  Vaping Use   Vaping Use: Never used  Substance and Sexual Activity   Alcohol use: No   Drug use: No   Sexual activity: Yes  Other Topics Concern   Not on file  Social History Narrative   Patient lives with his wife in a 2 story home.  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  He has one son, and 2 grand daughters.   Social Determinants of Health   Financial Resource Strain: Low Risk  (06/08/2022)   Overall Financial  Resource Strain (CARDIA)    Difficulty of Paying Living Expenses: Not hard at all  Food Insecurity: No Food Insecurity (06/08/2022)   Hunger Vital Sign    Worried About Running Out of Food in the Last Year: Never true    Ran Out of Food in the Last Year: Never true  Transportation Needs: No Transportation Needs (06/08/2022)   PRAPARE - Administrator, Civil Service (Medical): No    Lack of Transportation (Non-Medical): No  Physical Activity: Insufficiently Active (06/08/2022)   Exercise Vital Sign    Days of Exercise per Week: 3 days    Minutes of Exercise per Session: 30 min  Stress: No Stress Concern Present (06/08/2022)   [employer] of Occupational Health - Occupational Stress Questionnaire    Feeling of Stress : Not at all  Social Connections: Moderately Integrated (06/08/2022)   Social Connection and Isolation Panel [NHANES]    Frequency of Communication with Friends and Family: More than three times a week    Frequency of Social Gatherings with Friends and Family: More than three times a week    Attends Religious Services: More than 4 times per year    Active Member of [employer] or Organizations: No    Attends Banker Meetings: Never    Marital Status: Married  Catering manager Violence: Not At Risk (06/08/2022)   Humiliation, Afraid, Rape, and Kick questionnaire    Fear of Current or Ex-Partner: No    Emotionally Abused: No    Physically Abused: No    Sexually Abused: No   Family History  Problem Relation Age of Onset   Cancer Mother        bone   Asthma Father    Heart attack Father        20   Cirrhosis Brother     Objective: Office vital signs reviewed. BP 130/68   Pulse 89   Temp 98.6 F (37 C)   Ht 5\' 11"  (1.803 m)   Wt 162 lb (73.5 kg)   SpO2 97%   BMI 22.59 kg/m   Physical Examination:  General: Awake, alert, well nourished, No acute distress HEENT: sclera white, MMM Cardio: regular rate and rhythm, S1S2 heard, no murmurs  appreciated Pulm: clear to auscultation bilaterally, no wheezes, rhonchi or rales; normal work of breathing on room air Neuro: No tremor.  Assessment/ Plan: 77 y.o. male   Type 2 diabetes mellitus without complication, without long-term current use of insulin (HCC) - Plan: Bayer DCA Hb A1c Waived, CMP14+EGFR  Hypertension associated with type 2 diabetes mellitus (HCC) - Plan: CMP14+EGFR  Hyperlipidemia associated with type 2 diabetes mellitus (HCC) - Plan: CMP14+EGFR  Acquired hypothyroidism - Plan: TSH, T4, Free  Sugar under good control now.  No changes.  May follow-up in 4 to 6 months  Blood pressure controlled.  Continue current regimen  Continue  cholesterol control  Asymptomatic from a thyroid standpoint but check thyroid labs  No orders of the defined types were placed in this encounter.  No orders of the defined types were placed in this encounter.    Raliegh Ip, DO [facility] [phone]

## 2022-09-05 LAB — T4, FREE: Free T4: 1.67 ng/dL (ref 0.82–1.77)

## 2022-09-05 LAB — CMP14+EGFR
ALT: 24 IU/L (ref 0–44)
AST: 27 IU/L (ref 0–40)
Albumin/Globulin Ratio: 2.8 — ABNORMAL HIGH (ref 1.2–2.2)
Albumin: 4.7 g/dL (ref 3.8–4.8)
Alkaline Phosphatase: 56 IU/L (ref 44–121)
BUN/Creatinine Ratio: 19 (ref 10–24)
BUN: 25 mg/dL (ref 8–27)
Bilirubin Total: 0.4 mg/dL (ref 0.0–1.2)
CO2: 19 mmol/L — ABNORMAL LOW (ref 20–29)
Calcium: 9.7 mg/dL (ref 8.6–10.2)
Chloride: 97 mmol/L (ref 96–106)
Creatinine, Ser: 1.32 mg/dL — ABNORMAL HIGH (ref 0.76–1.27)
Globulin, Total: 1.7 g/dL (ref 1.5–4.5)
Glucose: 137 mg/dL — ABNORMAL HIGH (ref 70–99)
Potassium: 4 mmol/L (ref 3.5–5.2)
Sodium: 134 mmol/L (ref 134–144)
Total Protein: 6.4 g/dL (ref 6.0–8.5)
eGFR: 57 mL/min/{1.73_m2} — ABNORMAL LOW (ref >59)

## 2022-09-05 LAB — TSH: TSH: 1.88 u[IU]/mL (ref 0.450–4.500)

## 2022-11-03 ENCOUNTER — Other Ambulatory Visit: Payer: Self-pay | Admitting: Family Medicine

## 2022-11-03 DIAGNOSIS — D508 Other iron deficiency anemias: Secondary | ICD-10-CM

## 2022-11-18 ENCOUNTER — Other Ambulatory Visit: Payer: Self-pay | Admitting: Family Medicine

## 2022-11-18 DIAGNOSIS — I700 Atherosclerosis of aorta: Secondary | ICD-10-CM

## 2022-11-18 DIAGNOSIS — E1169 Type 2 diabetes mellitus with other specified complication: Secondary | ICD-10-CM

## 2023-01-22 LAB — HM DIABETES EYE EXAM

## 2023-03-15 ENCOUNTER — Telehealth: Payer: Self-pay

## 2023-03-15 ENCOUNTER — Ambulatory Visit (INDEPENDENT_AMBULATORY_CARE_PROVIDER_SITE_OTHER): Payer: Medicare Other | Admitting: Family Medicine

## 2023-03-15 ENCOUNTER — Encounter: Payer: Self-pay | Admitting: Family Medicine

## 2023-03-15 VITALS — BP 150/82 | HR 91 | Temp 98.4°F | Ht 71.0 in | Wt 162.0 lb

## 2023-03-15 DIAGNOSIS — N1831 Chronic kidney disease, stage 3a: Secondary | ICD-10-CM

## 2023-03-15 DIAGNOSIS — E1169 Type 2 diabetes mellitus with other specified complication: Secondary | ICD-10-CM | POA: Diagnosis not present

## 2023-03-15 DIAGNOSIS — E119 Type 2 diabetes mellitus without complications: Secondary | ICD-10-CM

## 2023-03-15 DIAGNOSIS — E039 Hypothyroidism, unspecified: Secondary | ICD-10-CM | POA: Diagnosis not present

## 2023-03-15 DIAGNOSIS — E1159 Type 2 diabetes mellitus with other circulatory complications: Secondary | ICD-10-CM | POA: Diagnosis not present

## 2023-03-15 DIAGNOSIS — Z23 Encounter for immunization: Secondary | ICD-10-CM | POA: Diagnosis not present

## 2023-03-15 DIAGNOSIS — E785 Hyperlipidemia, unspecified: Secondary | ICD-10-CM | POA: Diagnosis not present

## 2023-03-15 DIAGNOSIS — Z7984 Long term (current) use of oral hypoglycemic drugs: Secondary | ICD-10-CM

## 2023-03-15 DIAGNOSIS — I152 Hypertension secondary to endocrine disorders: Secondary | ICD-10-CM | POA: Diagnosis not present

## 2023-03-15 LAB — BAYER DCA HB A1C WAIVED: HB A1C (BAYER DCA - WAIVED): 6.9 % — ABNORMAL HIGH (ref 4.8–5.6)

## 2023-03-15 MED ORDER — EMPAGLIFLOZIN 25 MG PO TABS
25.0000 mg | ORAL_TABLET | Freq: Every day | ORAL | 3 refills | Status: DC
Start: 2023-03-15 — End: 2023-04-23

## 2023-03-15 NOTE — Progress Notes (Signed)
Subjective: CC:DM PCP: Raliegh Ip, DO HPI:Matthew Daugherty is a 77 y.o. male presenting to clinic today for:  1. Type 2 Diabetes with hypertension, hyperlipidemia w/ CKD3a:  Reports compliance with meds.  Didn't remember about possibly having CKD.  Almost never uses NSAIDs. Has good urine outpt. On ARB.  Diabetes Health Maintenance Due  Topic Date Due   FOOT EXAM  12/02/2022   HEMOGLOBIN A1C  03/07/2023   OPHTHALMOLOGY EXAM  01/22/2024    Last A1c:  Lab Results  Component Value Date   HGBA1C 6.7 (H) 09/04/2022    ROS: Denies dizziness, LOC, polyuria, polydipsia, unintended weight loss/gain, foot ulcerations, numbness or tingling in extremities, shortness of breath or chest pain.  2. Hypothyroidism Compliant with meds. No reports of changes in BMs, weight or heart palpitations.  ROS: Per HPI  Allergies  Allergen Reactions   Niaspan [Niacin Er (Antihyperlipidemic)] Other (See Comments)    Increases blood sugars    Past Medical History:  Diagnosis Date   Cardiac dysrhythmia, unspecified    Diabetes mellitus without complication (HCC)    Essential hypertension, benign    Hyperplasia of prostate    Other and unspecified hyperlipidemia    Thyroid disease    Unspecified transient cerebral ischemia     Current Outpatient Medications:    aspirin 81 MG EC tablet, Take 81 mg by mouth daily., Disp: , Rfl:    atorvastatin (LIPITOR) 80 MG tablet, TAKE 1 TABLET (80 MG TOTAL) BY MOUTH DAILY., Disp: 90 tablet, Rfl: 1   Cholecalciferol (VITAMIN D-3) 5000 UNITS TABS, Take 1 tablet by mouth daily., Disp: , Rfl:    FeFum-FePoly-FA-B Cmp-C-Biot (INTEGRA PLUS) CAPS, TAKE 1 CAPSULE EVERY DAY, Disp: 90 capsule, Rfl: 1   fenofibrate 160 MG tablet, TAKE 1 TABLET (160 MG TOTAL) BY MOUTH DAILY., Disp: 90 tablet, Rfl: 1   ketoconazole (NIZORAL) 2 % cream, APPLY 1 APPLICATION TOPICALLY DAILY. TO RASH ON MUSTACHE AREA UNTIL RASH RESOLVED (2-4 WEEKS), Disp: 60 g, Rfl: 0   levothyroxine  (SYNTHROID) 88 MCG tablet, Take 1 tablet daily. NEW DOSE, Disp: 90 tablet, Rfl: 3   losartan-hydrochlorothiazide (HYZAAR) 100-25 MG tablet, Take 1 tablet by mouth daily., Disp: 90 tablet, Rfl: 3   metFORMIN (GLUCOPHAGE) 1000 MG tablet, Take 1 tablet (1,000 mg total) by mouth 2 (two) times daily with a meal., Disp: 180 tablet, Rfl: 3   Multiple Vitamin (MULTIVITAMIN WITH MINERALS) TABS tablet, Take 1 tablet by mouth daily., Disp: , Rfl:  Social History   Socioeconomic History   Marital status: Married    Spouse name: Steward Drone   Number of children: 1   Years of education: 12   Highest education level: High school graduate  Occupational History   Occupation: retired    Associate Professor: [employer]  Tobacco Use   Smoking status: Former    Current packs/day: 0.00    Average packs/day: 1 pack/day for 44.1 years (44.1 ttl pk-yrs)    Types: Cigarettes    Start date: 04/20/1954    Quit date: 06/13/1998    Years since quitting: 24.7    Passive exposure: Past   Smokeless tobacco: Never  Vaping Use   Vaping status: Never Used  Substance and Sexual Activity   Alcohol use: No   Drug use: No   Sexual activity: Yes  Other Topics Concern   Not on file  Social History Narrative   Patient lives with his wife in a 2 story home.  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  He has one son, and 2 grand daughters.   Social Determinants of Health   Financial Resource Strain: Low Risk  (06/08/2022)   Overall Financial Resource Strain (CARDIA)    Difficulty of Paying Living Expenses: Not hard at all  Food Insecurity: No Food Insecurity (06/08/2022)   Hunger Vital Sign    Worried About Running Out of Food in the Last Year: Never true    Ran Out of Food in the Last Year: Never true  Transportation Needs: No Transportation Needs (06/08/2022)   PRAPARE - Administrator, Civil Service (Medical): No    Lack of Transportation (Non-Medical): No  Physical Activity:  Insufficiently Active (06/08/2022)   Exercise Vital Sign    Days of Exercise per Week: 3 days    Minutes of Exercise per Session: 30 min  Stress: No Stress Concern Present (06/08/2022)   [employer] of Occupational Health - Occupational Stress Questionnaire    Feeling of Stress : Not at all  Social Connections: Moderately Integrated (06/08/2022)   Social Connection and Isolation Panel [NHANES]    Frequency of Communication with Friends and Family: More than three times a week    Frequency of Social Gatherings with Friends and Family: More than three times a week    Attends Religious Services: More than 4 times per year    Active Member of [employer] or Organizations: No    Attends Banker Meetings: Never    Marital Status: Married  Catering manager Violence: Not At Risk (06/08/2022)   Humiliation, Afraid, Rape, and Kick questionnaire    Fear of Current or Ex-Partner: No    Emotionally Abused: No    Physically Abused: No    Sexually Abused: No   Family History  Problem Relation Age of Onset   Cancer Mother        bone   Asthma Father    Heart attack Father        33   Cirrhosis Brother     Objective: Office vital signs reviewed. BP (!) 150/82   Pulse 91   Temp 98.4 F (36.9 C)   Ht 5\' 11"  (1.803 m)   Wt 162 lb (73.5 kg)   SpO2 100%   BMI 22.59 kg/m   Physical Examination:  General: Awake, alert, well nourished, No acute distress HEENT: sclera white, MMM. No exophthalmos  Cardio: regular rate and rhythm, S1S2 heard, no murmurs appreciated Pulm: clear to auscultation bilaterally, no wheezes, rhonchi or rales; normal work of breathing on room air Extremities: warm, well perfused, No edema, cyanosis or clubbing; +2 pulses bilaterally    Diabetic Foot Exam - Simple   Simple Foot Form Diabetic Foot exam was performed with the following findings: Yes 03/15/2023  8:48 AM  Visual Inspection No deformities, no ulcerations, no other skin breakdown bilaterally:  Yes Sensation Testing Intact to touch and monofilament testing bilaterally: Yes Pulse Check Posterior Tibialis and Dorsalis pulse intact bilaterally: Yes Comments      Assessment/ Plan: 77 y.o. male   Diabetes mellitus treated with oral medication (HCC) - Plan: Bayer DCA Hb A1c Waived  Hypertension associated with type 2 diabetes mellitus (HCC)  Hyperlipidemia associated with type 2 diabetes mellitus (HCC)  Acquired hypothyroidism - Plan: T4, Free, TSH  Chronic kidney disease, stage 3a (HCC) - Plan: Renal Function Panel, VITAMIN D 25 Hydroxy (Vit-D Deficiency, Fractures)   Sugar controlled but rising and now borderline at 6.9.  STOP metformin. Start Morton. 10mg  samples given, 25mg  sent to pharmacy. Referral to clinical pharmacy for PAP.  BP borderline. Hopefully, the Jardiance will reduce. Cautioned need for adequate hydration and red flags warranting discontinuation of med.  Continue statin. Not due for lipid yet.    Check thyroid levels. Clinically asymptomatic.   Check renal function, vit d. Start jardiance as above. Continue ARB  Raliegh Ip, DO [facility] [phone]

## 2023-03-15 NOTE — Patient Instructions (Signed)
STOP Metformin. START Jardiance samples (10mg ).  The therapeutic dose (25mg ) has been sent to your pharmacy. Make sure to stay well hydrated on this medication. Consider seeing Raynelle Fanning our clinical pharmacist for patient assistance.  She may be able to get this medication for free for you.

## 2023-03-15 NOTE — Progress Notes (Signed)
   Care Guide Note  03/15/2023 Name: Matthew Daugherty MRN: [id number] DOB: 20-Dec-1945  Referred by: Raliegh Ip, DO Reason for referral : Care Coordination (Outreach to schedule with Pharm d )   Matthew Daugherty is a 77 y.o. year old male who is a primary care patient of Raliegh Ip, DO. [name] was referred to the pharmacist for assistance related to DM.    An unsuccessful telephone outreach was attempted today to contact the patient who was referred to the pharmacy team for assistance with medication assistance. Additional attempts will be made to contact the patient.   Penne Lash , RMA     [facility]  [facility], [facility] Guide  Direct Dial: [phone]  Website: [email or web address]

## 2023-03-16 LAB — RENAL FUNCTION PANEL
Albumin: 4.9 g/dL — ABNORMAL HIGH (ref 3.8–4.8)
BUN/Creatinine Ratio: 13 (ref 10–24)
BUN: 17 mg/dL (ref 8–27)
CO2: 19 mmol/L — ABNORMAL LOW (ref 20–29)
Calcium: 10.2 mg/dL (ref 8.6–10.2)
Chloride: 100 mmol/L (ref 96–106)
Creatinine, Ser: 1.33 mg/dL — ABNORMAL HIGH (ref 0.76–1.27)
Glucose: 131 mg/dL — ABNORMAL HIGH (ref 70–99)
Phosphorus: 3.4 mg/dL (ref 2.8–4.1)
Potassium: 4.8 mmol/L (ref 3.5–5.2)
Sodium: 139 mmol/L (ref 134–144)
eGFR: 56 mL/min/{1.73_m2} — ABNORMAL LOW (ref >59)

## 2023-03-16 LAB — TSH: TSH: 3.62 u[IU]/mL (ref 0.450–4.500)

## 2023-03-16 LAB — VITAMIN D 25 HYDROXY (VIT D DEFICIENCY, FRACTURES): Vit D, 25-Hydroxy: 56.4 ng/mL (ref 30.0–100.0)

## 2023-03-16 LAB — T4, FREE: Free T4: 1.83 ng/dL — ABNORMAL HIGH (ref 0.82–1.77)

## 2023-03-17 ENCOUNTER — Telehealth: Payer: Self-pay | Admitting: Family Medicine

## 2023-03-17 NOTE — Telephone Encounter (Signed)
Copied from CRM [id number]. Topic: Clinical - Medication Question >> Mar 17, 2023  9:17 AM Conni Elliot wrote: Reason for CRM: pt would like to speak with pharmacist Teena Dunk in regards to rx recently started

## 2023-03-22 NOTE — Telephone Encounter (Signed)
Calling back to reschedule appt Just let him know --for samples he can call the office New Patient assistance will not be open/approved until the new year Jan 2025 due to holidays and processing times

## 2023-03-23 NOTE — Progress Notes (Signed)
   Care Guide Note  03/23/2023 Name: [name] MRN: [id number] DOB: [birth year]  Referred by: Raliegh Ip, DO Reason for referral : Care Coordination (Outreach to schedule with Pharm d )   Matthew Daugherty is a 77 y.o. year old male who is a primary care patient of Raliegh Ip, DO. [name] was referred to the pharmacist for assistance related to DM.    Successful contact was made with the patient to discuss pharmacy services including being ready for the pharmacist to call at least 5 minutes before the scheduled appointment time, to have medication bottles and any blood sugar or blood pressure readings ready for review. The patient agreed to meet with the pharmacist via with the pharmacist via telephone visit on (date/time).  04/22/2023 Penne Lash , RMA     [facility], [facility] Guide  Direct Dial: [phone]  Website: [email or web address]

## 2023-04-06 ENCOUNTER — Other Ambulatory Visit: Payer: Self-pay | Admitting: Family Medicine

## 2023-04-06 DIAGNOSIS — D508 Other iron deficiency anemias: Secondary | ICD-10-CM

## 2023-04-12 ENCOUNTER — Other Ambulatory Visit: Payer: Self-pay | Admitting: Family Medicine

## 2023-04-12 DIAGNOSIS — E1169 Type 2 diabetes mellitus with other specified complication: Secondary | ICD-10-CM

## 2023-04-22 ENCOUNTER — Other Ambulatory Visit (INDEPENDENT_AMBULATORY_CARE_PROVIDER_SITE_OTHER): Payer: Medicare Other | Admitting: Pharmacist

## 2023-04-22 DIAGNOSIS — E119 Type 2 diabetes mellitus without complications: Secondary | ICD-10-CM

## 2023-04-22 DIAGNOSIS — Z7984 Long term (current) use of oral hypoglycemic drugs: Secondary | ICD-10-CM

## 2023-04-22 DIAGNOSIS — N1831 Chronic kidney disease, stage 3a: Secondary | ICD-10-CM | POA: Diagnosis not present

## 2023-04-23 MED ORDER — EMPAGLIFLOZIN 25 MG PO TABS: 25.0000 mg | ORAL_TABLET | Freq: Every day | ORAL | 3 refills | Status: DC

## 2023-04-24 ENCOUNTER — Other Ambulatory Visit: Payer: Self-pay | Admitting: Family Medicine

## 2023-04-24 DIAGNOSIS — E039 Hypothyroidism, unspecified: Secondary | ICD-10-CM

## 2023-05-04 ENCOUNTER — Other Ambulatory Visit: Payer: Self-pay | Admitting: Family Medicine

## 2023-05-04 DIAGNOSIS — E1169 Type 2 diabetes mellitus with other specified complication: Secondary | ICD-10-CM

## 2023-05-05 ENCOUNTER — Telehealth: Payer: Self-pay

## 2023-05-05 NOTE — Progress Notes (Signed)
 Pharmacy Medication Assistance Program Note    05/05/2023  Patient ID: Matthew Daugherty, male   DOB: Jun 28, 1945, 78 y.o.   MRN: [id number]     05/05/2023  Outreach Medication One  Manufacturer Medication One [employer]  [employer] Jardiance   Dose of Jardiance  25MG   Type of Sport and exercise psychologist  Date Application Submitted to [employer] 04/23/2023  Method Application Sent to Manufacturer Fax     Faxed by [facility] e-scribed

## 2023-05-07 NOTE — Progress Notes (Signed)
04/22/2023 Name: Matthew Daugherty MRN: [id number] DOB: Dec 26, 1945  Chief Complaint  Patient presents with   Diabetes    Matthew Daugherty is a 78 y.o. year old male who was referred for medication management by their primary care provider, Delynn Flavin M, DO. They presented for a face to face visit today.   They were referred to the pharmacist by their PCP for assistance in managing diabetes and medication access    Subjective:  Patient reports that he is stable on current regimen (Jardiance, metformin), however the cost of Jardiance continues to rise.  Care Team: Primary Care Provider: Raliegh Ip, DO   Medication Access/Adherence  Current Pharmacy:  [facility] - [place], [place] - [address] [place] [address] Phone: [phone] Fax: [phone]  [facility] - [place], [place] - [address] [place] [address] Phone: [phone] Fax: [phone]  Patient reports affordability concerns with their medications: Yes  Jardiance Patient reports access/transportation concerns to their pharmacy: No  Patient reports adherence concerns with their medications:   potentially with drug costs     Diabetes:  Current medications: Jardiance, metformin (GFR 56) Medications tried in the past: n/a  Current glucose readings: FBG<130  Patient denies hypoglycemic s/sx including dizziness, shakiness, sweating. Patient denies hyperglycemic symptoms including polyuria, polydipsia, polyphagia, nocturia, neuropathy, blurred vision.  Current meal patterns:  -Discussed meal planning options and Plate method for healthy eating Avoid sugary drinks and desserts Incorporate balanced protein, non starchy veggies, 1 serving of carbohydrate with each meal Increase water intake Increase physical activity as able  Current physical activity: encouraged as able  Current medication access support: will plan to enroll in BI  Cares PAP   Objective:  Lab Results  Component Value Date   HGBA1C 6.9 (H) 03/15/2023    Lab Results  Component Value Date   CREATININE 1.33 (H) 03/15/2023   BUN 17 03/15/2023   NA 139 03/15/2023   K 4.8 03/15/2023   CL 100 03/15/2023   CO2 19 (L) 03/15/2023    Lab Results  Component Value Date   CHOL 114 06/05/2022   HDL 51 06/05/2022   LDLCALC 47 06/05/2022   TRIG 78 06/05/2022   CHOLHDL 2.2 06/05/2022    Medications Reviewed Today     Reviewed by Danella Maiers, [facility] (Pharmacist) on 05/07/23 at 1114  Med List Status: <None>   Medication Order Taking? Sig Documenting Provider Last Dose Status Informant  aspirin 81 MG EC tablet [id number] No Take 81 mg by mouth daily. [provider] Taking Active Self  atorvastatin (LIPITOR) 80 MG tablet [id number] No TAKE 1 TABLET (80 MG TOTAL) BY MOUTH DAILY. Delynn Flavin M, DO Taking Active   Cholecalciferol (VITAMIN D-3) 5000 UNITS TABS [id number] No Take 1 tablet by mouth daily. [provider] Taking Active Self  empagliflozin (JARDIANCE) 25 MG TABS tablet [id number]  Take 1 tablet (25 mg total) by mouth daily before breakfast. Raliegh Ip, DO  Active   FeFum-FePoly-FA-B Cmp-C-Biot (INTEGRA PLUS) CAPS [id number]  TAKE 1 CAPSULE EVERY DAY Delynn Flavin M, DO  Active   fenofibrate 160 MG tablet [id number] No TAKE 1 TABLET (160 MG TOTAL) BY MOUTH DAILY. Delynn Flavin M, DO Taking Active   ketoconazole (NIZORAL) 2 % cream [id number] No APPLY 1 APPLICATION TOPICALLY DAILY. TO RASH ON MUSTACHE AREA UNTIL RASH RESOLVED (2-4 WEEKS) Delynn Flavin M, DO Taking Active   levothyroxine (SYNTHROID) 88 MCG tablet [id number]  TAKE 1 TABLET DAILY. NEW DOSE Delynn Flavin M, DO  Active   losartan-hydrochlorothiazide (HYZAAR) 100-25 MG tablet [id number] No Take 1 tablet by mouth daily. Delynn Flavin M, DO Taking Active   metFORMIN (GLUCOPHAGE) 1000 MG tablet [id number]  TAKE 1 TABLET TWICE DAILY WITH MEALS  Delynn Flavin M, DO  Active   Multiple Vitamin (MULTIVITAMIN WITH MINERALS) TABS tablet [id number] No Take 1 tablet by mouth daily. [provider] Taking Active Self            Assessment/Plan:   Diabetes: -Currently controlled A1c 6.9% - Reviewed long term cardiovascular and renal outcomes of uncontrolled blood sugar - Reviewed goal A1c, goal fasting, and goal 2 hour post prandial glucose - Reviewed dietary modifications including FOLLOWING A HEART HEALTHY DIET/HEALTHY PLATE METHOD - Reviewed lifestyle modifications including: - Recommend to continue current regimen  - Recommend to check glucose daily (fasting) or if symptomatic - Meets financial criteria for Jardiance patient assistance program through [facility] cares. Will collaborate with provider, CPhT, and patient to pursue assistance. PharmD completed entire portion and sent to CPhT  Follow Up Plan: as needed  Kieth Brightly, PharmD, BCACP, CPP Clinical Pharmacist, [facility]

## 2023-06-10 ENCOUNTER — Other Ambulatory Visit: Payer: Self-pay | Admitting: Family Medicine

## 2023-06-10 DIAGNOSIS — I700 Atherosclerosis of aorta: Secondary | ICD-10-CM

## 2023-06-10 DIAGNOSIS — E1169 Type 2 diabetes mellitus with other specified complication: Secondary | ICD-10-CM

## 2023-06-14 ENCOUNTER — Other Ambulatory Visit: Payer: Self-pay | Admitting: Family Medicine

## 2023-06-14 ENCOUNTER — Ambulatory Visit (INDEPENDENT_AMBULATORY_CARE_PROVIDER_SITE_OTHER): Payer: Medicare Other

## 2023-06-14 VITALS — Ht 71.0 in | Wt 162.0 lb

## 2023-06-14 DIAGNOSIS — Z0000 Encounter for general adult medical examination without abnormal findings: Secondary | ICD-10-CM

## 2023-06-14 DIAGNOSIS — I152 Hypertension secondary to endocrine disorders: Secondary | ICD-10-CM

## 2023-06-14 NOTE — Progress Notes (Signed)
 Subjective:   Matthew Daugherty is a 78 y.o. who presents for a Medicare Wellness preventive visit.  Visit Complete: Virtual I connected with  Matthew Daugherty on 06/14/23 by a audio enabled telemedicine application and verified that I am speaking with the correct person using two identifiers.  Patient Location: Home  Provider Location: Home Office  I discussed the limitations of evaluation and management by telemedicine. The patient expressed understanding and agreed to proceed.  Vital Signs: Because this visit was a virtual/telehealth visit, some criteria may be missing or patient reported. Any vitals not documented were not able to be obtained and vitals that have been documented are patient reported.  VideoDeclined- This patient declined Librarian, academic. Therefore the visit was completed with audio only.  AWV Questionnaire: No: Patient Medicare AWV questionnaire was not completed prior to this visit.  Cardiac Risk Factors include: advanced age (>43men, >83 women);hypertension;dyslipidemia;diabetes mellitus;male gender     Objective:    Today's Vitals   06/14/23 1328  Weight: 162 lb (73.5 kg)  Height: 5\' 11"  (1.803 m)   Body mass index is 22.59 kg/m.     06/14/2023    1:33 PM 06/08/2022    2:21 PM 11/13/2021    6:15 PM 06/06/2021   12:59 PM 06/04/2020    8:12 AM 06/01/2019    8:34 AM 05/30/2018    8:49 AM  Advanced Directives  Does Patient Have a Medical Advance Directive? No Yes Yes Yes No Yes Yes  Type of Special educational needs teacher of [place];Living will Healthcare Power of [place];Living will Healthcare Power of [employer] of [place];Living will Healthcare Power of Attorney  Does patient want to make changes to medical advance directive?   No - Patient declined  No - Patient declined No - Patient declined No - Patient declined  Copy of Healthcare Power of Attorney in Chart?  No - copy requested No - copy requested No - copy  requested  No - copy requested No - copy requested  Would patient like information on creating a medical advance directive? Yes (MAU/Ambulatory/Procedural Areas - Information given)    No - Patient declined      Current Medications (verified) Outpatient Encounter Medications as of 06/14/2023  Medication Sig   aspirin 81 MG EC tablet Take 81 mg by mouth daily.   atorvastatin (LIPITOR) 80 MG tablet TAKE 1 TABLET EVERY DAY   Cholecalciferol (VITAMIN D-3) 5000 UNITS TABS Take 1 tablet by mouth daily.   empagliflozin (JARDIANCE) 25 MG TABS tablet Take 1 tablet (25 mg total) by mouth daily before breakfast.   FeFum-FePoly-FA-B Cmp-C-Biot (INTEGRA PLUS) CAPS TAKE 1 CAPSULE EVERY DAY   fenofibrate 160 MG tablet TAKE 1 TABLET EVERY DAY   ketoconazole (NIZORAL) 2 % cream APPLY 1 APPLICATION TOPICALLY DAILY. TO RASH ON MUSTACHE AREA UNTIL RASH RESOLVED (2-4 WEEKS)   levothyroxine (SYNTHROID) 88 MCG tablet TAKE 1 TABLET DAILY. NEW DOSE   losartan-hydrochlorothiazide (HYZAAR) 100-25 MG tablet TAKE 1 TABLET EVERY DAY   metFORMIN (GLUCOPHAGE) 1000 MG tablet TAKE 1 TABLET TWICE DAILY WITH MEALS   Multiple Vitamin (MULTIVITAMIN WITH MINERALS) TABS tablet Take 1 tablet by mouth daily.   No facility-administered encounter medications on file as of 06/14/2023.    Allergies (verified) Niaspan [niacin er (antihyperlipidemic)]   History: Past Medical History:  Diagnosis Date   Cardiac dysrhythmia, unspecified    Diabetes mellitus without complication (HCC)    Essential hypertension, benign    Hyperplasia  of prostate    Other and unspecified hyperlipidemia    Thyroid disease    Unspecified transient cerebral ischemia    Past Surgical History:  Procedure Laterality Date   APPENDECTOMY     CHOLECYSTECTOMY     LAPAROSCOPIC CHOLECYSTECTOMY SINGLE SITE WITH INTRAOPERATIVE CHOLANGIOGRAM N/A 06/22/2015   Procedure: LAPAROSCOPIC CHOLECYSTECTOMY SINGLE SITE WITH INTRAOPERATIVE CHOLANGIOGRAM, PRIMARY UMBILICAL  HERNIA REPAIR;  Surgeon: Karie Soda, MD;  Location: [facility];  Service: General;  Laterality: N/A;   Family History  Problem Relation Age of Onset   Cancer Mother        bone   Asthma Father    Heart attack Father        88   Cirrhosis Brother    Social History   Socioeconomic History   Marital status: Married    Spouse name: Matthew Daugherty   Number of children: 1   Years of education: 12   Highest education level: High school graduate  Occupational History   Occupation: retired    Associate Professor: [employer]  Tobacco Use   Smoking status: Former    Current packs/day: 0.00    Average packs/day: 1 pack/day for 44.1 years (44.1 ttl pk-yrs)    Types: Cigarettes    Start date: 04/20/1954    Quit date: 06/13/1998    Years since quitting: 25.0    Passive exposure: Past   Smokeless tobacco: Never  Vaping Use   Vaping status: Never Used  Substance and Sexual Activity   Alcohol use: No   Drug use: No   Sexual activity: Yes  Other Topics Concern   Not on file  Social History Narrative   Patient lives with his wife in a 2 story home.  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  He has one son, and 2 grand daughters.   Social Drivers of Corporate investment banker Strain: Low Risk  (06/14/2023)   Overall Financial Resource Strain (CARDIA)    Difficulty of Paying Living Expenses: Not hard at all  Food Insecurity: No Food Insecurity (06/14/2023)   Hunger Vital Sign    Worried About Running Out of Food in the Last Year: Never true    Ran Out of Food in the Last Year: Never true  Transportation Needs: No Transportation Needs (06/14/2023)   PRAPARE - Administrator, Civil Service (Medical): No    Lack of Transportation (Non-Medical): No  Physical Activity: Insufficiently Active (06/14/2023)   Exercise Vital Sign    Days of Exercise per Week: 3 days    Minutes of Exercise per Session: 30 min  Stress: No Stress Concern Present (06/14/2023)    [employer] of Occupational Health - Occupational Stress Questionnaire    Feeling of Stress : Not at all  Social Connections: Moderately Integrated (06/14/2023)   Social Connection and Isolation Panel [NHANES]    Frequency of Communication with Friends and Family: More than three times a week    Frequency of Social Gatherings with Friends and Family: Three times a week    Attends Religious Services: More than 4 times per year    Active Member of Clubs or Organizations: No    Attends Banker Meetings: Never    Marital Status: Married    Tobacco Counseling Counseling given: Not Answered    Clinical Intake:  Pre-visit preparation completed: Yes  Pain : No/denies pain     Diabetes: Yes CBG done?: No Did pt. bring in CBG  monitor from home?: No  How often do you need to have someone help you when you read instructions, pamphlets, or other written materials from your doctor or pharmacy?: 1 - Never  Interpreter Needed?: No  Information entered by :: Kandis Fantasia LPN   Activities of Daily Living     06/14/2023    1:29 PM  In your present state of health, do you have any difficulty performing the following activities:  Hearing? 0  Vision? 0  Difficulty concentrating or making decisions? 0  Walking or climbing stairs? 0  Dressing or bathing? 0  Doing errands, shopping? 0  Preparing Food and eating ? N  Using the Toilet? N  In the past six months, have you accidently leaked urine? N  Do you have problems with loss of bowel control? N  Managing your Medications? N  Managing your Finances? N  Housekeeping or managing your Housekeeping? N    Patient Care Team: Raliegh Ip, DO as PCP - General (Family Medicine) Vida Rigger, MD (Gastroenterology) Rollene Rotunda, MD as Consulting Physician (Cardiology)  Indicate any recent Medical Services you may have received from other than [facility] providers in the past year (date may be approximate).      Assessment:   This is a routine wellness examination for Jacobo.  Hearing/Vision screen Hearing Screening - Comments:: Denies hearing difficulties   Vision Screening - Comments:: Wears rx glasses - up to date with routine eye exams with Dr. Conley Rolls     Goals Addressed   None    Depression Screen     06/14/2023    1:30 PM 03/15/2023    8:13 AM 09/04/2022   11:40 AM 06/08/2022    2:20 PM 06/05/2022    8:11 AM 12/01/2021    8:00 AM 11/13/2021    6:08 PM  PHQ 2/9 Scores  PHQ - 2 Score 0 0 0 0 0 0 0  PHQ- 9 Score  0 0 0 0 0     Fall Risk     06/14/2023    1:33 PM 03/15/2023    8:13 AM 09/04/2022   11:40 AM 06/08/2022    2:19 PM 06/05/2022    8:11 AM  Fall Risk   Falls in the past year? 0 0 0 0 0  Number falls in past yr: 0 0 0 0 0  Injury with Fall? 0 0 0 0 0  Risk for fall due to : No Fall Risks No Fall Risks No Fall Risks No Fall Risks No Fall Risks  Follow up Falls prevention discussed;Education provided;Falls evaluation completed Education provided Education provided Falls prevention discussed Education provided    MEDICARE RISK AT HOME:  Medicare Risk at Home Any stairs in or around the home?: No If so, are there any without handrails?: No Home free of loose throw rugs in walkways, pet beds, electrical cords, etc?: Yes Adequate lighting in your home to reduce risk of falls?: Yes Life alert?: No Use of a cane, walker or w/c?: No Grab bars in the bathroom?: Yes Shower chair or bench in shower?: No Elevated toilet seat or a handicapped toilet?: Yes  TIMED UP AND GO:  Was the test performed?  No  Cognitive Function: 6CIT completed    05/30/2018    8:59 AM 05/25/2017    8:14 AM 12/27/2015    8:45 AM  MMSE - Mini Mental State Exam  Orientation to time 5 5 5   Orientation to Place 5 5 5   Registration 3 3  3  Attention/ Calculation 4 5 5   Recall 3 3 3   Language- name 2 objects 2 2 2   Language- repeat 1 1 1   Language- follow 3 step command 3 3 3   Language- read & follow  direction 1 1 1   Write a sentence 1 1 1   Copy design 1 1 1   Total score 29 30 30         06/14/2023    1:33 PM 06/08/2022    2:21 PM 06/04/2020    8:14 AM 06/01/2019    8:36 AM  6CIT Screen  What Year? 0 points 0 points 0 points 0 points  What month? 0 points 0 points 0 points 0 points  What time? 0 points 0 points 0 points 0 points  Count back from 20 0 points 0 points 0 points 0 points  Months in reverse 0 points 0 points 0 points 0 points  Repeat phrase 0 points 0 points 0 points 0 points  Total Score 0 points 0 points 0 points 0 points    Immunizations Immunization History  Administered Date(s) Administered   Fluad Quad(high Dose 65+) 01/17/2019, 01/29/2020, 02/19/2021, 02/23/2022   Fluad Trivalent(High Dose 65+) 03/15/2023   Influenza Whole 11/18/2008   Influenza, High Dose Seasonal PF 02/22/2015, 03/09/2016, 04/30/2017, 02/17/2018   Influenza,inj,Quad PF,6+ Mos 04/11/2013, 04/19/2014   Moderna Sars-Covid-2 Vaccination 08/14/2019, 09/11/2019, 05/28/2020   Pneumococcal Conjugate-13 04/11/2013   Pneumococcal Polysaccharide-23 04/19/2014   Td 01/17/2019   Tdap 06/18/2008   Zoster Recombinant(Shingrix) 06/03/2021, 12/01/2021   Zoster, Live 04/30/2010    Screening Tests Health Maintenance  Topic Date Due   COVID-19 Vaccine (4 - 2024-25 season) 12/20/2022   Diabetic kidney evaluation - Urine ACR  06/06/2023   Hepatitis C Screening  09/04/2023 (Originally 04/09/1964)   COLON CANCER SCREENING ANNUAL FOBT  09/12/2023 (Originally 05/19/2019)   HEMOGLOBIN A1C  09/12/2023   OPHTHALMOLOGY EXAM  01/22/2024   Colonoscopy  02/22/2024   Diabetic kidney evaluation - eGFR measurement  03/14/2024   FOOT EXAM  03/14/2024   Medicare Annual Wellness (AWV)  06/13/2024   DTaP/Tdap/Td (3 - Td or Tdap) 01/16/2029   Pneumonia Vaccine 23+ Years old  Completed   INFLUENZA VACCINE  Completed   Zoster Vaccines- Shingrix  Completed   HPV VACCINES  Aged Out    Health Maintenance  Health  Maintenance Due  Topic Date Due   COVID-19 Vaccine (4 - 2024-25 season) 12/20/2022   Diabetic kidney evaluation - Urine ACR  06/06/2023    Additional Screening:  Vision Screening: Recommended annual ophthalmology exams for early detection of glaucoma and other disorders of the eye.  Dental Screening: Recommended annual dental exams for proper oral hygiene  Community Resource Referral / Chronic Care Management: CRR required this visit?  No   CCM required this visit?  No     Plan:     I have personally reviewed and noted the following in the patient's chart:   Medical and social history Use of alcohol, tobacco or illicit drugs  Current medications and supplements including opioid prescriptions. Patient is not currently taking opioid prescriptions. Functional ability and status Nutritional status Physical activity Advanced directives List of other physicians Hospitalizations, surgeries, and ER visits in previous 12 months Vitals Screenings to include cognitive, depression, and falls Referrals and appointments  In addition, I have reviewed and discussed with patient certain preventive protocols, quality metrics, and best practice recommendations. A written personalized care plan for preventive services as well as general preventive health recommendations  were provided to patient.     Kandis Fantasia [place], [place]   01/02/7828   After Visit Summary: (MyChart) Due to this being a telephonic visit, the after visit summary with patients personalized plan was offered to patient via MyChart   Notes: Nothing significant to report at this time.

## 2023-06-14 NOTE — Patient Instructions (Signed)
 Mr. Matthew Daugherty , Thank you for taking time to come for your Medicare Wellness Visit. I appreciate your ongoing commitment to your health goals. Please review the following plan we discussed and let me know if I can assist you in the future.   Referrals/Orders/Follow-Ups/Clinician Recommendations: Aim for 30 minutes of exercise or brisk walking, 6-8 glasses of water, and 5 servings of fruits and vegetables each day.  This is a list of the screening recommended for you and due dates:  Health Maintenance  Topic Date Due   COVID-19 Vaccine (4 - 2024-25 season) 12/20/2022   Yearly kidney health urinalysis for diabetes  06/06/2023   Hepatitis C Screening  09/04/2023*   Stool Blood Test  09/12/2023*   Hemoglobin A1C  09/12/2023   Eye exam for diabetics  01/22/2024   Colon Cancer Screening  02/22/2024   Yearly kidney function blood test for diabetes  03/14/2024   Complete foot exam   03/14/2024   Medicare Annual Wellness Visit  06/13/2024   DTaP/Tdap/Td vaccine (3 - Td or Tdap) 01/16/2029   Pneumonia Vaccine  Completed   Flu Shot  Completed   Zoster (Shingles) Vaccine  Completed   HPV Vaccine  Aged Out  *Topic was postponed. The date shown is not the original due date.    Advanced directives: (ACP Link)Information on Advanced Care Planning can be found at [facility] of [place] Advance Health Care Directives Advance Health Care Directives ([email or web address]   Next Medicare Annual Wellness Visit scheduled for next year: Yes

## 2023-06-23 ENCOUNTER — Other Ambulatory Visit: Payer: Self-pay | Admitting: Family Medicine

## 2023-06-23 ENCOUNTER — Encounter: Payer: Self-pay | Admitting: Family Medicine

## 2023-06-23 ENCOUNTER — Ambulatory Visit: Payer: Medicare Other | Admitting: Family Medicine

## 2023-06-23 VITALS — BP 118/58 | HR 65 | Temp 98.4°F | Ht 71.0 in | Wt 161.0 lb

## 2023-06-23 DIAGNOSIS — Z7984 Long term (current) use of oral hypoglycemic drugs: Secondary | ICD-10-CM | POA: Diagnosis not present

## 2023-06-23 DIAGNOSIS — E119 Type 2 diabetes mellitus without complications: Secondary | ICD-10-CM | POA: Diagnosis not present

## 2023-06-23 DIAGNOSIS — I152 Hypertension secondary to endocrine disorders: Secondary | ICD-10-CM

## 2023-06-23 DIAGNOSIS — E1122 Type 2 diabetes mellitus with diabetic chronic kidney disease: Secondary | ICD-10-CM

## 2023-06-23 DIAGNOSIS — E1169 Type 2 diabetes mellitus with other specified complication: Secondary | ICD-10-CM

## 2023-06-23 DIAGNOSIS — N1831 Chronic kidney disease, stage 3a: Secondary | ICD-10-CM

## 2023-06-23 DIAGNOSIS — E1159 Type 2 diabetes mellitus with other circulatory complications: Secondary | ICD-10-CM | POA: Diagnosis not present

## 2023-06-23 DIAGNOSIS — D508 Other iron deficiency anemias: Secondary | ICD-10-CM

## 2023-06-23 DIAGNOSIS — E039 Hypothyroidism, unspecified: Secondary | ICD-10-CM | POA: Diagnosis not present

## 2023-06-23 DIAGNOSIS — E785 Hyperlipidemia, unspecified: Secondary | ICD-10-CM

## 2023-06-23 LAB — BAYER DCA HB A1C WAIVED: HB A1C (BAYER DCA - WAIVED): 7.6 % — ABNORMAL HIGH (ref 4.8–5.6)

## 2023-06-23 LAB — LIPID PANEL

## 2023-06-23 NOTE — Progress Notes (Signed)
 Subjective: CC:DM PCP: Raliegh Ip, DO HPI:Matthew Daugherty is a 78 y.o. male presenting to clinic today for:  1. Type 2 Diabetes with hypertension, hyperlipidemia w/ CKD3a:  He is taking 25 mg of Jardiance daily.  This was a replacement for the metformin because he had rising, borderline blood sugars.  He has had no hypoglycemic episodes.  He admits that he is not exercising as regularly as he had been because of the weather and because he is afraid to leave his wife at home.  She has been suffering with some back issues that has caused her to have limited mobility.   Diabetes Health Maintenance Due  Topic Date Due   HEMOGLOBIN A1C  09/12/2023   OPHTHALMOLOGY EXAM  01/22/2024   FOOT EXAM  03/14/2024    Last A1c:  Lab Results  Component Value Date   HGBA1C 6.9 (H) 03/15/2023    ROS: Denies dizziness, LOC, polyuria, polydipsia, unintended weight loss/gain, foot ulcerations, numbness or tingling in extremities, shortness of breath or chest pain.  2. Hypothyroidism Compliant with thyroid medications.  No tremor, heart palpitations or changes in bowel habits reported   ROS: Per HPI  Allergies  Allergen Reactions   Niaspan [Niacin Er (Antihyperlipidemic)] Other (See Comments)    Increases blood sugars    Past Medical History:  Diagnosis Date   Cardiac dysrhythmia, unspecified    Diabetes mellitus without complication (HCC)    Essential hypertension, benign    Hyperplasia of prostate    Other and unspecified hyperlipidemia    Thyroid disease    Unspecified transient cerebral ischemia     Current Outpatient Medications:    aspirin 81 MG EC tablet, Take 81 mg by mouth daily., Disp: , Rfl:    atorvastatin (LIPITOR) 80 MG tablet, TAKE 1 TABLET EVERY DAY, Disp: 90 tablet, Rfl: 0   Cholecalciferol (VITAMIN D-3) 5000 UNITS TABS, Take 1 tablet by mouth daily., Disp: , Rfl:    empagliflozin (JARDIANCE) 25 MG TABS tablet, Take 1 tablet (25 mg total) by mouth daily before  breakfast., Disp: 90 tablet, Rfl: 3   FeFum-FePoly-FA-B Cmp-C-Biot (INTEGRA PLUS) CAPS, TAKE 1 CAPSULE EVERY DAY, Disp: 90 capsule, Rfl: 0   fenofibrate 160 MG tablet, TAKE 1 TABLET EVERY DAY, Disp: 90 tablet, Rfl: 0   ketoconazole (NIZORAL) 2 % cream, APPLY 1 APPLICATION TOPICALLY DAILY. TO RASH ON MUSTACHE AREA UNTIL RASH RESOLVED (2-4 WEEKS), Disp: 60 g, Rfl: 0   levothyroxine (SYNTHROID) 88 MCG tablet, TAKE 1 TABLET DAILY. NEW DOSE, Disp: 90 tablet, Rfl: 3   losartan-hydrochlorothiazide (HYZAAR) 100-25 MG tablet, TAKE 1 TABLET EVERY DAY, Disp: 90 tablet, Rfl: 0   metFORMIN (GLUCOPHAGE) 1000 MG tablet, TAKE 1 TABLET TWICE DAILY WITH MEALS, Disp: 180 tablet, Rfl: 0   Multiple Vitamin (MULTIVITAMIN WITH MINERALS) TABS tablet, Take 1 tablet by mouth daily., Disp: , Rfl:  Social History   Socioeconomic History   Marital status: Married    Spouse name: Steward Drone   Number of children: 1   Years of education: 12   Highest education level: High school graduate  Occupational History   Occupation: retired    Associate Professor: [employer]  Tobacco Use   Smoking status: Former    Current packs/day: 0.00    Average packs/day: 1 pack/day for 44.1 years (44.1 ttl pk-yrs)    Types: Cigarettes    Start date: 04/20/1954    Quit date: 06/13/1998    Years since quitting: 25.0    Passive exposure: Past  Smokeless tobacco: Never  Vaping Use   Vaping status: Never Used  Substance and Sexual Activity   Alcohol use: No   Drug use: No   Sexual activity: Yes  Other Topics Concern   Not on file  Social History Narrative   Patient lives with his wife in a 2 story home.  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  He has one son, and 2 grand daughters.   Social Drivers of Corporate investment banker Strain: Low Risk  (06/14/2023)   Overall Financial Resource Strain (CARDIA)    Difficulty of Paying Living Expenses: Not hard at all  Food Insecurity: No Food Insecurity  (06/14/2023)   Hunger Vital Sign    Worried About Running Out of Food in the Last Year: Never true    Ran Out of Food in the Last Year: Never true  Transportation Needs: No Transportation Needs (06/14/2023)   PRAPARE - Administrator, Civil Service (Medical): No    Lack of Transportation (Non-Medical): No  Physical Activity: Insufficiently Active (06/14/2023)   Exercise Vital Sign    Days of Exercise per Week: 3 days    Minutes of Exercise per Session: 30 min  Stress: No Stress Concern Present (06/14/2023)   [employer] of Occupational Health - Occupational Stress Questionnaire    Feeling of Stress : Not at all  Social Connections: Moderately Integrated (06/14/2023)   Social Connection and Isolation Panel [NHANES]    Frequency of Communication with Friends and Family: More than three times a week    Frequency of Social Gatherings with Friends and Family: Three times a week    Attends Religious Services: More than 4 times per year    Active Member of Clubs or Organizations: No    Attends Banker Meetings: Never    Marital Status: Married  Catering manager Violence: Not At Risk (06/14/2023)   Humiliation, Afraid, Rape, and Kick questionnaire    Fear of Current or Ex-Partner: No    Emotionally Abused: No    Physically Abused: No    Sexually Abused: No   Family History  Problem Relation Age of Onset   Cancer Mother        bone   Asthma Father    Heart attack Father        62   Cirrhosis Brother     Objective: Office vital signs reviewed. BP (!) 118/58   Pulse 65   Temp 98.4 F (36.9 C)   Ht 5\' 11"  (1.803 m)   Wt 161 lb (73 kg)   SpO2 99%   BMI 22.45 kg/m   Physical Examination:  General: Awake, alert, well nourished, No acute distress HEENT: sclera white, MMM Cardio: regular rate and rhythm, S1S2 heard, no murmurs appreciated Pulm: clear to auscultation bilaterally, no wheezes, rhonchi or rales; normal work of breathing on room air    Assessment/ Plan: 78 y.o. male   Diabetes mellitus treated with oral medication (HCC) - Plan: Bayer DCA Hb A1c Waived, Microalbumin / creatinine urine ratio, CMP14+EGFR  Chronic kidney disease, stage 3a (HCC) - Plan: CMP14+EGFR  Hypertension associated with type 2 diabetes mellitus (HCC) - Plan: CMP14+EGFR  Hyperlipidemia associated with type 2 diabetes mellitus (HCC) - Plan: CMP14+EGFR, Lipid Panel  Acquired hypothyroidism - Plan: TSH + free T4  Sugar not at goal rising to 7.6.  I am going to add back in 1000 mg of metformin daily and he  may continue the Jardiance 25 mg daily as he has been prescribed.  We will recheck renal function, urine microalbumin  Lipid panel collected.  Continue Lipitor as prescribed.  Check liver function tests and thyroid levels.  He is clinically euthyroid  Raliegh Ip, DO [facility] [phone]

## 2023-06-23 NOTE — Patient Instructions (Signed)
 Go back on 1 metformin daily with the jardiance

## 2023-06-24 LAB — CMP14+EGFR
ALT: 24 IU/L (ref 0–44)
AST: 31 IU/L (ref 0–40)
Albumin: 4.9 g/dL — ABNORMAL HIGH (ref 3.8–4.8)
Alkaline Phosphatase: 58 IU/L (ref 44–121)
BUN/Creatinine Ratio: 16 (ref 10–24)
BUN: 26 mg/dL (ref 8–27)
Bilirubin Total: 0.7 mg/dL (ref 0.0–1.2)
CO2: 21 mmol/L (ref 20–29)
Calcium: 10 mg/dL (ref 8.6–10.2)
Chloride: 100 mmol/L (ref 96–106)
Creatinine, Ser: 1.61 mg/dL — ABNORMAL HIGH (ref 0.76–1.27)
Globulin, Total: 2.1 g/dL (ref 1.5–4.5)
Glucose: 151 mg/dL — ABNORMAL HIGH (ref 70–99)
Potassium: 4.9 mmol/L (ref 3.5–5.2)
Sodium: 138 mmol/L (ref 134–144)
Total Protein: 7 g/dL (ref 6.0–8.5)
eGFR: 44 mL/min/{1.73_m2} — ABNORMAL LOW (ref >59)

## 2023-06-24 LAB — LIPID PANEL
Cholesterol, Total: 137 mg/dL (ref 100–199)
HDL: 50 mg/dL (ref >39)
LDL CALC COMMENT:: 2.7 ratio (ref 0.0–5.0)
LDL Chol Calc (NIH): 73 mg/dL (ref 0–99)
Triglycerides: 68 mg/dL (ref 0–149)
VLDL Cholesterol Cal: 14 mg/dL (ref 5–40)

## 2023-06-24 LAB — MICROALBUMIN / CREATININE URINE RATIO
Creatinine, Urine: 46.5 mg/dL
Microalb/Creat Ratio: <6 mg/g{creat} (ref 0–29)
Microalbumin, Urine: <3 ug/mL

## 2023-06-24 LAB — TSH+FREE T4
Free T4: 1.85 ng/dL — ABNORMAL HIGH (ref 0.82–1.77)
TSH: 2.46 u[IU]/mL (ref 0.450–4.500)

## 2023-06-25 ENCOUNTER — Encounter: Payer: Self-pay | Admitting: Family Medicine

## 2023-06-28 ENCOUNTER — Other Ambulatory Visit: Payer: Self-pay | Admitting: Family Medicine

## 2023-06-28 DIAGNOSIS — E039 Hypothyroidism, unspecified: Secondary | ICD-10-CM

## 2023-06-28 MED ORDER — LEVOTHYROXINE SODIUM 75 MCG PO TABS: ORAL_TABLET | ORAL | 0 refills | Status: DC

## 2023-07-17 ENCOUNTER — Other Ambulatory Visit: Payer: Self-pay | Admitting: Family Medicine

## 2023-07-17 DIAGNOSIS — E1169 Type 2 diabetes mellitus with other specified complication: Secondary | ICD-10-CM

## 2023-08-02 NOTE — Telephone Encounter (Signed)
 As of 08/02/23, per automated system application not processed yet. Will call to f/u.

## 2023-08-11 ENCOUNTER — Other Ambulatory Visit

## 2023-08-11 DIAGNOSIS — E039 Hypothyroidism, unspecified: Secondary | ICD-10-CM

## 2023-08-12 LAB — TSH+FREE T4
Free T4: 1.32 ng/dL (ref 0.82–1.77)
TSH: 8.71 u[IU]/mL — ABNORMAL HIGH (ref 0.450–4.500)

## 2023-08-13 ENCOUNTER — Encounter: Payer: Self-pay | Admitting: Family Medicine

## 2023-08-18 ENCOUNTER — Other Ambulatory Visit: Payer: Self-pay | Admitting: Family Medicine

## 2023-08-18 DIAGNOSIS — E1169 Type 2 diabetes mellitus with other specified complication: Secondary | ICD-10-CM

## 2023-08-18 DIAGNOSIS — I700 Atherosclerosis of aorta: Secondary | ICD-10-CM

## 2023-08-23 ENCOUNTER — Other Ambulatory Visit: Payer: Self-pay | Admitting: Family Medicine

## 2023-08-23 DIAGNOSIS — E1159 Type 2 diabetes mellitus with other circulatory complications: Secondary | ICD-10-CM

## 2023-09-06 ENCOUNTER — Other Ambulatory Visit (HOSPITAL_COMMUNITY): Payer: Self-pay

## 2023-09-06 ENCOUNTER — Other Ambulatory Visit: Payer: Self-pay | Admitting: Family Medicine

## 2023-09-06 DIAGNOSIS — E039 Hypothyroidism, unspecified: Secondary | ICD-10-CM

## 2023-09-06 NOTE — Telephone Encounter (Signed)
 Reached out to [employer] never rec'd. If still needed, patient will need to complete new application.  Looks like patient is picking up at the pharmacy. Unsure of the price.

## 2023-09-22 NOTE — Progress Notes (Deleted)
 Subjective: CC:DM PCP: Eliodoro Guerin, DO HPI:Matthew Daugherty is a 78 y.o. male presenting to clinic today for:  1. Type 2 Diabetes with hypertension, hyperlipidemia w/ CKD3a:  Glucometer:***.   Metformin  added back to regimen last OV due to uncontrolled DM. ***  Diabetes Health Maintenance Due  Topic Date Due   HEMOGLOBIN A1C  12/24/2023   OPHTHALMOLOGY EXAM  01/22/2024   FOOT EXAM  03/14/2024    Last A1c:  Lab Results  Component Value Date   HGBA1C 7.6 (H) 06/23/2023    ROS: ***dizziness, LOC, polyuria, polydipsia, unintended weight loss/gain, foot ulcerations, numbness or tingling in extremities, shortness of breath or chest pain.  2. Hypothyroidism Advised to take on weekdays and on weekends ***  ROS: Per HPI  Allergies  Allergen Reactions   Niaspan [Niacin Er (Antihyperlipidemic)] Other (See Comments)    Increases blood sugars    Past Medical History:  Diagnosis Date   Cardiac dysrhythmia, unspecified    Diabetes mellitus without complication (HCC)    Essential hypertension, benign    Hyperplasia of prostate    Other and unspecified hyperlipidemia    Thyroid  disease    Unspecified transient cerebral ischemia     Current Outpatient Medications:    aspirin  81 MG EC tablet, Take 81 mg by mouth daily., Disp: , Rfl:    atorvastatin  (LIPITOR) 80 MG tablet, TAKE 1 TABLET EVERY DAY, Disp: 90 tablet, Rfl: 0   Cholecalciferol  (VITAMIN D -3) 5000 UNITS TABS, Take 1 tablet by mouth daily., Disp: , Rfl:    empagliflozin  (JARDIANCE ) 25 MG TABS tablet, Take 1 tablet (25 mg total) by mouth daily before breakfast., Disp: 90 tablet, Rfl: 3   FeFum-FePoly-FA-B Cmp-C-Biot (INTEGRA PLUS ) CAPS, TAKE 1 CAPSULE EVERY DAY, Disp: 90 capsule, Rfl: 1   fenofibrate  160 MG tablet, TAKE 1 TABLET EVERY DAY, Disp: 90 tablet, Rfl: 0   ketoconazole  (NIZORAL ) 2 % cream, APPLY 1 APPLICATION TOPICALLY DAILY. TO RASH ON MUSTACHE AREA UNTIL RASH RESOLVED (2-4 WEEKS), Disp: 60 g,  Rfl: 0   levothyroxine  (SYNTHROID ) 75 MCG tablet, TAKE 1 TABLET EVERY DAY (NEW DOSE), Disp: 90 tablet, Rfl: 0   losartan -hydrochlorothiazide  (HYZAAR) 100-25 MG tablet, TAKE 1 TABLET EVERY DAY, Disp: 90 tablet, Rfl: 3   metFORMIN  (GLUCOPHAGE ) 1000 MG tablet, TAKE 1 TABLET TWICE DAILY WITH MEALS, Disp: 180 tablet, Rfl: 0   Multiple Vitamin (MULTIVITAMIN WITH MINERALS) TABS tablet, Take 1 tablet by mouth daily., Disp: , Rfl:  Social History   Socioeconomic History   Marital status: Married    Spouse name: Cornelius Dill   Number of children: 1   Years of education: 12   Highest education level: High school graduate  Occupational History   Occupation: retired    Associate Professor: [employer]  Tobacco Use   Smoking status: Former    Current packs/day: 0.00    Average packs/day: 1 pack/day for 44.1 years (44.1 ttl pk-yrs)    Types: Cigarettes    Start date: 04/20/1954    Quit date: 06/13/1998    Years since quitting: 25.2    Passive exposure: Past   Smokeless tobacco: Never  Vaping Use   Vaping status: Never Used  Substance and Sexual Activity   Alcohol use: No   Drug use: No   Sexual activity: Yes  Other Topics Concern   Not on file  Social History Narrative   Patient lives with his wife in a 2 story home.  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  He has one son, and 2 grand daughters.   Social Drivers of Corporate investment banker Strain: Low Risk  (06/14/2023)   Overall Financial Resource Strain (CARDIA)    Difficulty of Paying Living Expenses: Not hard at all  Food Insecurity: No Food Insecurity (06/14/2023)   Hunger Vital Sign    Worried About Running Out of Food in the Last Year: Never true    Ran Out of Food in the Last Year: Never true  Transportation Needs: No Transportation Needs (06/14/2023)   PRAPARE - Administrator, Civil Service (Medical): No    Lack of Transportation (Non-Medical): No  Physical Activity: Insufficiently  Active (06/14/2023)   Exercise Vital Sign    Days of Exercise per Week: 3 days    Minutes of Exercise per Session: 30 min  Stress: No Stress Concern Present (06/14/2023)   [employer] of Occupational Health - Occupational Stress Questionnaire    Feeling of Stress : Not at all  Social Connections: Moderately Integrated (06/14/2023)   Social Connection and Isolation Panel [NHANES]    Frequency of Communication with Friends and Family: More than three times a week    Frequency of Social Gatherings with Friends and Family: Three times a week    Attends Religious Services: More than 4 times per year    Active Member of Clubs or Organizations: No    Attends Banker Meetings: Never    Marital Status: Married  Catering manager Violence: Not At Risk (06/14/2023)   Humiliation, Afraid, Rape, and Kick questionnaire    Fear of Current or Ex-Partner: No    Emotionally Abused: No    Physically Abused: No    Sexually Abused: No   Family History  Problem Relation Age of Onset   Cancer Mother        bone   Asthma Father    Heart attack Father        33   Cirrhosis Brother     Objective: Office vital signs reviewed. There were no vitals taken for this visit.  Physical Examination:  General: Awake, alert, *** nourished, No acute distress HEENT: Normal    Neck: No masses palpated. No lymphadenopathy    Ears: Tympanic membranes intact, normal light reflex, no erythema, no bulging    Eyes: PERRLA, extraocular membranes intact, sclera ***    Nose: nasal turbinates moist, *** nasal discharge    Throat: moist mucus membranes, no erythema, *** tonsillar exudate.  Airway is patent Cardio: regular rate and rhythm, S1S2 heard, no murmurs appreciated Pulm: clear to auscultation bilaterally, no wheezes, rhonchi or rales; normal work of breathing on room air GI: soft, non-tender, non-distended, bowel sounds present x4, no hepatomegaly, no splenomegaly, no masses GU: external vaginal  tissue ***, cervix ***, *** punctate lesions on cervix appreciated, *** discharge from cervical os, *** bleeding, *** cervical motion tenderness, *** abdominal/ adnexal masses Extremities: warm, well perfused, No edema, cyanosis or clubbing; +*** pulses bilaterally MSK: *** gait and *** station Skin: dry; intact; no rashes or lesions Neuro: *** Strength and light touch sensation grossly intact, *** DTRs ***/4  Diabetic Foot Exam - Simple   No data filed      Assessment/ Plan: 78 y.o. male   Diabetes mellitus treated with oral medication (HCC)  Chronic kidney disease, stage 3a (HCC)  Hypertension associated with type 2 diabetes mellitus (HCC)  Hyperlipidemia associated with type 2 diabetes mellitus (HCC)  Aortic  atherosclerosis (HCC)  Acquired hypothyroidism   ***  Eliodoro Guerin, DO [facility] [phone]

## 2023-09-24 ENCOUNTER — Ambulatory Visit: Admitting: Family Medicine

## 2023-09-24 DIAGNOSIS — I700 Atherosclerosis of aorta: Secondary | ICD-10-CM

## 2023-09-24 DIAGNOSIS — E039 Hypothyroidism, unspecified: Secondary | ICD-10-CM

## 2023-09-24 DIAGNOSIS — E1169 Type 2 diabetes mellitus with other specified complication: Secondary | ICD-10-CM

## 2023-09-24 DIAGNOSIS — E119 Type 2 diabetes mellitus without complications: Secondary | ICD-10-CM

## 2023-09-24 DIAGNOSIS — E1159 Type 2 diabetes mellitus with other circulatory complications: Secondary | ICD-10-CM

## 2023-09-24 DIAGNOSIS — N1831 Chronic kidney disease, stage 3a: Secondary | ICD-10-CM

## 2023-09-28 ENCOUNTER — Encounter: Payer: Self-pay | Admitting: Family Medicine

## 2023-09-28 ENCOUNTER — Ambulatory Visit: Admitting: Family Medicine

## 2023-09-28 VITALS — BP 121/66 | HR 60 | Temp 97.9°F | Ht 71.0 in | Wt 161.4 lb

## 2023-09-28 DIAGNOSIS — E785 Hyperlipidemia, unspecified: Secondary | ICD-10-CM | POA: Diagnosis not present

## 2023-09-28 DIAGNOSIS — E119 Type 2 diabetes mellitus without complications: Secondary | ICD-10-CM | POA: Diagnosis not present

## 2023-09-28 DIAGNOSIS — Z7984 Long term (current) use of oral hypoglycemic drugs: Secondary | ICD-10-CM | POA: Diagnosis not present

## 2023-09-28 DIAGNOSIS — E1159 Type 2 diabetes mellitus with other circulatory complications: Secondary | ICD-10-CM

## 2023-09-28 DIAGNOSIS — I700 Atherosclerosis of aorta: Secondary | ICD-10-CM

## 2023-09-28 DIAGNOSIS — E1169 Type 2 diabetes mellitus with other specified complication: Secondary | ICD-10-CM

## 2023-09-28 DIAGNOSIS — N1831 Chronic kidney disease, stage 3a: Secondary | ICD-10-CM

## 2023-09-28 DIAGNOSIS — I152 Hypertension secondary to endocrine disorders: Secondary | ICD-10-CM | POA: Diagnosis not present

## 2023-09-28 DIAGNOSIS — E039 Hypothyroidism, unspecified: Secondary | ICD-10-CM | POA: Diagnosis not present

## 2023-09-28 LAB — BAYER DCA HB A1C WAIVED: HB A1C (BAYER DCA - WAIVED): 6.9 % — ABNORMAL HIGH (ref 4.8–5.6)

## 2023-09-28 MED ORDER — FENOFIBRATE 160 MG PO TABS: 160.0000 mg | ORAL_TABLET | Freq: Every day | ORAL | 3 refills | Status: DC

## 2023-09-28 MED ORDER — LEVOTHYROXINE SODIUM 75 MCG PO TABS: ORAL_TABLET | ORAL | 3 refills | Status: DC

## 2023-09-28 MED ORDER — ATORVASTATIN CALCIUM 80 MG PO TABS: 80.0000 mg | ORAL_TABLET | Freq: Every day | ORAL | 3 refills | Status: DC

## 2023-09-28 NOTE — Progress Notes (Signed)
 Subjective: CC:DM PCP: Eliodoro Guerin, DO HPI:Matthew Daugherty is a 78 y.o. male presenting to clinic today for:  1. Type 2 Diabetes with hypertension, hyperlipidemia:  Metformin  added back in March.  He was continued on Jardiance .  BGs have been relatively stable.  He has been doing well.  Diabetes Health Maintenance Due  Topic Date Due   HEMOGLOBIN A1C  12/24/2023   OPHTHALMOLOGY EXAM  01/22/2024   FOOT EXAM  03/14/2024    Last A1c:  Lab Results  Component Value Date   HGBA1C 7.6 (H) 06/23/2023    ROS: No chest pain, shortness of breath or dizziness.  No GI symptoms.   2.  Hypothyroidism Compliant with Synthroid .  No reports of tremor, heart palpitations or changes in bowel habits.  ROS: Per HPI  Allergies  Allergen Reactions   Niaspan [Niacin Er (Antihyperlipidemic)] Other (See Comments)    Increases blood sugars    Past Medical History:  Diagnosis Date   Cardiac dysrhythmia, unspecified    Diabetes mellitus without complication (HCC)    Essential hypertension, benign    Hyperplasia of prostate    Other and unspecified hyperlipidemia    Thyroid  disease    Unspecified transient cerebral ischemia     Current Outpatient Medications:    aspirin  81 MG EC tablet, Take 81 mg by mouth daily., Disp: , Rfl:    atorvastatin  (LIPITOR) 80 MG tablet, TAKE 1 TABLET EVERY DAY, Disp: 90 tablet, Rfl: 0   Cholecalciferol  (VITAMIN D -3) 5000 UNITS TABS, Take 1 tablet by mouth daily., Disp: , Rfl:    empagliflozin  (JARDIANCE ) 25 MG TABS tablet, Take 1 tablet (25 mg total) by mouth daily before breakfast., Disp: 90 tablet, Rfl: 3   FeFum-FePoly-FA-B Cmp-C-Biot (INTEGRA PLUS ) CAPS, TAKE 1 CAPSULE EVERY DAY, Disp: 90 capsule, Rfl: 1   fenofibrate  160 MG tablet, TAKE 1 TABLET EVERY DAY, Disp: 90 tablet, Rfl: 0   ketoconazole  (NIZORAL ) 2 % cream, APPLY 1 APPLICATION TOPICALLY DAILY. TO RASH ON MUSTACHE AREA UNTIL RASH RESOLVED (2-4 WEEKS), Disp: 60 g, Rfl: 0   levothyroxine   (SYNTHROID ) 75 MCG tablet, TAKE 1 TABLET EVERY DAY (NEW DOSE), Disp: 90 tablet, Rfl: 0   losartan -hydrochlorothiazide  (HYZAAR) 100-25 MG tablet, TAKE 1 TABLET EVERY DAY, Disp: 90 tablet, Rfl: 3   metFORMIN  (GLUCOPHAGE ) 1000 MG tablet, TAKE 1 TABLET TWICE DAILY WITH MEALS, Disp: 180 tablet, Rfl: 0   Multiple Vitamin (MULTIVITAMIN WITH MINERALS) TABS tablet, Take 1 tablet by mouth daily., Disp: , Rfl:  Social History   Socioeconomic History   Marital status: Married    Spouse name: Cornelius Dill   Number of children: 1   Years of education: 12   Highest education level: High school graduate  Occupational History   Occupation: retired    Associate Professor: [employer]  Tobacco Use   Smoking status: Former    Current packs/day: 0.00    Average packs/day: 1 pack/day for 44.1 years (44.1 ttl pk-yrs)    Types: Cigarettes    Start date: 04/20/1954    Quit date: 06/13/1998    Years since quitting: 25.3    Passive exposure: Past   Smokeless tobacco: Never  Vaping Use   Vaping status: Never Used  Substance and Sexual Activity   Alcohol use: No   Drug use: No   Sexual activity: Yes  Other Topics Concern   Not on file  Social History Narrative   Patient lives with his wife in a 2 story home.  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  He has one son, and 2 grand daughters.   Social Drivers of Corporate investment banker Strain: Low Risk  (06/14/2023)   Overall Financial Resource Strain (CARDIA)    Difficulty of Paying Living Expenses: Not hard at all  Food Insecurity: No Food Insecurity (06/14/2023)   Hunger Vital Sign    Worried About Running Out of Food in the Last Year: Never true    Ran Out of Food in the Last Year: Never true  Transportation Needs: No Transportation Needs (06/14/2023)   PRAPARE - Administrator, Civil Service (Medical): No    Lack of Transportation (Non-Medical): No  Physical Activity: Insufficiently Active (06/14/2023)    Exercise Vital Sign    Days of Exercise per Week: 3 days    Minutes of Exercise per Session: 30 min  Stress: No Stress Concern Present (06/14/2023)   [employer] of Occupational Health - Occupational Stress Questionnaire    Feeling of Stress : Not at all  Social Connections: Moderately Integrated (06/14/2023)   Social Connection and Isolation Panel [NHANES]    Frequency of Communication with Friends and Family: More than three times a week    Frequency of Social Gatherings with Friends and Family: Three times a week    Attends Religious Services: More than 4 times per year    Active Member of Clubs or Organizations: No    Attends Banker Meetings: Never    Marital Status: Married  Catering manager Violence: Not At Risk (06/14/2023)   Humiliation, Afraid, Rape, and Kick questionnaire    Fear of Current or Ex-Partner: No    Emotionally Abused: No    Physically Abused: No    Sexually Abused: No   Family History  Problem Relation Age of Onset   Cancer Mother        bone   Asthma Father    Heart attack Father        64   Cirrhosis Brother     Objective: Office vital signs reviewed. BP 121/66   Pulse 60   Temp 97.9 F (36.6 C)   Ht 5\' 11"  (1.803 m)   Wt 161 lb 6.4 oz (73.2 kg)   SpO2 99%   BMI 22.51 kg/m   Physical Examination:  General: Awake, alert, well nourished, No acute distress HEENT: sclera white, MMM.  No exophthalmos.  No goiter Cardio: regular rate and rhythm, S1S2 heard, no murmurs appreciated Pulm: clear to auscultation bilaterally, no wheezes, rhonchi or rales; normal work of breathing on room air   Assessment/ Plan: 78 y.o. male   Diabetes mellitus treated with oral medication (HCC) - Plan: Bayer DCA Hb A1c Waived  Hypertension associated with type 2 diabetes mellitus (HCC) - Plan: Renal Function Panel  Chronic kidney disease, stage 3a (HCC) - Plan: Renal Function Panel  Hyperlipidemia associated with type 2 diabetes mellitus (HCC) -  Plan: fenofibrate  160 MG tablet, atorvastatin  (LIPITOR) 80 MG tablet  Acquired hypothyroidism - Plan: TSH + free T4, levothyroxine  (SYNTHROID ) 75 MCG tablet  Aortic atherosclerosis (HCC) - Plan: fenofibrate  160 MG tablet, atorvastatin  (LIPITOR) 80 MG tablet   Sugar well-controlled now with A1c at 6.9.  No changes.  Continue metformin  daily along with Jardiance , Hyzaar, Lipitor and fenofibrate .  Medications have been renewed  Clinically euthymic but check thyroid  levels.  Synthroid  renewed  Eliodoro Guerin, DO [facility] [phone]

## 2023-09-29 ENCOUNTER — Ambulatory Visit: Payer: Self-pay | Admitting: Family Medicine

## 2023-09-29 LAB — RENAL FUNCTION PANEL
Albumin: 4.6 g/dL (ref 3.8–4.8)
BUN/Creatinine Ratio: 13 (ref 10–24)
BUN: 20 mg/dL (ref 8–27)
CO2: 21 mmol/L (ref 20–29)
Calcium: 9.6 mg/dL (ref 8.6–10.2)
Chloride: 98 mmol/L (ref 96–106)
Creatinine, Ser: 1.55 mg/dL — ABNORMAL HIGH (ref 0.76–1.27)
Glucose: 115 mg/dL — ABNORMAL HIGH (ref 70–99)
Phosphorus: 4 mg/dL (ref 2.8–4.1)
Potassium: 3.8 mmol/L (ref 3.5–5.2)
Sodium: 136 mmol/L (ref 134–144)
eGFR: 46 mL/min/{1.73_m2} — ABNORMAL LOW (ref >59)

## 2023-09-29 LAB — TSH+FREE T4
Free T4: 1.33 ng/dL (ref 0.82–1.77)
TSH: 6.3 u[IU]/mL — ABNORMAL HIGH (ref 0.450–4.500)

## 2023-09-30 ENCOUNTER — Other Ambulatory Visit: Payer: Self-pay | Admitting: Family Medicine

## 2023-09-30 DIAGNOSIS — E1169 Type 2 diabetes mellitus with other specified complication: Secondary | ICD-10-CM

## 2023-10-21 ENCOUNTER — Telehealth: Payer: Self-pay | Admitting: *Deleted

## 2023-10-21 NOTE — Telephone Encounter (Signed)
 No return ph# taken to call back, more than likely this is for a brace that we do not do unless the patient calls us  and gives us  the authorization to do so.    Copied from CRM [id number]. Topic: General - Other >> Oct 21, 2023  2:30 PM DeAngela L wrote: Reason for CRM: [facility] says they faxed a paper to the office on 09/12/23 and need the provider to sign the paper work and return it

## 2023-10-24 ENCOUNTER — Other Ambulatory Visit: Payer: Self-pay | Admitting: Family Medicine

## 2023-10-24 DIAGNOSIS — I700 Atherosclerosis of aorta: Secondary | ICD-10-CM

## 2023-10-24 DIAGNOSIS — E1169 Type 2 diabetes mellitus with other specified complication: Secondary | ICD-10-CM

## 2023-10-25 ENCOUNTER — Telehealth: Payer: Self-pay | Admitting: Family Medicine

## 2023-10-25 NOTE — Telephone Encounter (Unsigned)
 Copied from CRM [id number]. Topic: General - Other >> Oct 21, 2023  2:30 PM DeAngela L wrote: Reason for CRM: [facility] says they faxed a paper to the office on 09/12/23 and need the provider to sign the paper work and return it >> Oct 25, 2023 12:09 PM Selinda RAMAN wrote: Oneil with [facility] called back stating he still has not heard back from anyone. He states the call back number is [phone]. He would like to know if the prescription is going to be signed and sent back. He stated provider Rosaline Bruns. Please assist further

## 2023-10-26 NOTE — Telephone Encounter (Signed)
 NO ext was given to talk to [facility], only got a VM to [facility]. LMOVM that we do not do these forms unless the patient contacts us  to let us  know they are working with the company and that we will not be returning this form.

## 2023-11-01 ENCOUNTER — Telehealth: Payer: Self-pay | Admitting: Family Medicine

## 2023-11-01 NOTE — Telephone Encounter (Signed)
 Patient states that he does not need supplies. I spoke to the provider and she states their is no recollection of patient needing supplies. Please disregard any future correspondence.

## 2023-11-01 NOTE — Telephone Encounter (Signed)
 Copied from CRM [id number]. Topic: General - Other >> Oct 21, 2023  2:30 PM DeAngela L wrote: Reason for CRM: [facility] says they faxed a paper to the office on 09/12/23 and need the provider to sign the paper work and return it >> Oct 29, 2023  4:53 PM Fredrica W wrote: Received a call from [facility]. States they only received office notes. They are still missing a signed Rx. Was faxed 5/27.  >> Oct 25, 2023 12:09 PM Selinda RAMAN wrote: Oneil with [facility] called back stating he still has not heard back from anyone. He states the call back number is [phone]. He would like to know if the prescription is going to be signed and sent back. He stated provider Rosaline Bruns. Please assist further

## 2023-11-03 ENCOUNTER — Telehealth: Payer: Self-pay

## 2023-11-03 NOTE — Telephone Encounter (Signed)
 Please see previous note dated 11/01/2023

## 2023-11-03 NOTE — Telephone Encounter (Signed)
 Pt is not aware of this , please let them know if they call back please let them know pt is not apart of this

## 2023-11-03 NOTE — Telephone Encounter (Signed)
 Copied from CRM [id number]. Topic: General - Other >> Oct 21, 2023  2:30 PM DeAngela L wrote: Reason for CRM: [facility] says they faxed a paper to the office on 09/12/23 and need the provider to sign the paper work and return it >> Nov 03, 2023 12:44 PM Tobias CROME wrote: Oneil with [facility] calling to check on status of request for supplies. Oneil states they are needing a signature on prescription as soon as possible.    >> Oct 29, 2023  4:53 PM Fredrica W wrote: Received a call from [facility]. States they only received office notes. They are still missing a signed Rx. Was faxed 5/27.  >> Oct 25, 2023 12:09 PM Selinda RAMAN wrote: Oneil with [facility] called back stating he still has not heard back from anyone. He states the call back number is [phone]. He would like to know if the prescription is going to be signed and sent back. He stated provider Rosaline Bruns. Please assist further

## 2023-11-04 NOTE — Telephone Encounter (Signed)
 LMOVM to [facility] that we have received several calls on fax for this pt and that we will not be returning this fax as that we do not do this and that the pt does not want this.

## 2023-11-11 ENCOUNTER — Telehealth: Payer: Self-pay | Admitting: Family Medicine

## 2023-11-11 NOTE — Telephone Encounter (Signed)
 Got a Museum/gallery conservator VM did not leave a message, per 11/03/23 TC pt is not aware of this, we will not be authorizing this which has been stated on several occasions.

## 2023-11-11 NOTE — Telephone Encounter (Signed)
 Copied from CRM [id number]. Topic: General - Other >> Nov 11, 2023  2:20 PM Yolanda T wrote: Reason for CRM: Oneil from [employer] called stated they need the providers signature on the form that was sent on 5/27. Please f/u with Oneil and fax# [phone] as soon as possible

## 2023-11-18 ENCOUNTER — Other Ambulatory Visit: Payer: Self-pay | Admitting: Family Medicine

## 2023-11-18 DIAGNOSIS — D508 Other iron deficiency anemias: Secondary | ICD-10-CM

## 2024-01-28 ENCOUNTER — Other Ambulatory Visit: Payer: Self-pay | Admitting: Family Medicine

## 2024-01-28 DIAGNOSIS — N1831 Chronic kidney disease, stage 3a: Secondary | ICD-10-CM

## 2024-01-28 DIAGNOSIS — E119 Type 2 diabetes mellitus without complications: Secondary | ICD-10-CM

## 2024-02-15 DIAGNOSIS — Z860101 Personal history of adenomatous and serrated colon polyps: Secondary | ICD-10-CM | POA: Diagnosis not present

## 2024-02-15 DIAGNOSIS — D649 Anemia, unspecified: Secondary | ICD-10-CM | POA: Diagnosis not present

## 2024-02-15 DIAGNOSIS — Z800 Family history of malignant neoplasm of digestive organs: Secondary | ICD-10-CM | POA: Diagnosis not present

## 2024-02-18 ENCOUNTER — Ambulatory Visit (INDEPENDENT_AMBULATORY_CARE_PROVIDER_SITE_OTHER)

## 2024-02-18 DIAGNOSIS — Z23 Encounter for immunization: Secondary | ICD-10-CM

## 2024-02-23 ENCOUNTER — Other Ambulatory Visit: Payer: Self-pay | Admitting: *Deleted

## 2024-02-23 DIAGNOSIS — E1169 Type 2 diabetes mellitus with other specified complication: Secondary | ICD-10-CM

## 2024-03-08 DIAGNOSIS — Z860101 Personal history of adenomatous and serrated colon polyps: Secondary | ICD-10-CM | POA: Diagnosis not present

## 2024-03-08 DIAGNOSIS — K5730 Diverticulosis of large intestine without perforation or abscess without bleeding: Secondary | ICD-10-CM | POA: Diagnosis not present

## 2024-03-08 DIAGNOSIS — D123 Benign neoplasm of transverse colon: Secondary | ICD-10-CM | POA: Diagnosis not present

## 2024-03-08 DIAGNOSIS — Z800 Family history of malignant neoplasm of digestive organs: Secondary | ICD-10-CM | POA: Diagnosis not present

## 2024-03-08 DIAGNOSIS — Z09 Encounter for follow-up examination after completed treatment for conditions other than malignant neoplasm: Secondary | ICD-10-CM | POA: Diagnosis not present

## 2024-03-08 LAB — HM COLONOSCOPY

## 2024-03-10 DIAGNOSIS — D123 Benign neoplasm of transverse colon: Secondary | ICD-10-CM | POA: Diagnosis not present

## 2024-04-05 ENCOUNTER — Encounter: Payer: Self-pay | Admitting: Family Medicine

## 2024-04-05 VITALS — BP 122/60 | HR 42 | Temp 97.9°F | Ht 71.0 in | Wt 163.0 lb

## 2024-04-05 DIAGNOSIS — N400 Enlarged prostate without lower urinary tract symptoms: Secondary | ICD-10-CM | POA: Diagnosis not present

## 2024-04-05 DIAGNOSIS — E119 Type 2 diabetes mellitus without complications: Secondary | ICD-10-CM

## 2024-04-05 DIAGNOSIS — Z7984 Long term (current) use of oral hypoglycemic drugs: Secondary | ICD-10-CM

## 2024-04-05 DIAGNOSIS — N1831 Chronic kidney disease, stage 3a: Secondary | ICD-10-CM | POA: Diagnosis not present

## 2024-04-05 DIAGNOSIS — D508 Other iron deficiency anemias: Secondary | ICD-10-CM

## 2024-04-05 DIAGNOSIS — E1159 Type 2 diabetes mellitus with other circulatory complications: Secondary | ICD-10-CM | POA: Diagnosis not present

## 2024-04-05 DIAGNOSIS — E1169 Type 2 diabetes mellitus with other specified complication: Secondary | ICD-10-CM | POA: Diagnosis not present

## 2024-04-05 DIAGNOSIS — I152 Hypertension secondary to endocrine disorders: Secondary | ICD-10-CM

## 2024-04-05 DIAGNOSIS — E785 Hyperlipidemia, unspecified: Secondary | ICD-10-CM

## 2024-04-05 DIAGNOSIS — E039 Hypothyroidism, unspecified: Secondary | ICD-10-CM

## 2024-04-05 MED ORDER — LOSARTAN POTASSIUM-HCTZ 100-25 MG PO TABS: 1.0000 | ORAL_TABLET | Freq: Every day | ORAL | 3 refills | Status: AC

## 2024-04-05 MED ORDER — LEVOTHYROXINE SODIUM 75 MCG PO TABS: ORAL_TABLET | ORAL | 3 refills | Status: AC

## 2024-04-05 MED ORDER — EMPAGLIFLOZIN 25 MG PO TABS: 25.0000 mg | ORAL_TABLET | Freq: Every day | ORAL | 3 refills | Status: AC

## 2024-04-05 MED ORDER — ATORVASTATIN CALCIUM 80 MG PO TABS: 80.0000 mg | ORAL_TABLET | Freq: Every day | ORAL | 3 refills | Status: AC

## 2024-04-05 MED ORDER — INTEGRA PLUS PO CAPS: 1.0000 | ORAL_CAPSULE | Freq: Every day | ORAL | 3 refills | Status: AC

## 2024-04-05 MED ORDER — METFORMIN HCL 1000 MG PO TABS: 1000.0000 mg | ORAL_TABLET | Freq: Every day | ORAL | 3 refills | Status: AC

## 2024-04-05 MED ORDER — FENOFIBRATE 160 MG PO TABS: 160.0000 mg | ORAL_TABLET | Freq: Every day | ORAL | 3 refills | Status: AC

## 2024-04-05 NOTE — Progress Notes (Signed)
 Matthew Daugherty is a 78 y.o. male presents to office today for annual physical exam examination.     Type 2 Diabetes with hypertension, hyperlipidemia:  Patient is compliant with his medications.  He reports no dysuria, hematuria, discoloration in the genital area.  He is only taking metformin  1000 mg daily and this is being tolerated well.  Reports no GI side effects.  Compliant with statin and fenofibrate .  No hypoglycemia.  Average blood sugars are running 120s.  Had diabetic eye exam with Dr. Ladora at [facility]  Last eye exam: UTD Last foot exam: needs Last A1c:  Lab Results  Component Value Date   HGBA1C 6.9 (H) 09/28/2023   Nephropathy screen indicated?: needs Last flu, zoster and/or pneumovax:  Immunization History  Administered Date(s) Administered   Fluad Quad(high Dose 65+) 01/17/2019, 01/29/2020, 02/19/2021, 02/23/2022   Fluad Trivalent(High Dose 65+) 03/15/2023   INFLUENZA, HIGH DOSE SEASONAL PF 02/22/2015, 03/09/2016, 04/30/2017, 02/17/2018, 02/18/2024   Influenza Whole 11/18/2008   Influenza,inj,Quad PF,6+ Mos 04/11/2013, 04/19/2014   Moderna Sars-Covid-2 Vaccination 08/14/2019, 09/11/2019, 05/28/2020   Pneumococcal Conjugate-13 04/11/2013   Pneumococcal Polysaccharide-23 04/19/2014   Td 01/17/2019   Tdap 06/18/2008   Zoster Recombinant(Shingrix ) 06/03/2021, 12/01/2021   Zoster, Live 04/30/2010    ROS: Denies dizziness, LOC, polyuria, polydipsia, unintended weight loss/gain, foot ulcerations, numbness or tingling in extremities, shortness of breath or chest pain.   Occupation: Retired, Marital status: Married to [place], Substance use: None Health Maintenance Due  Topic Date Due   OPHTHALMOLOGY EXAM  01/22/2024   HEMOGLOBIN A1C  03/29/2024    Immunization History  Administered Date(s) Administered   Fluad Quad(high Dose 65+) 01/17/2019, 01/29/2020, 02/19/2021, 02/23/2022   Fluad Trivalent(High Dose 65+) 03/15/2023   INFLUENZA, HIGH DOSE SEASONAL PF 02/22/2015,  03/09/2016, 04/30/2017, 02/17/2018, 02/18/2024   Influenza Whole 11/18/2008   Influenza,inj,Quad PF,6+ Mos 04/11/2013, 04/19/2014   Moderna Sars-Covid-2 Vaccination 08/14/2019, 09/11/2019, 05/28/2020   Pneumococcal Conjugate-13 04/11/2013   Pneumococcal Polysaccharide-23 04/19/2014   Td 01/17/2019   Tdap 06/18/2008   Zoster Recombinant(Shingrix ) 06/03/2021, 12/01/2021   Zoster, Live 04/30/2010   Past Medical History:  Diagnosis Date   Cardiac dysrhythmia, unspecified    Diabetes mellitus without complication (HCC)    Essential hypertension, benign    Hyperplasia of prostate    Other and unspecified hyperlipidemia    Thyroid  disease    Unspecified transient cerebral ischemia    Social History   Socioeconomic History   Marital status: Married    Spouse name: Erminio   Number of children: 1   Years of education: 12   Highest education level: High school graduate  Occupational History   Occupation: retired    Associate Professor: [employer]  Tobacco Use   Smoking status: Former    Current packs/day: 0.00    Average packs/day: 1 pack/day for 44.1 years (44.1 ttl pk-yrs)    Types: Cigarettes    Start date: 04/20/1954    Quit date: 06/13/1998    Years since quitting: 25.8    Passive exposure: Past   Smokeless tobacco: Never  Vaping Use   Vaping status: Never Used  Substance and Sexual Activity   Alcohol use: No   Drug use: No   Sexual activity: Yes  Other Topics Concern   Not on file  Social History Narrative   Patient lives with his wife in a 2 story home.  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  He has one son, and 2 grand  daughters.   Social Drivers of Health   Tobacco Use: Medium Risk (04/05/2024)   Patient History    Smoking Tobacco Use: Former    Smokeless Tobacco Use: Never    Passive Exposure: Past  Physicist, Medical Strain: Low Risk (06/14/2023)   Overall Financial Resource Strain (CARDIA)    Difficulty of Paying Living  Expenses: Not hard at all  Food Insecurity: No Food Insecurity (06/14/2023)   Hunger Vital Sign    Worried About Running Out of Food in the Last Year: Never true    Ran Out of Food in the Last Year: Never true  Transportation Needs: No Transportation Needs (06/14/2023)   PRAPARE - Administrator, Civil Service (Medical): No    Lack of Transportation (Non-Medical): No  Physical Activity: Insufficiently Active (06/14/2023)   Exercise Vital Sign    Days of Exercise per Week: 3 days    Minutes of Exercise per Session: 30 min  Stress: No Stress Concern Present (06/14/2023)   [employer] of Occupational Health - Occupational Stress Questionnaire    Feeling of Stress : Not at all  Social Connections: Moderately Integrated (06/14/2023)   Social Connection and Isolation Panel    Frequency of Communication with Friends and Family: More than three times a week    Frequency of Social Gatherings with Friends and Family: Three times a week    Attends Religious Services: More than 4 times per year    Active Member of Clubs or Organizations: No    Attends Banker Meetings: Never    Marital Status: Married  Catering Manager Violence: Not At Risk (06/14/2023)   Humiliation, Afraid, Rape, and Kick questionnaire    Fear of Current or Ex-Partner: No    Emotionally Abused: No    Physically Abused: No    Sexually Abused: No  Depression (PHQ2-9): Low Risk (04/05/2024)   Depression (PHQ2-9)    PHQ-2 Score: 0  Alcohol Screen: Low Risk (06/14/2023)   Alcohol Screen    Last Alcohol Screening Score (AUDIT): 0  Housing: Unknown (06/14/2023)   Housing Stability Vital Sign    Unable to Pay for Housing in the Last Year: No    Number of Times Moved in the Last Year: Not on file    Homeless in the Last Year: No  Utilities: Not At Risk (06/14/2023)   AHC Utilities    Threatened with loss of utilities: No  Health Literacy: Adequate Health Literacy (06/14/2023)   B1300 Health Literacy     Frequency of need for help with medical instructions: Never   Past Surgical History:  Procedure Laterality Date   APPENDECTOMY     CHOLECYSTECTOMY     LAPAROSCOPIC CHOLECYSTECTOMY SINGLE SITE WITH INTRAOPERATIVE CHOLANGIOGRAM N/A 06/22/2015   Procedure: LAPAROSCOPIC CHOLECYSTECTOMY SINGLE SITE WITH INTRAOPERATIVE CHOLANGIOGRAM, PRIMARY UMBILICAL HERNIA REPAIR;  Surgeon: Elspeth Schultze, MD;  Location: [facility];  Service: General;  Laterality: N/A;   Family History  Problem Relation Age of Onset   Cancer Mother        bone   Asthma Father    Heart attack Father        19   Cirrhosis Brother    Current Medications[1]  Allergies[2]   ROS: Review of Systems Pertinent items noted in HPI and remainder of comprehensive ROS otherwise negative.    Physical exam BP 122/60   Pulse (!) 42   Temp 97.9 F (36.6 C)   Ht 5' 11 (1.803 m)   Wt 163 lb (  73.9 kg)   SpO2 99%   BMI 22.73 kg/m  General appearance: alert, cooperative, appears stated age, and no distress Head: Normocephalic, without obvious abnormality, atraumatic Eyes: negative findings: lids and lashes normal, conjunctivae and sclerae normal, corneas clear, and pupils equal, round, reactive to light and accomodation Ears: Mild cerumen in external auditory canals bilaterally Nose: Nares normal. Septum midline. Mucosa normal. No drainage or sinus tenderness. Throat: lips, mucosa, and tongue normal; teeth and gums normal Neck: no adenopathy, no carotid bruit, supple, symmetrical, trachea midline, and thyroid  not enlarged, symmetric, no tenderness/mass/nodules Back: symmetric, no curvature. ROM normal. No CVA tenderness. Lungs: clear to auscultation bilaterally Chest wall: no tenderness Heart: regular rate and rhythm, S1, S2 normal, no murmur, click, rub or gallop Abdomen: soft, non-tender; bowel sounds normal; no masses,  no organomegaly Extremities: extremities normal, atraumatic, no cyanosis or edema Pulses: 2+ and symmetric Skin:  Onychomycotic changes to the feet bilaterally as below Lymph nodes: No supraclavicular or anterior cervical lymph node enlargement Neurologic: Alert and oriented X 3, normal strength and tone. Normal symmetric reflexes. Normal coordination and gait      04/05/2024   11:01 AM 09/28/2023    2:59 PM 06/23/2023    8:24 AM  Depression screen PHQ 2/9  Decreased Interest 0 0 0  Down, Depressed, Hopeless 0 0 0  PHQ - 2 Score 0 0 0  Altered sleeping 0 0 0  Tired, decreased energy 0 0 0  Change in appetite 0 0 0  Feeling bad or failure about yourself  0 0 0  Trouble concentrating 0 0 0  Moving slowly or fidgety/restless 0 0 0  Suicidal thoughts 0 0 0  PHQ-9 Score 0 0  0   Difficult doing work/chores Not difficult at all Not difficult at all Not difficult at all     Data saved with a previous flowsheet row definition      04/05/2024   11:01 AM 09/28/2023    2:59 PM 06/23/2023    8:24 AM 03/15/2023    8:14 AM  GAD 7 : Generalized Anxiety Score  Nervous, Anxious, on Edge 0 0 0 0  Control/stop worrying 0 0 0 0  Worry too much - different things 0 0 0 0  Trouble relaxing 0 0 0 0  Restless 0 0 0 0  Easily annoyed or irritable 0 0 0 0  Afraid - awful might happen 0 0 0 0  Total GAD 7 Score 0 0 0 0  Anxiety Difficulty Not difficult at all Not difficult at all  Not difficult at all   Diabetic Foot Exam - Simple   Simple Foot Form Diabetic Foot exam was performed with the following findings: Yes 04/05/2024 11:19 AM  Visual Inspection See comments: Yes Sensation Testing Intact to touch and monofilament testing bilaterally: Yes Pulse Check Posterior Tibialis and Dorsalis pulse intact bilaterally: Yes Comments Very sensitive feet.  He has onychomycotic changes to the nails and to the skin of the feet but no ulcerations or calluses appreciated      Assessment/ Plan: Matthew Daugherty here for annual physical exam.   Diabetes mellitus treated with oral medication (HCC) - Plan: CMP14+EGFR,  Microalbumin/Creatinine Ratio, Urine, empagliflozin  (JARDIANCE ) 25 MG TABS tablet, metFORMIN  (GLUCOPHAGE ) 1000 MG tablet  Chronic kidney disease, stage 3a (HCC) - Plan: CMP14+EGFR, Microalbumin/Creatinine Ratio, Urine, CBC with Differential/Platelet, VITAMIN D  25 Hydroxy (Vit-D Deficiency, Fractures), empagliflozin  (JARDIANCE ) 25 MG TABS tablet, losartan -hydrochlorothiazide  (HYZAAR) 100-25 MG tablet  Hyperlipidemia associated with type  2 diabetes mellitus (HCC) - Plan: CMP14+EGFR, Lipid Panel, atorvastatin  (LIPITOR) 80 MG tablet, fenofibrate  160 MG tablet  Hypertension associated with type 2 diabetes mellitus (HCC) - Plan: CMP14+EGFR, losartan -hydrochlorothiazide  (HYZAAR) 100-25 MG tablet  Acquired hypothyroidism - Plan: CMP14+EGFR, TSH + free T4, levothyroxine  (SYNTHROID ) 75 MCG tablet  Benign prostatic hyperplasia without lower urinary tract symptoms - Plan: PSA, CMP14+EGFR  Iron deficiency anemia secondary to inadequate dietary iron intake - Plan: FeFum-FePoly-FA-B Cmp-C-Biot (INTEGRA PLUS ) CAPS   Sugar well-controlled.  Continue current regimen.  Medications updated as to reflect current usage.  Urine microalbumin collected.  ROI for diabetic eye exam performed.  Check renal function, CBC and vitamin D  given CKD.  ARB and Jardiance  renewed.  Continue statin.  Check lipid panel  Blood pressure well-controlled.  No changes.  Meds sent  Clinically euthyroid.  No changes.  Synthroid  renewed.  Check thyroid  levels  Check PSA given history of BPH  Iron ordered.  Counseled on healthy lifestyle choices, including diet (rich in fruits, vegetables and lean meats and low in salt and simple carbohydrates) and exercise (at least 30 minutes of moderate physical activity daily).  Patient to follow up 59m for DM  Matthew Reen M. Davinity Fanara, DO        [1]  Current Outpatient Medications:    aspirin  81 MG EC tablet, Take 81 mg by mouth daily., Disp: , Rfl:    Cholecalciferol  (VITAMIN D -3) 5000  UNITS TABS, Take 1 tablet by mouth daily., Disp: , Rfl:    Multiple Vitamin (MULTIVITAMIN WITH MINERALS) TABS tablet, Take 1 tablet by mouth daily., Disp: , Rfl:    atorvastatin  (LIPITOR) 80 MG tablet, Take 1 tablet (80 mg total) by mouth daily., Disp: 100 tablet, Rfl: 3   empagliflozin  (JARDIANCE ) 25 MG TABS tablet, Take 1 tablet (25 mg total) by mouth daily., Disp: 100 tablet, Rfl: 3   FeFum-FePoly-FA-B Cmp-C-Biot (INTEGRA PLUS ) CAPS, Take 1 capsule by mouth daily., Disp: 100 capsule, Rfl: 3   fenofibrate  160 MG tablet, Take 1 tablet (160 mg total) by mouth daily., Disp: 100 tablet, Rfl: 3   ketoconazole  (NIZORAL ) 2 % cream, APPLY 1 APPLICATION TOPICALLY DAILY. TO RASH ON MUSTACHE AREA UNTIL RASH RESOLVED (2-4 WEEKS) (Patient not taking: Reported on 04/05/2024), Disp: 60 g, Rfl: 0   levothyroxine  (SYNTHROID ) 75 MCG tablet, TAKE 1 TABLET EVERY DAY (NEW DOSE), Disp: 100 tablet, Rfl: 3   losartan -hydrochlorothiazide  (HYZAAR) 100-25 MG tablet, Take 1 tablet by mouth daily., Disp: 100 tablet, Rfl: 3   metFORMIN  (GLUCOPHAGE ) 1000 MG tablet, Take 1 tablet (1,000 mg total) by mouth daily with breakfast., Disp: 100 tablet, Rfl: 3 [2]  Allergies Allergen Reactions   Niaspan [Niacin Er (Antihyperlipidemic)] Other (See Comments)    Increases blood sugars

## 2024-04-06 LAB — CBC WITH DIFFERENTIAL/PLATELET
Basophils Absolute: 0 x10E3/uL (ref 0.0–0.2)
Basos: 1 %
EOS (ABSOLUTE): 0.1 x10E3/uL (ref 0.0–0.4)
Eos: 1 %
Hematocrit: 42.2 % (ref 37.5–51.0)
Hemoglobin: 13.8 g/dL (ref 13.0–17.7)
Immature Grans (Abs): 0 x10E3/uL (ref 0.0–0.1)
Immature Granulocytes: 0 %
Lymphocytes Absolute: 1.8 x10E3/uL (ref 0.7–3.1)
Lymphs: 26 %
MCH: 30.9 pg (ref 26.6–33.0)
MCHC: 32.7 g/dL (ref 31.5–35.7)
MCV: 94 fL (ref 79–97)
Monocytes Absolute: 0.5 x10E3/uL (ref 0.1–0.9)
Monocytes: 8 %
Neutrophils Absolute: 4.4 x10E3/uL (ref 1.4–7.0)
Neutrophils: 64 %
Platelets: 320 x10E3/uL (ref 150–450)
RBC: 4.47 x10E6/uL (ref 4.14–5.80)
RDW: 12.3 % (ref 11.6–15.4)
WBC: 6.8 x10E3/uL (ref 3.4–10.8)

## 2024-04-06 LAB — CMP14+EGFR
ALT: 24 IU/L (ref 0–44)
AST: 29 IU/L (ref 0–40)
Albumin: 4.7 g/dL (ref 3.8–4.8)
Alkaline Phosphatase: 61 IU/L (ref 47–123)
BUN/Creatinine Ratio: 19 (ref 10–24)
BUN: 28 mg/dL — ABNORMAL HIGH (ref 8–27)
Bilirubin Total: 0.5 mg/dL (ref 0.0–1.2)
CO2: 25 mmol/L (ref 20–29)
Calcium: 9.7 mg/dL (ref 8.6–10.2)
Chloride: 98 mmol/L (ref 96–106)
Creatinine, Ser: 1.45 mg/dL — ABNORMAL HIGH (ref 0.76–1.27)
Globulin, Total: 2.2 g/dL (ref 1.5–4.5)
Glucose: 138 mg/dL — ABNORMAL HIGH (ref 70–99)
Potassium: 4.5 mmol/L (ref 3.5–5.2)
Sodium: 136 mmol/L (ref 134–144)
Total Protein: 6.9 g/dL (ref 6.0–8.5)
eGFR: 50 mL/min/1.73 — ABNORMAL LOW (ref >59)

## 2024-04-06 LAB — TSH+FREE T4
Free T4: 1.34 ng/dL (ref 0.82–1.77)
TSH: 6.28 u[IU]/mL — ABNORMAL HIGH (ref 0.450–4.500)

## 2024-04-06 LAB — LIPID PANEL
Chol/HDL Ratio: 2.5 ratio (ref 0.0–5.0)
Cholesterol, Total: 137 mg/dL (ref 100–199)
HDL: 55 mg/dL (ref >39)
LDL Chol Calc (NIH): 65 mg/dL (ref 0–99)
Triglycerides: 89 mg/dL (ref 0–149)
VLDL Cholesterol Cal: 17 mg/dL (ref 5–40)

## 2024-04-06 LAB — VITAMIN D 25 HYDROXY (VIT D DEFICIENCY, FRACTURES): Vit D, 25-Hydroxy: 48.7 ng/mL (ref 30.0–100.0)

## 2024-04-06 LAB — PSA: Prostate Specific Ag, Serum: 2 ng/mL (ref 0.0–4.0)

## 2024-04-07 ENCOUNTER — Ambulatory Visit: Payer: Self-pay | Admitting: Family Medicine

## 2024-06-14 ENCOUNTER — Ambulatory Visit: Payer: Self-pay

## 2024-07-12 ENCOUNTER — Ambulatory Visit

## 2024-10-04 ENCOUNTER — Ambulatory Visit: Admitting: Family Medicine

## 2025-04-06 ENCOUNTER — Encounter: Admitting: Family Medicine
